# Patient Record
Sex: Female | Born: 1960 | Race: White | Hispanic: No | Smoking: Former smoker
Health system: Southern US, Community
[De-identification: ages and names within clinical notes are randomized; demographics above are authoritative.]

## PROBLEM LIST (undated history)

## (undated) ENCOUNTER — Emergency Department (HOSPITAL_COMMUNITY): Discharge status: Absent without leave | Payer: Medicaid Other

## (undated) DIAGNOSIS — Z8719 Personal history of other diseases of the digestive system: Secondary | ICD-10-CM

## (undated) DIAGNOSIS — M359 Systemic involvement of connective tissue, unspecified: Secondary | ICD-10-CM

## (undated) DIAGNOSIS — Z8673 Personal history of transient ischemic attack (TIA), and cerebral infarction without residual deficits: Secondary | ICD-10-CM

## (undated) DIAGNOSIS — Z9889 Other specified postprocedural states: Secondary | ICD-10-CM

## (undated) DIAGNOSIS — G8929 Other chronic pain: Secondary | ICD-10-CM

## (undated) DIAGNOSIS — B1920 Unspecified viral hepatitis C without hepatic coma: Secondary | ICD-10-CM

## (undated) DIAGNOSIS — F1910 Other psychoactive substance abuse, uncomplicated: Secondary | ICD-10-CM

## (undated) DIAGNOSIS — K219 Gastro-esophageal reflux disease without esophagitis: Secondary | ICD-10-CM

## (undated) DIAGNOSIS — M5431 Sciatica, right side: Secondary | ICD-10-CM

## (undated) DIAGNOSIS — N390 Urinary tract infection, site not specified: Secondary | ICD-10-CM

## (undated) DIAGNOSIS — I10 Essential (primary) hypertension: Secondary | ICD-10-CM

## (undated) DIAGNOSIS — E782 Mixed hyperlipidemia: Secondary | ICD-10-CM

## (undated) DIAGNOSIS — M1990 Unspecified osteoarthritis, unspecified site: Secondary | ICD-10-CM

## (undated) DIAGNOSIS — R7611 Nonspecific reaction to tuberculin skin test without active tuberculosis: Secondary | ICD-10-CM

## (undated) DIAGNOSIS — J45909 Unspecified asthma, uncomplicated: Secondary | ICD-10-CM

## (undated) DIAGNOSIS — G43909 Migraine, unspecified, not intractable, without status migrainosus: Secondary | ICD-10-CM

## (undated) DIAGNOSIS — F419 Anxiety disorder, unspecified: Secondary | ICD-10-CM

## (undated) DIAGNOSIS — F609 Personality disorder, unspecified: Secondary | ICD-10-CM

## (undated) HISTORY — PX: HEMORRHOID BANDING: SHX5850

## (undated) HISTORY — DX: Mixed hyperlipidemia: E78.2

## (undated) HISTORY — DX: Unspecified viral hepatitis C without hepatic coma: B19.20

## (undated) HISTORY — DX: Urinary tract infection, site not specified: N39.0

## (undated) HISTORY — DX: Other specified postprocedural states: Z98.890

## (undated) HISTORY — DX: Nonspecific reaction to tuberculin skin test without active tuberculosis: R76.11

## (undated) HISTORY — PX: CYSTOSCOPY: SUR368

## (undated) HISTORY — DX: Essential (primary) hypertension: I10

## (undated) HISTORY — DX: Anxiety disorder, unspecified: F41.9

## (undated) HISTORY — PX: OTHER SURGICAL HISTORY: SHX169

## (undated) HISTORY — DX: Other psychoactive substance abuse, uncomplicated: F19.10

## (undated) HISTORY — PX: TUBAL LIGATION: SHX77

## (undated) HISTORY — DX: Personality disorder, unspecified: F60.9

## (undated) HISTORY — PX: NOSE SURGERY: SHX723

## (undated) HISTORY — DX: Personal history of transient ischemic attack (TIA), and cerebral infarction without residual deficits: Z86.73

## (undated) HISTORY — DX: Gastro-esophageal reflux disease without esophagitis: K21.9

## (undated) HISTORY — PX: LIVER BIOPSY: SHX301

## (undated) HISTORY — DX: Sciatica, right side: M54.31

---

## 2000-07-09 ENCOUNTER — Ambulatory Visit (HOSPITAL_COMMUNITY): Admission: RE | Admit: 2000-07-09 | Discharge: 2000-07-09 | Payer: Self-pay | Admitting: Obstetrics and Gynecology

## 2000-07-09 ENCOUNTER — Encounter: Payer: Self-pay | Admitting: Obstetrics and Gynecology

## 2000-08-28 ENCOUNTER — Ambulatory Visit (HOSPITAL_COMMUNITY): Admission: RE | Admit: 2000-08-28 | Discharge: 2000-08-28 | Payer: Self-pay | Admitting: Pulmonary Disease

## 2000-09-04 ENCOUNTER — Ambulatory Visit (HOSPITAL_COMMUNITY): Admission: RE | Admit: 2000-09-04 | Discharge: 2000-09-04 | Payer: Self-pay | Admitting: Pulmonary Disease

## 2000-09-05 ENCOUNTER — Ambulatory Visit (HOSPITAL_COMMUNITY): Admission: RE | Admit: 2000-09-05 | Discharge: 2000-09-05 | Payer: Self-pay | Admitting: Pulmonary Disease

## 2001-12-08 ENCOUNTER — Encounter: Payer: Self-pay | Admitting: Internal Medicine

## 2001-12-08 ENCOUNTER — Emergency Department (HOSPITAL_COMMUNITY): Admission: EM | Admit: 2001-12-08 | Discharge: 2001-12-08 | Payer: Self-pay | Admitting: Internal Medicine

## 2002-05-10 ENCOUNTER — Emergency Department (HOSPITAL_COMMUNITY): Admission: EM | Admit: 2002-05-10 | Discharge: 2002-05-10 | Payer: Self-pay | Admitting: Internal Medicine

## 2004-04-13 ENCOUNTER — Ambulatory Visit (HOSPITAL_COMMUNITY): Admission: RE | Admit: 2004-04-13 | Discharge: 2004-04-13 | Payer: Self-pay | Admitting: Pulmonary Disease

## 2005-10-11 ENCOUNTER — Emergency Department (HOSPITAL_COMMUNITY): Admission: EM | Admit: 2005-10-11 | Discharge: 2005-10-11 | Payer: Self-pay | Admitting: Emergency Medicine

## 2007-07-29 DIAGNOSIS — Z9889 Other specified postprocedural states: Secondary | ICD-10-CM

## 2007-07-29 HISTORY — DX: Other specified postprocedural states: Z98.890

## 2007-08-06 ENCOUNTER — Ambulatory Visit: Payer: Self-pay | Admitting: Gastroenterology

## 2007-08-23 ENCOUNTER — Ambulatory Visit (HOSPITAL_COMMUNITY): Admission: RE | Admit: 2007-08-23 | Discharge: 2007-08-23 | Payer: Self-pay | Admitting: Internal Medicine

## 2007-08-23 ENCOUNTER — Ambulatory Visit: Payer: Self-pay | Admitting: Internal Medicine

## 2007-09-30 ENCOUNTER — Emergency Department (HOSPITAL_COMMUNITY): Admission: EM | Admit: 2007-09-30 | Discharge: 2007-09-30 | Payer: Self-pay | Admitting: Emergency Medicine

## 2008-06-10 ENCOUNTER — Emergency Department (HOSPITAL_COMMUNITY): Admission: EM | Admit: 2008-06-10 | Discharge: 2008-06-10 | Payer: Self-pay | Admitting: Emergency Medicine

## 2009-11-29 ENCOUNTER — Emergency Department (HOSPITAL_COMMUNITY): Admission: EM | Admit: 2009-11-29 | Discharge: 2009-11-29 | Payer: Self-pay | Admitting: Emergency Medicine

## 2010-02-27 DIAGNOSIS — B1920 Unspecified viral hepatitis C without hepatic coma: Secondary | ICD-10-CM

## 2010-02-27 HISTORY — DX: Unspecified viral hepatitis C without hepatic coma: B19.20

## 2010-05-12 LAB — DIFFERENTIAL
Basophils Relative: 0 % (ref 0–1)
Eosinophils Absolute: 0 10*3/uL (ref 0.0–0.7)
Monocytes Relative: 3 % (ref 3–12)
Neutrophils Relative %: 80 % — ABNORMAL HIGH (ref 43–77)

## 2010-05-12 LAB — URINALYSIS, ROUTINE W REFLEX MICROSCOPIC
Ketones, ur: >80 mg/dL — AB
Nitrite: NEGATIVE
pH: 6 (ref 5.0–8.0)

## 2010-05-12 LAB — POCT PREGNANCY, URINE: Preg Test, Ur: NEGATIVE

## 2010-05-12 LAB — COMPREHENSIVE METABOLIC PANEL
ALT: 19 U/L (ref 0–35)
Alkaline Phosphatase: 52 U/L (ref 39–117)
CO2: 19 mEq/L (ref 19–32)
Chloride: 99 mEq/L (ref 96–112)
GFR calc non Af Amer: 57 mL/min — ABNORMAL LOW (ref >60)
Glucose, Bld: 97 mg/dL (ref 70–99)
Potassium: 3.2 mEq/L — ABNORMAL LOW (ref 3.5–5.1)
Sodium: 138 mEq/L (ref 135–145)
Total Bilirubin: 1.1 mg/dL (ref 0.3–1.2)
Total Protein: 7.6 g/dL (ref 6.0–8.3)

## 2010-05-12 LAB — CBC
HCT: 45.8 % (ref 36.0–46.0)
Hemoglobin: 15.6 g/dL — ABNORMAL HIGH (ref 12.0–15.0)
RBC: 4.88 MIL/uL (ref 3.87–5.11)
WBC: 8.4 10*3/uL (ref 4.0–10.5)

## 2010-06-08 LAB — COMPREHENSIVE METABOLIC PANEL
BUN: 8 mg/dL (ref 6–23)
CO2: 29 mEq/L (ref 19–32)
Calcium: 9.7 mg/dL (ref 8.4–10.5)
Creatinine, Ser: 0.86 mg/dL (ref 0.4–1.2)
GFR calc non Af Amer: >60 mL/min (ref >60)
Glucose, Bld: 113 mg/dL — ABNORMAL HIGH (ref 70–99)

## 2010-06-08 LAB — CBC
Hemoglobin: 13.8 g/dL (ref 12.0–15.0)
MCHC: 34.9 g/dL (ref 30.0–36.0)
MCV: 89.2 fL (ref 78.0–100.0)
RBC: 4.43 MIL/uL (ref 3.87–5.11)

## 2010-06-08 LAB — DIFFERENTIAL
Lymphocytes Relative: 33 % (ref 12–46)
Lymphs Abs: 2.7 10*3/uL (ref 0.7–4.0)
Neutrophils Relative %: 60 % (ref 43–77)

## 2010-06-08 LAB — TSH: TSH: 3.033 u[IU]/mL (ref 0.350–4.500)

## 2010-07-12 NOTE — Op Note (Signed)
NAMEMAELIN, KURKOWSKI                ACCOUNT NO.:  [id number]   MEDICAL RECORD NO.:  [id number]          PATIENT TYPE:  AMB   LOCATION:  DAY                           FACILITY:  [facility]   PHYSICIAN:  R. Roetta Sessions, M.D. DATE OF BIRTH:  1960-06-11   DATE OF PROCEDURE:  08/23/2007  DATE OF DISCHARGE:                               OPERATIVE REPORT   INDICATIONS FOR PROCEDURE:  A 50 year old lady with several-year history  of intermittent rectal bleeding.  She is occasionally constipated.  She  has put off colonoscopy until now.  Risks, benefits, alternatives,  limitations of this procedure have been reviewed with [name].  Questions answered.  She is agreeable.  Please see the documentation in  the medical record.   PROCEDURE NOTE:  O2 saturation, blood pressure, pulse rate, and  respirations were monitored throughout the entire procedure.   CONSCIOUS SEDATION:  Versed 6 mg IV, Demerol 150 mg IV in divided doses,  Phenergan 25 mg diluted slow IV push to augment conscious sedation.   INSTRUMENT:  Pentax video chip system.   FINDINGS:  Digital rectal exam revealed no abnormalities.  Endoscopic  findings:  The prep was good.  Colon:  Colonic mucosa was surveyed from  the rectosigmoid junction through the left transverse, right colon,  appendiceal orifice, ileocecal valve, and cecum.  These structures were  well seen and photographed for the record.  Terminal ileum was intubated  to 10 cm from this level.  The scope was slowly and cautiously  withdrawn.  All previously mentioned mucosal surfaces were again seen.  The colonic mucosa appeared entirely normal as did the terminal ileal  mucosa.  The scope was pulled down to the rectum with examination of the  rectal mucosa including retroflex view of the anal verge, en face view  of the anal canal demonstrating only friable anal canal hemorrhoids. The  patient tolerated the procedure well and was reactive to endoscopy.   IMPRESSION:   Probably anal canal hemorrhoids, otherwise normal rectum,  terminal ileum.  I suspect the patient bled from hemorrhoids.   RECOMMENDATIONS:  1. Constipation, hemorrhoid literature is provided to [name].  She      should increase her fiber intake with daily      supplement.  Anusol-HC Suppositories one per rectum at bedtime x10      nights.  2. MiraLax 17 g orally at bedtime p.r.n. constipation.  3. Repeat colonoscopy for screening purposes in 10 years.      Jonathon Bellows, M.D.  Electronically Signed     RMR/MEDQ  D:  08/23/2007  T:  08/24/2007  Job:  [id number]

## 2010-07-12 NOTE — Consult Note (Signed)
NAMEJATZIRY, WECHTER                ACCOUNT NO.:  [id number]   MEDICAL RECORD NO.:  [id number]         PATIENT TYPE:  AMB   LOCATION:  DAY                           FACILITY:  [facility]   PHYSICIAN:  R. Roetta Sessions, M.D. DATE OF BIRTH:  [birth year]   DATE OF CONSULTATION:  DATE OF DISCHARGE:                                 CONSULTATION   REASON FOR CONSULTATION:  Rectal bleeding, needs colonoscopy.   PHYSICIAN REQUESTING CONSULTATION:  Edward L. Juanetta Gosling, M.D.   HISTORY OF PRESENT ILLNESS:  [name] is a 50 year old Caucasian  female, who has a several year history of intermittent rectal bleeding.  She states this has been going on for more than 2 years.  She has put  off having a colonoscopy.  Now, she is ready to have one, however.  She  states every time she has a bowel movement, she notes blood when she  wipes.  She often passes what she feels is a large amount of blood per  rectum.  It is usually mixed in the stools well.  Her stools are  anywhere from loose to formed.  At times, she does have constipation.  She states she has half diarrhea, half constipation on a regular basis.  She complains of crampy lower abdominal pain associated with bowel  movements.  The last couple of nights she has had nocturnal stools.  This was associated with vomiting at least on one occasion.  She also  has regular heartburn and takes Prilosec OTC.  She admits to a 60-pound  weight gain over the past year and a half due to poor diet.  She  denies any hematemesis.  She has never had a colonoscopy.  She states  she had colitis several years ago and was treated with medication.  She  denies ever having a colonoscopy or flex sig done.   CURRENT MEDICATIONS:  1. Xanax 1 mg t.i.d.  2. Vicodin 10 mg q.i.d.  3. Vistaril nightly.  4. Prilosec OTC p.r.n.   ALLERGIES:  PENICILLIN CAUSES RASH.   PAST MEDICAL HISTORY:  She has arthritis in her back and chronic back  pain, history of panic attacks, bipolar  disorder, anxiety, history of  colitis as above, details limited, prior tubal ligation.  She has had  surgery on her nose, left wrist, and left arm.   FAMILY HISTORY:  Mother is 73 years old, has dementia and heart  problems.  Father died due to alcoholism and also had prostate cancer.  She is not aware of any family history of colorectal cancer.   SOCIAL HISTORY:  She is separated.  She has a son, who is deceased.  She  smokes one pack of cigarettes daily.  No alcohol use since age 52, but  admits to about a 4-year history of daily alcohol use.   REVIEW OF SYSTEMS:  GI:  See HPI.  CONSTITUTIONAL:  See HPI.  CARDIOPULMONARY:  Denies chest pain or shortness of breath.  No cough or  palpitations.  GENITOURINARY:  Complains of stress urinary incontinence.  No dysuria or hematuria.   PHYSICAL EXAMINATION:  VITAL SIGNS:  Weight 172, height 5 feet 2 inches,  temp 98.4, blood pressure 128/88, and pulse 80.  GENERAL:  Pleasant, obese, Caucasian female in no acute distress.  SKIN:  Warm and dry.  No jaundice.  HEENT:  Sclerae nonicteric.  Oropharyngeal mucosa moist and pink.  No  lesions, erythema, or exudate.  No lymphadenopathy or thyromegaly.  CHEST:  Lungs are clear to auscultation.  CARDIAC:  Regular rate and rhythm.  Normal S1 and S2.  No murmurs, rubs,  or gallops.  ABDOMEN:  Positive bowel sounds.  Abdomen is soft and obese with  symmetrical mild lower abdominal tenderness to deep palpation, left  greater than right lower quadrant.  No rebound or guarding.  No  organomegaly or masses.  No abdominal bruits or hernias.  LOWER EXTREMITIES:  No edema.   IMPRESSION:  Ms. Jenita is a 50 year old lady with chronic hematochezia.  Differential diagnosis is quite broad anywhere from inflammatory bowel  disease, polyps, colorectal cancer, hemorrhoids etc. I have discussed  potential risk with regards to colonoscopy including, but not limited to  the risk of reaction to medication, bleeding,  infection, or perforation  and she is agreeable to proceed.   PLAN:  Colonoscopy with Dr. Jena Gauss in the near future.   I would like to thank Dr. Kari Baars for allowing [place] to take part in  the care of this patient.      Tana Coast, P.AJonathon Bellows, M.D.  Electronically Signed    LL/MEDQ  D:  08/06/2007  T:  08/07/2007  Job:  [id number]   cc:   Ramon Dredge L. Juanetta Gosling, M.D.  Fax: [phone]

## 2010-09-28 DIAGNOSIS — Z9889 Other specified postprocedural states: Secondary | ICD-10-CM

## 2010-09-28 HISTORY — DX: Other specified postprocedural states: Z98.890

## 2010-10-04 ENCOUNTER — Encounter: Payer: Self-pay | Admitting: Gastroenterology

## 2010-10-06 ENCOUNTER — Ambulatory Visit (INDEPENDENT_AMBULATORY_CARE_PROVIDER_SITE_OTHER): Payer: Self-pay | Admitting: Gastroenterology

## 2010-10-06 ENCOUNTER — Encounter: Payer: Self-pay | Admitting: Gastroenterology

## 2010-10-06 DIAGNOSIS — R1013 Epigastric pain: Secondary | ICD-10-CM

## 2010-10-06 DIAGNOSIS — K921 Melena: Secondary | ICD-10-CM

## 2010-10-06 DIAGNOSIS — R112 Nausea with vomiting, unspecified: Secondary | ICD-10-CM | POA: Insufficient documentation

## 2010-10-06 NOTE — Assessment & Plan Note (Signed)
See N/V 

## 2010-10-06 NOTE — Progress Notes (Signed)
agree

## 2010-10-06 NOTE — Assessment & Plan Note (Signed)
Low-volume, intermittent paper hematochezia in the setting of known anal canal hemorrhoids. No change in bowel habits, lower abdominal pain, no FH of colon ca. Last colonoscopy fairly recent in June 2009. No anemia on labs. At this time, will hold off on colonoscopy at time of EGD. Pt may likely need one in the distant future, especially if any clinical changes. She states intermittent episodes are relieved with OTC Preparation H. Due to chronic N/V, will address this first, follow-up in office, and determine timing of next colonoscopy if needed.

## 2010-10-06 NOTE — Progress Notes (Signed)
[facility] 

## 2010-10-06 NOTE — Patient Instructions (Signed)
Continue Omeprazole daily, taking 30 minutes before breakfast each morning.  Eat soft foods, drink liquids/water to stay hydrated.  If you experience any increased abdominal pain, worsened nausea or vomiting, inability to tolerate anything at all by mouth, bloody vomit, then you need to go the nearest emergency room.  We have set you up for an endoscopy with Dr. Jena Gauss. Further recommendations to follow.
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
## 2010-10-06 NOTE — Assessment & Plan Note (Signed)
49 year old Caucasian female, in no apparent distress, with reported hx of chronic N/V since April of this year. Associated with intense, intermittent epigastric pain. Worsened with eating. No hematemesis, no wt loss or lack of appetite. Interestingly, has tolerated foods such as pork chops, mashed potatoes recently. Drinking tea at today's visit. Her labs are essentially normal, without anemia or signs of dehydration. She has had improvement in reflux-type symptoms since taking Prilosec daily. Differentials include gastritis, PUD, uncontrolled GERD, less likely biliary component but unable to be entirely excluded. Doubt gastroparesis, especially in light of epigastric pain. Pt is in no distress at time of visit.   ~Proceed with upper endoscopy, possible dilation if needed, in the near future with Dr. Jena Gauss. The risks, benefits, and alternatives have been discussed in detail with patient. They have stated understanding and desire to proceed.  ~Continue prilosec daily, samples provided ~To ED if worsening of symptoms, inability to tolerate liquids. Pt thoroughly informed on signs/symptoms that would necessitate urgent intervention. ~Further recommendations to follow

## 2010-10-07 ENCOUNTER — Encounter: Payer: Self-pay | Admitting: General Practice

## 2010-10-07 ENCOUNTER — Other Ambulatory Visit: Payer: Self-pay | Admitting: General Practice

## 2010-10-07 DIAGNOSIS — R1013 Epigastric pain: Secondary | ICD-10-CM

## 2010-10-07 DIAGNOSIS — R112 Nausea with vomiting, unspecified: Secondary | ICD-10-CM

## 2010-10-14 MED ORDER — SODIUM CHLORIDE 0.45 % IV SOLN
Freq: Once | INTRAVENOUS | Status: AC
  Administered 2010-10-17: 08:00:00 via INTRAVENOUS

## 2010-10-17 ENCOUNTER — Encounter (HOSPITAL_COMMUNITY)
Admission: RE | Disposition: A | Discharge status: Home / Self Care | Payer: Self-pay | Source: Ambulatory Visit | Attending: Internal Medicine

## 2010-10-17 ENCOUNTER — Encounter (HOSPITAL_COMMUNITY): Payer: Self-pay | Admitting: *Deleted

## 2010-10-17 ENCOUNTER — Ambulatory Visit (HOSPITAL_COMMUNITY)
Admission: RE | Admit: 2010-10-17 | Discharge: 2010-10-17 | Disposition: A | Discharge status: Home / Self Care | Payer: Self-pay | Source: Ambulatory Visit | Attending: Internal Medicine | Admitting: Internal Medicine

## 2010-10-17 ENCOUNTER — Telehealth: Payer: Self-pay

## 2010-10-17 DIAGNOSIS — E7800 Pure hypercholesterolemia, unspecified: Secondary | ICD-10-CM | POA: Insufficient documentation

## 2010-10-17 DIAGNOSIS — R112 Nausea with vomiting, unspecified: Secondary | ICD-10-CM

## 2010-10-17 DIAGNOSIS — K228 Other specified diseases of esophagus: Secondary | ICD-10-CM

## 2010-10-17 DIAGNOSIS — K449 Diaphragmatic hernia without obstruction or gangrene: Secondary | ICD-10-CM | POA: Insufficient documentation

## 2010-10-17 DIAGNOSIS — R1013 Epigastric pain: Secondary | ICD-10-CM

## 2010-10-17 HISTORY — DX: Migraine, unspecified, not intractable, without status migrainosus: G43.909

## 2010-10-17 HISTORY — PX: ESOPHAGOGASTRODUODENOSCOPY: SHX5428

## 2010-10-17 HISTORY — PX: MALONEY DILATION: SHX5535

## 2010-10-17 HISTORY — PX: SAVORY DILATION: SHX5439

## 2010-10-17 SURGERY — EGD (ESOPHAGOGASTRODUODENOSCOPY)
Anesthesia: Moderate Sedation

## 2010-10-17 MED ORDER — MIDAZOLAM HCL 5 MG/5ML IJ SOLN
INTRAMUSCULAR | Status: AC
  Filled 2010-10-17: qty 5

## 2010-10-17 MED ORDER — MEPERIDINE HCL 100 MG/ML IJ SOLN
INTRAMUSCULAR | Status: DC | PRN
  Administered 2010-10-17 (×3): 50 mg via INTRAVENOUS
  Administered 2010-10-17 (×2): 25 mg via INTRAVENOUS

## 2010-10-17 MED ORDER — STERILE WATER FOR IRRIGATION IR SOLN
Status: DC | PRN
  Administered 2010-10-17: 09:00:00

## 2010-10-17 MED ORDER — MEPERIDINE HCL 100 MG/ML IJ SOLN
INTRAMUSCULAR | Status: AC
  Filled 2010-10-17: qty 1

## 2010-10-17 MED ORDER — PROMETHAZINE HCL 25 MG/ML IJ SOLN
INTRAMUSCULAR | Status: AC
  Filled 2010-10-17: qty 1

## 2010-10-17 MED ORDER — MIDAZOLAM HCL 5 MG/5ML IJ SOLN
INTRAMUSCULAR | Status: AC
  Filled 2010-10-17: qty 10

## 2010-10-17 MED ORDER — PROMETHAZINE HCL 25 MG/ML IJ SOLN
INTRAMUSCULAR | Status: DC | PRN
  Administered 2010-10-17: 12.5 mg via INTRAVENOUS

## 2010-10-17 MED ORDER — BUTAMBEN-TETRACAINE-BENZOCAINE 2-2-14 % EX AERO
INHALATION_SPRAY | CUTANEOUS | Status: DC | PRN
  Administered 2010-10-17 (×2): 1 via TOPICAL

## 2010-10-17 MED ORDER — MIDAZOLAM HCL 5 MG/5ML IJ SOLN
INTRAMUSCULAR | Status: DC | PRN
  Administered 2010-10-17: 2 mg via INTRAVENOUS
  Administered 2010-10-17: 1 mg via INTRAVENOUS
  Administered 2010-10-17: 2 mg via INTRAVENOUS
  Administered 2010-10-17: 1 mg via INTRAVENOUS
  Administered 2010-10-17: 2 mg via INTRAVENOUS

## 2010-10-17 NOTE — Telephone Encounter (Signed)
Dr.Rourk called and asked to give patient 3 weeks of Dexilant. She will stop by on her way home from the hospital and I will leave them up front.
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
## 2010-10-20 ENCOUNTER — Encounter (HOSPITAL_COMMUNITY): Payer: Self-pay | Admitting: Internal Medicine

## 2010-10-20 ENCOUNTER — Ambulatory Visit (HOSPITAL_COMMUNITY): Payer: Self-pay

## 2010-10-25 ENCOUNTER — Ambulatory Visit (HOSPITAL_COMMUNITY)
Admission: RE | Admit: 2010-10-25 | Discharge: 2010-10-25 | Disposition: A | Discharge status: Home / Self Care | Payer: Self-pay | Source: Ambulatory Visit | Attending: Internal Medicine | Admitting: Internal Medicine

## 2010-10-25 DIAGNOSIS — R109 Unspecified abdominal pain: Secondary | ICD-10-CM | POA: Insufficient documentation

## 2010-10-25 DIAGNOSIS — R1013 Epigastric pain: Secondary | ICD-10-CM

## 2010-10-27 NOTE — Progress Notes (Signed)
Quick Note:  Normal. How is pt? ______

## 2010-11-03 ENCOUNTER — Other Ambulatory Visit: Payer: Self-pay | Admitting: Internal Medicine

## 2010-11-03 ENCOUNTER — Telehealth: Payer: Self-pay

## 2010-11-03 ENCOUNTER — Encounter: Payer: Self-pay | Admitting: Internal Medicine

## 2010-11-03 NOTE — Telephone Encounter (Signed)
Pt is aware of OV for 10/11 at 10 with AS

## 2010-11-03 NOTE — Telephone Encounter (Signed)
She needs a follow up appointment with one of the extenders

## 2010-11-03 NOTE — Telephone Encounter (Signed)
Please schedule appt for pt. thanks 

## 2010-11-03 NOTE — Telephone Encounter (Signed)
FYI- When I called pt yesterday to discuss U/S results, pt informed me that [facility] and [facility] told her that she had hep c but it was not active and [facility] might want to check her liver. [facility] and [facility] Malachi Paradise[facility] She stated she checked pts chart and there is nothing current or in her history showing pt has hep c. She stated she didn't know where the pt got that info from.

## 2010-11-09 NOTE — Telephone Encounter (Signed)
Pt stated [facility] found her Hep C. Will you please ask them to send [place] any info they have. Thanks.

## 2010-11-10 NOTE — Telephone Encounter (Signed)
Records Requested.

## 2010-12-08 ENCOUNTER — Ambulatory Visit (INDEPENDENT_AMBULATORY_CARE_PROVIDER_SITE_OTHER): Payer: Self-pay | Admitting: Gastroenterology

## 2010-12-08 ENCOUNTER — Encounter: Payer: Self-pay | Admitting: Gastroenterology

## 2010-12-08 VITALS — BP 127/80 | HR 100 | Temp 98.1°F | Ht 62.0 in | Wt 163.2 lb

## 2010-12-08 DIAGNOSIS — R768 Other specified abnormal immunological findings in serum: Secondary | ICD-10-CM

## 2010-12-08 DIAGNOSIS — Z205 Contact with and (suspected) exposure to viral hepatitis: Secondary | ICD-10-CM

## 2010-12-08 DIAGNOSIS — R894 Abnormal immunological findings in specimens from other organs, systems and tissues: Secondary | ICD-10-CM

## 2010-12-08 DIAGNOSIS — Z20828 Contact with and (suspected) exposure to other viral communicable diseases: Secondary | ICD-10-CM

## 2010-12-08 DIAGNOSIS — R631 Polydipsia: Secondary | ICD-10-CM

## 2010-12-08 DIAGNOSIS — R1013 Epigastric pain: Secondary | ICD-10-CM

## 2010-12-08 MED ORDER — PROMETHAZINE HCL 25 MG PO TABS: 25.0000 mg | ORAL_TABLET | Freq: Four times a day (QID) | ORAL | Status: DC | PRN

## 2010-12-08 MED ORDER — DEXLANSOPRAZOLE 60 MG PO CPDR: 60.0000 mg | DELAYED_RELEASE_CAPSULE | Freq: Every day | ORAL | Status: DC

## 2010-12-08 NOTE — Patient Instructions (Signed)
Please have labs drawn. We will call you with results.  Continue Dexilant daily. A prescription has been provided to assist with coverage for medication.  We will see you back in 6 months or sooner if needed.
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
## 2010-12-09 LAB — HEPATIC FUNCTION PANEL
ALT: 20 U/L (ref 0–35)
Alkaline Phosphatase: 72 U/L (ref 39–117)
Indirect Bilirubin: 0.2 mg/dL (ref 0.0–0.9)
Total Protein: 7.3 g/dL (ref 6.0–8.3)

## 2010-12-12 ENCOUNTER — Encounter: Payer: Self-pay | Admitting: Gastroenterology

## 2010-12-12 DIAGNOSIS — B1920 Unspecified viral hepatitis C without hepatic coma: Secondary | ICD-10-CM | POA: Insufficient documentation

## 2010-12-12 DIAGNOSIS — R631 Polydipsia: Secondary | ICD-10-CM | POA: Insufficient documentation

## 2010-12-12 NOTE — Assessment & Plan Note (Signed)
Resolved. Doing well on Dexilant daily. Requested phenergan while working on [employer], drilling approved. Will provide short course. Dexilant samples provided as well. Return in 6 mos.

## 2010-12-12 NOTE — Progress Notes (Unsigned)
Received labs from [facility].   Hep C antibody +.  Need quantitative HCV RNA.   Depending on results, may refer to [facility] for possible treatment.

## 2010-12-12 NOTE — Assessment & Plan Note (Signed)
Received reports. Will be drawing quantitative RNA. Update HFP. Recent US on file from Aug, no abnormalities. If needed, refer to [facility].   6 mos f/u.

## 2010-12-12 NOTE — Assessment & Plan Note (Signed)
Increased thirst, hunger, worried about her blood sugars. Will draw A1c, send to [facility] results.

## 2010-12-13 ENCOUNTER — Other Ambulatory Visit: Payer: Self-pay | Admitting: Gastroenterology

## 2010-12-13 DIAGNOSIS — R768 Other specified abnormal immunological findings in serum: Secondary | ICD-10-CM

## 2010-12-13 NOTE — Progress Notes (Signed)
Pt aware, lab order mailed to pt.  

## 2010-12-13 NOTE — Progress Notes (Signed)
Quick Note:  This all looks great. No diabetes. Needs to f/u with PCP regarding her symptoms. Awaiting quantitative HCV RNA to determine plan. ______

## 2010-12-14 NOTE — Progress Notes (Signed)
Cc to PCP 

## 2010-12-15 ENCOUNTER — Other Ambulatory Visit: Payer: Self-pay | Admitting: Gastroenterology

## 2010-12-16 LAB — HEPATITIS C RNA QUANTITATIVE: HCV Quantitative: 3130000 IU/mL — ABNORMAL HIGH (ref <43)

## 2010-12-20 NOTE — Progress Notes (Signed)
Quick Note:  Please contact pt and inform we did the quantitative labs to assess for active Hep C. She needs to be referred to the Hep C clinic in [place] for treatment. ______

## 2010-12-26 ENCOUNTER — Telehealth: Payer: Self-pay | Admitting: Gastroenterology

## 2010-12-26 NOTE — Telephone Encounter (Signed)
Received confirmation fax from [facility] Clinic- they will contact pt regarding scheduling appt

## 2011-02-07 ENCOUNTER — Other Ambulatory Visit: Payer: Self-pay

## 2011-02-07 DIAGNOSIS — R894 Abnormal immunological findings in specimens from other organs, systems and tissues: Secondary | ICD-10-CM

## 2011-02-07 NOTE — Telephone Encounter (Signed)
Pt is aware of lab work that needs to be done. She is on Dexilant.

## 2011-02-07 NOTE — Telephone Encounter (Signed)
Refused Phenergan refill. Pt should already be on Dexilant. She was given short-term rx until Dexilant approved.  Also, needs AFP now. Will need AFP and [place] of abd q6 mos. Next due Feb for [place].

## 2011-02-13 ENCOUNTER — Other Ambulatory Visit: Payer: Self-pay | Admitting: Gastroenterology

## 2011-02-16 NOTE — Progress Notes (Signed)
Quick Note:  Normal. Repeat 6 mos. ______

## 2011-03-14 ENCOUNTER — Telehealth: Payer: Self-pay | Admitting: Gastroenterology

## 2011-03-14 NOTE — Telephone Encounter (Signed)
Received fax from Hep C clinic- pt is scheduled for 02/28 @ 10:45- pt is aware of appt

## 2011-03-21 ENCOUNTER — Other Ambulatory Visit: Payer: Self-pay | Admitting: Obstetrics and Gynecology

## 2011-03-21 DIAGNOSIS — N920 Excessive and frequent menstruation with regular cycle: Secondary | ICD-10-CM

## 2011-03-24 ENCOUNTER — Other Ambulatory Visit: Payer: Self-pay | Admitting: Obstetrics and Gynecology

## 2011-03-24 ENCOUNTER — Ambulatory Visit (HOSPITAL_COMMUNITY)
Admission: RE | Admit: 2011-03-24 | Discharge: 2011-03-24 | Disposition: A | Discharge status: Home / Self Care | Payer: Self-pay | Source: Ambulatory Visit | Attending: Obstetrics and Gynecology | Admitting: Obstetrics and Gynecology

## 2011-03-24 DIAGNOSIS — R9389 Abnormal findings on diagnostic imaging of other specified body structures: Secondary | ICD-10-CM | POA: Insufficient documentation

## 2011-03-24 DIAGNOSIS — N83209 Unspecified ovarian cyst, unspecified side: Secondary | ICD-10-CM | POA: Insufficient documentation

## 2011-03-24 DIAGNOSIS — N949 Unspecified condition associated with female genital organs and menstrual cycle: Secondary | ICD-10-CM | POA: Insufficient documentation

## 2011-03-24 DIAGNOSIS — N938 Other specified abnormal uterine and vaginal bleeding: Secondary | ICD-10-CM | POA: Insufficient documentation

## 2011-03-24 DIAGNOSIS — N920 Excessive and frequent menstruation with regular cycle: Secondary | ICD-10-CM

## 2011-04-27 ENCOUNTER — Ambulatory Visit (INDEPENDENT_AMBULATORY_CARE_PROVIDER_SITE_OTHER): Payer: Self-pay | Admitting: Gastroenterology

## 2011-04-27 DIAGNOSIS — B182 Chronic viral hepatitis C: Secondary | ICD-10-CM

## 2011-04-28 LAB — COMPLETE METABOLIC PANEL WITH GFR
ALT: 17 U/L (ref 0–35)
AST: 19 U/L (ref 0–37)
Albumin: 4.1 g/dL (ref 3.5–5.2)
Alkaline Phosphatase: 61 U/L (ref 39–117)
GFR, Est Non African American: 81 mL/min
Glucose, Bld: 100 mg/dL — ABNORMAL HIGH (ref 70–99)
Potassium: 4.2 mEq/L (ref 3.5–5.3)
Sodium: 136 mEq/L (ref 135–145)
Total Protein: 6.9 g/dL (ref 6.0–8.3)

## 2011-04-28 LAB — CBC WITH DIFFERENTIAL/PLATELET
Basophils Relative: 0 % (ref 0–1)
Hemoglobin: 10.2 g/dL — ABNORMAL LOW (ref 12.0–15.0)
MCHC: 30.4 g/dL (ref 30.0–36.0)
Monocytes Relative: 3 % (ref 3–12)
Neutro Abs: 6.5 10*3/uL (ref 1.7–7.7)
Neutrophils Relative %: 75 % (ref 43–77)
Platelets: 274 10*3/uL (ref 150–400)
RBC: 4.38 MIL/uL (ref 3.87–5.11)

## 2011-04-28 LAB — AFP TUMOR MARKER: AFP-Tumor Marker: 1.6 ng/mL (ref 0.0–8.0)

## 2011-04-28 LAB — HEPATITIS B CORE ANTIBODY, TOTAL: Hep B Core Total Ab: POSITIVE — AB

## 2011-04-28 LAB — IBC PANEL
%SAT: 8 % — ABNORMAL LOW (ref 20–55)
TIBC: 468 ug/dL (ref 250–470)
UIBC: 432 ug/dL — ABNORMAL HIGH (ref 125–400)

## 2011-04-28 LAB — HEPATITIS B SURFACE ANTIBODY,QUALITATIVE: Hep B S Ab: POSITIVE — AB

## 2011-04-28 LAB — ANA: Anti Nuclear Antibody(ANA): NEGATIVE

## 2011-05-03 LAB — HEPATITIS C GENOTYPE

## 2011-05-04 ENCOUNTER — Other Ambulatory Visit: Payer: Self-pay | Admitting: Gastroenterology

## 2011-05-04 DIAGNOSIS — B182 Chronic viral hepatitis C: Secondary | ICD-10-CM
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
## 2011-05-05 ENCOUNTER — Encounter (HOSPITAL_COMMUNITY): Payer: Self-pay | Admitting: Pharmacy Technician

## 2011-05-10 ENCOUNTER — Ambulatory Visit (INDEPENDENT_AMBULATORY_CARE_PROVIDER_SITE_OTHER): Payer: Self-pay | Admitting: Urgent Care

## 2011-05-10 ENCOUNTER — Telehealth: Payer: Self-pay | Admitting: Gastroenterology

## 2011-05-10 ENCOUNTER — Encounter: Payer: Self-pay | Admitting: Urgent Care

## 2011-05-10 VITALS — BP 129/80 | HR 108 | Temp 97.2°F | Wt 168.4 lb

## 2011-05-10 DIAGNOSIS — K921 Melena: Secondary | ICD-10-CM

## 2011-05-10 DIAGNOSIS — D509 Iron deficiency anemia, unspecified: Secondary | ICD-10-CM

## 2011-05-10 DIAGNOSIS — N912 Amenorrhea, unspecified: Secondary | ICD-10-CM

## 2011-05-10 DIAGNOSIS — R894 Abnormal immunological findings in specimens from other organs, systems and tissues: Secondary | ICD-10-CM

## 2011-05-10 DIAGNOSIS — R9389 Abnormal findings on diagnostic imaging of other specified body structures: Secondary | ICD-10-CM

## 2011-05-10 DIAGNOSIS — R768 Other specified abnormal immunological findings in serum: Secondary | ICD-10-CM

## 2011-05-10 NOTE — Telephone Encounter (Signed)
Referral faxed to [facility]

## 2011-05-10 NOTE — Assessment & Plan Note (Signed)
Given the iron deficiency anemia she is going to need another colonoscopy to rule out colorectal neoplasia.  I have discussed risks & benefits which include, but are not limited to, bleeding, infection, perforation & drug reaction.  The patient agrees with this plan & written consent will be obtained.

## 2011-05-10 NOTE — Progress Notes (Signed)
Faxed to PCP

## 2011-05-10 NOTE — Assessment & Plan Note (Signed)
liver biopsy 3/22 pending

## 2011-05-10 NOTE — Assessment & Plan Note (Signed)
She is planning colonoscopy and possibly EGD if no source of iron deficiency anemia found on colonoscopy.I have discussed risks & benefits which include, but are not limited to, bleeding, infection, perforation & drug reaction.  The patient agrees with this plan & written consent will be obtained.    She also needs follow up with Dr. Emelda Fear regarding her endometrial thickening and possible polyp.  Malignancy needs to be ruled out here as well.

## 2011-05-10 NOTE — Patient Instructions (Signed)
You need colonoscopy and possibly an EGD with Dr. Jena Gauss in the near future Keep follow up w/ Dr Jacqualine Mau for liver biopsy Please see Dr Emelda Fear to follow up with your ultrasound findings
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
## 2011-05-16 ENCOUNTER — Other Ambulatory Visit: Payer: Self-pay | Admitting: Radiology

## 2011-05-19 ENCOUNTER — Encounter (HOSPITAL_COMMUNITY): Payer: Self-pay

## 2011-05-19 ENCOUNTER — Ambulatory Visit (HOSPITAL_COMMUNITY)
Admission: RE | Admit: 2011-05-19 | Discharge: 2011-05-19 | Disposition: A | Discharge status: Home / Self Care | Payer: Self-pay | Source: Ambulatory Visit | Attending: Gastroenterology | Admitting: Gastroenterology

## 2011-05-19 DIAGNOSIS — F411 Generalized anxiety disorder: Secondary | ICD-10-CM | POA: Insufficient documentation

## 2011-05-19 DIAGNOSIS — K219 Gastro-esophageal reflux disease without esophagitis: Secondary | ICD-10-CM | POA: Insufficient documentation

## 2011-05-19 DIAGNOSIS — B182 Chronic viral hepatitis C: Secondary | ICD-10-CM

## 2011-05-19 DIAGNOSIS — B1920 Unspecified viral hepatitis C without hepatic coma: Secondary | ICD-10-CM | POA: Insufficient documentation

## 2011-05-19 DIAGNOSIS — F17200 Nicotine dependence, unspecified, uncomplicated: Secondary | ICD-10-CM | POA: Insufficient documentation

## 2011-05-19 LAB — PROTIME-INR
INR: 0.94 (ref 0.00–1.49)
Prothrombin Time: 12.8 seconds (ref 11.6–15.2)

## 2011-05-19 LAB — CBC
Hemoglobin: 10.3 g/dL — ABNORMAL LOW (ref 12.0–15.0)
MCH: 23.6 pg — ABNORMAL LOW (ref 26.0–34.0)
MCHC: 31.6 g/dL (ref 30.0–36.0)
Platelets: 183 10*3/uL (ref 150–400)
RBC: 4.37 MIL/uL (ref 3.87–5.11)

## 2011-05-19 LAB — APTT: aPTT: 29 seconds (ref 24–37)

## 2011-05-19 MED ORDER — HYDROCODONE-ACETAMINOPHEN 5-325 MG PO TABS
1.0000 | ORAL_TABLET | ORAL | Status: DC | PRN
  Administered 2011-05-19: 1 via ORAL
  Filled 2011-05-19: qty 1

## 2011-05-19 MED ORDER — FENTANYL CITRATE 0.05 MG/ML IJ SOLN
INTRAMUSCULAR | Status: AC
  Filled 2011-05-19: qty 4

## 2011-05-19 MED ORDER — MIDAZOLAM HCL 5 MG/5ML IJ SOLN
INTRAMUSCULAR | Status: AC | PRN
  Administered 2011-05-19 (×2): 2 mg via INTRAVENOUS

## 2011-05-19 MED ORDER — ALPRAZOLAM 1 MG PO TABS
1.0000 mg | ORAL_TABLET | Freq: Once | ORAL | Status: DC
  Filled 2011-05-19: qty 1

## 2011-05-19 MED ORDER — FENTANYL CITRATE 0.05 MG/ML IJ SOLN
INTRAMUSCULAR | Status: AC | PRN
  Administered 2011-05-19 (×3): 50 ug via INTRAVENOUS

## 2011-05-19 MED ORDER — ALPRAZOLAM 0.25 MG PO TABS
ORAL_TABLET | ORAL | Status: AC
  Administered 2011-05-19: 1 mg
  Filled 2011-05-19: qty 4

## 2011-05-19 MED ORDER — MIDAZOLAM HCL 2 MG/2ML IJ SOLN
INTRAMUSCULAR | Status: AC
  Filled 2011-05-19: qty 4

## 2011-05-19 MED ORDER — SODIUM CHLORIDE 0.9 % IV SOLN
Freq: Once | INTRAVENOUS | Status: AC
  Administered 2011-05-19: 09:00:00 via INTRAVENOUS

## 2011-05-19 NOTE — ED Notes (Signed)
O2 2L [place] 

## 2011-05-19 NOTE — Procedures (Signed)
Technically successful US guided biopsy of right lobe of liver.  No immediate complications.   

## 2011-05-19 NOTE — ED Notes (Signed)
Pt to nurses station awaiting bed placement on short stay

## 2011-05-19 NOTE — Discharge Instructions (Signed)
Liver Biopsy Care After These instructions give you information on caring for yourself after your procedure. Your doctor may also give you more specific instructions. Call your doctor if you have any problems or questions after your procedure. HOME CARE  Watch for bleeding at your biopsy site.   No heavy lifting, pushing, or pulling for 48 hours (2 days).   No exercise, jogging, or sex for 48 hours (2 days).   Do not drive or use heavy machinery for 24 hours (1 day).   Go back to your usual diet and medicines as told by your doctor.   Do not take the bandage off until the next morning.   Only take medicine as told by your doctor.   Do not shower or bathe until the next day.  GET HELP RIGHT AWAY IF:  You have shortness of breath or trouble breathing.   You have pain or cramping in your belly (abdomen).   You feel sick to your stomach (nauseous) or throw up (vomit).   Bleeding does not stop from the place where the needle was put in. Press on the place that is bleeding until you are checked in the Emergency Room.   Yellowish white fluid (pus) is coming from the place where the needle was put in.   You have any unusual pain that will not stop.   You have puffiness (swelling) or redness at the place where the needle was put in, or if the place is very sore or hot when you touch it.   You have a fever of more than 102 F (38.9 C) for 2 or more days.   You have black, smelly poops (bowel movements).  If you go to the Emergency Room, tell the nurse that you had a liver biopsy. Take this paper with you and show it to the nurse. Keep your follow-up appointment. MAKE SURE YOU:  Understand these instructions.   Will watch your condition.   Will get help right away if you are not doing well or get worse.  Document Released: 11/23/2007 Document Revised: 02/02/2011 Document Reviewed: 11/23/2007 [employer] Patient Information 2012 [employer]. 
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
## 2011-05-22 ENCOUNTER — Telehealth (HOSPITAL_COMMUNITY): Payer: Self-pay

## 2011-05-23 MED ORDER — SODIUM CHLORIDE 0.45 % IV SOLN
Freq: Once | INTRAVENOUS | Status: AC
  Administered 2011-05-24: 09:00:00 via INTRAVENOUS

## 2011-05-24 ENCOUNTER — Encounter (HOSPITAL_COMMUNITY): Payer: Self-pay | Admitting: *Deleted

## 2011-05-24 ENCOUNTER — Encounter (HOSPITAL_COMMUNITY)
Admission: RE | Disposition: A | Discharge status: Home / Self Care | Payer: Self-pay | Source: Ambulatory Visit | Attending: Internal Medicine

## 2011-05-24 ENCOUNTER — Ambulatory Visit (HOSPITAL_COMMUNITY)
Admission: RE | Admit: 2011-05-24 | Discharge: 2011-05-24 | Disposition: A | Discharge status: Home / Self Care | Payer: Self-pay | Source: Ambulatory Visit | Attending: Internal Medicine | Admitting: Internal Medicine

## 2011-05-24 DIAGNOSIS — D509 Iron deficiency anemia, unspecified: Secondary | ICD-10-CM

## 2011-05-24 DIAGNOSIS — K921 Melena: Secondary | ICD-10-CM

## 2011-05-24 DIAGNOSIS — R9389 Abnormal findings on diagnostic imaging of other specified body structures: Secondary | ICD-10-CM

## 2011-05-24 DIAGNOSIS — D126 Benign neoplasm of colon, unspecified: Secondary | ICD-10-CM

## 2011-05-24 DIAGNOSIS — E7800 Pure hypercholesterolemia, unspecified: Secondary | ICD-10-CM | POA: Insufficient documentation

## 2011-05-24 DIAGNOSIS — K644 Residual hemorrhoidal skin tags: Secondary | ICD-10-CM | POA: Insufficient documentation

## 2011-05-24 DIAGNOSIS — K449 Diaphragmatic hernia without obstruction or gangrene: Secondary | ICD-10-CM | POA: Insufficient documentation

## 2011-05-24 DIAGNOSIS — K5730 Diverticulosis of large intestine without perforation or abscess without bleeding: Secondary | ICD-10-CM

## 2011-05-24 DIAGNOSIS — R768 Other specified abnormal immunological findings in serum: Secondary | ICD-10-CM

## 2011-05-24 HISTORY — PX: COLONOSCOPY: SHX5424

## 2011-05-24 HISTORY — PX: ESOPHAGOGASTRODUODENOSCOPY: SHX5428

## 2011-05-24 SURGERY — COLONOSCOPY
Anesthesia: Moderate Sedation

## 2011-05-24 MED ORDER — PROMETHAZINE HCL 25 MG/ML IJ SOLN
INTRAMUSCULAR | Status: AC
  Filled 2011-05-24: qty 1

## 2011-05-24 MED ORDER — MEPERIDINE HCL 100 MG/ML IJ SOLN
INTRAMUSCULAR | Status: DC | PRN
  Administered 2011-05-24 (×3): 25 mg via INTRAVENOUS
  Administered 2011-05-24 (×2): 50 mg via INTRAVENOUS

## 2011-05-24 MED ORDER — BUTAMBEN-TETRACAINE-BENZOCAINE 2-2-14 % EX AERO
INHALATION_SPRAY | CUTANEOUS | Status: DC | PRN
  Administered 2011-05-24: 2 via TOPICAL

## 2011-05-24 MED ORDER — MIDAZOLAM HCL 5 MG/5ML IJ SOLN
INTRAMUSCULAR | Status: AC
  Filled 2011-05-24: qty 10

## 2011-05-24 MED ORDER — STERILE WATER FOR IRRIGATION IR SOLN
Status: DC | PRN
  Administered 2011-05-24: 10:00:00

## 2011-05-24 MED ORDER — MEPERIDINE HCL 100 MG/ML IJ SOLN
INTRAMUSCULAR | Status: AC
  Filled 2011-05-24: qty 2

## 2011-05-24 MED ORDER — PROMETHAZINE HCL 25 MG/ML IJ SOLN
25.0000 mg | Freq: Once | INTRAMUSCULAR | Status: AC
  Administered 2011-05-24: 25 mg via INTRAVENOUS

## 2011-05-24 MED ORDER — MIDAZOLAM HCL 5 MG/5ML IJ SOLN
INTRAMUSCULAR | Status: DC | PRN
  Administered 2011-05-24: 1 mg via INTRAVENOUS
  Administered 2011-05-24 (×3): 2 mg via INTRAVENOUS
  Administered 2011-05-24 (×3): 1 mg via INTRAVENOUS

## 2011-05-24 MED ORDER — PROMETHAZINE HCL 25 MG/ML IJ SOLN: 12.5000 mg | Freq: Once | INTRAMUSCULAR | Status: DC

## 2011-05-24 MED ORDER — SODIUM CHLORIDE 0.9 % IJ SOLN
INTRAMUSCULAR | Status: AC
  Filled 2011-05-24: qty 10
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
## 2011-05-24 NOTE — Op Note (Signed)
[facility] [address] [place], [place]  [address]  COLONOSCOPY PROCEDURE REPORT  PATIENT:  [name], [name]  MR#:  [id number] BIRTHDATE:  03/29/1960, 50 yrs. old  GENDER:  female ENDOSCOPIST:  R. Roetta Sessions, MD FACP [facility] REF. BY:  Kari Baars, M.D. PROCEDURE DATE:  05/24/2011 PROCEDURE:  Ileocolonoscopy with biopsy  INDICATIONS:  Hematochezia; iron deficiency anemia  INFORMED CONSENT:  The risks, benefits, alternatives and imponderables including but not limited to bleeding, perforation as well as the possibility of a missed lesion have been reviewed. The potential for biopsy, lesion removal, etc. have also been discussed.  Questions have been answered.  All parties agreeable. Please see the history and physical in the medical record for more information.  MEDICATIONS:  Demerol 150 mg IV and Versed 8 mg IV in divided doses. Cetacaine spray. Phenergan 25 mg IV to augment conscious sedation  DESCRIPTION OF PROCEDURE:  After a digital rectal exam was performed, the EC-[id number] ([id number]) colonoscope was advanced from the anus through the rectum and colon to the area of the cecum, ileocecal valve and appendiceal orifice.  The cecum was deeply intubated.  These structures were well-seen and photographed for the record.  From the level of the cecum and ileocecal valve, the scope was slowly and cautiously withdrawn.  The mucosal surfaces were carefully surveyed utilizing scope tip deflection to facilitate fold flattening as needed.  The scope was pulled down into the rectum where a thorough examination including retroflexion was performed. <<PROCEDUREIMAGES>>  FINDINGS: Adequate preparation. Anal canal hemorrhoids and anal papilla;  otherwise, normal rectum. Left-sided diverticulosis. Single diminutive         polyp in the mid descending colon; remainder of the colonic mucosa and distal 10 cm of terminal ileal mucosa appeared normal.  THERAPEUTIC / DIAGNOSTIC  MANEUVERS PERFORMED:  The descending colon polyps cold biopsied/removed  COMPLICATIONS:  None  CECAL WITHDRAWAL TIME:  14 minutes  IMPRESSION:  Anal papilla and hemorrhoids. Colonic diverticulosis. Single diminutive descending colon polyp-removed as described above.                  Remainder colon and terminal ileum appeared normal.  RECOMMENDATIONS:    See EGD report  ______________________________ R. Roetta Sessions, MD Caleen Essex  CC:  Kari Baars, M.D.  n. eSIGNED:   R. Roetta Sessions at 05/24/2011 10:53 AM  [name], [phone]

## 2011-05-24 NOTE — Discharge Instructions (Signed)
Esophagogastroduodenoscopy This is an endoscopic procedure (a procedure that uses a device like a flexible telescope) that allows your caregiver to view the upper stomach and small bowel. This test allows your caregiver to look at the esophagus. The esophagus carries food from your mouth to your stomach. They can also look at your duodenum. This is the first part of the small intestine that attaches to the stomach. This test is used to detect problems in the bowel such as ulcers and inflammation. PREPARATION FOR TEST Nothing to eat after midnight the day before the test. NORMAL FINDINGS Normal esophagus, stomach, and duodenum. Ranges for normal findings may vary among different laboratories and hospitals. You should always check with your doctor after having lab work or other tests done to discuss the meaning of your test results and whether your values are considered within normal limits. MEANING OF TEST  Your caregiver will go over the test results with you and discuss the importance and meaning of your results, as well as treatment options and the need for additional tests if necessary. OBTAINING THE TEST RESULTS It is your responsibility to obtain your test results. Ask the lab or department performing the test when and how you will get your results. Document Released: 06/16/2004 Document Revised: 02/02/2011 Document Reviewed: 01/24/2008 [facility] Patient Information 2012 [place], [place]. Colonoscopy A colonoscopy is an exam to evaluate your entire colon. In this exam, your colon is cleansed. A long fiberoptic tube is inserted through your rectum and into your colon. The fiberoptic scope (endoscope) is a long bundle of enclosed and very flexible fibers. These fibers transmit light to the area examined and send images from that area to your caregiver. Discomfort is usually minimal. You may be given a drug to help you sleep (sedative) during or prior to the procedure. This exam helps to detect lumps  (tumors), polyps, inflammation, and areas of bleeding. Your caregiver may also take a small piece of tissue (biopsy) that will be examined under a microscope. LET YOUR CAREGIVER KNOW ABOUT:   Allergies to food or medicine.   Medicines taken, including vitamins, herbs, eyedrops, over-the-counter medicines, and creams.   Use of steroids (by mouth or creams).   Previous problems with anesthetics or numbing medicines.   History of bleeding problems or blood clots.   Previous surgery.   Other health problems, including diabetes and kidney problems.   Possibility of pregnancy, if this applies.  BEFORE THE PROCEDURE   A clear liquid diet may be required for 2 days before the exam.   Ask your caregiver about changing or stopping your regular medications.   Liquid injections (enemas) or laxatives may be required.   A large amount of electrolyte solution may be given to you to drink over a short period of time. This solution is used to clean out your colon.   You should be present 60 minutes prior to your procedure or as directed by your caregiver.  AFTER THE PROCEDURE   If you received a sedative or pain relieving medication, you will need to arrange for someone to drive you home.   Occasionally, there is a little blood passed with the first bowel movement. Do not be concerned.  FINDING OUT THE RESULTS OF YOUR TEST Not all test results are available during your visit. If your test results are not back during the visit, make an appointment with your caregiver to find out the results. Do not assume everything is normal if you have not heard from your caregiver  or the medical facility. It is important for you to follow up on all of your test results. HOME CARE INSTRUCTIONS   It is not unusual to pass moderate amounts of gas and experience mild abdominal cramping following the procedure. This is due to air being used to inflate your colon during the exam. Walking or a warm pack on your belly  (abdomen) may help.   You may resume all normal meals and activities after sedatives and medicines have worn off.   Only take over-the-counter or prescription medicines for pain, discomfort, or fever as directed by your caregiver. Do not use aspirin or blood thinners if a biopsy was taken. Consult your caregiver for medicine usage if biopsies were taken.  SEEK IMMEDIATE MEDICAL CARE IF:   You have a fever.   You pass large blood clots or fill a toilet with blood following the procedure. This may also occur 10 to 14 days following the procedure. This is more likely if a biopsy was taken.   You develop abdominal pain that keeps getting worse and cannot be relieved with medicine.  Document Released: 02/11/2000 Document Revised: 02/02/2011 Document Reviewed: 09/26/2007 [facility] Patient Information 2012 [place], [place].  EGD Discharge instructions Please read the instructions outlined below and refer to this sheet in the next few weeks. These discharge instructions provide you with general information on caring for yourself after you leave the hospital. Your doctor may also give you specific instructions. While your treatment has been planned according to the most current medical practices available, unavoidable complications occasionally occur. If you have any problems or questions after discharge, please call your doctor. ACTIVITY  You may resume your regular activity but move at a slower pace for the next 24 hours.   Take frequent rest periods for the next 24 hours.   Walking will help expel (get rid of) the air and reduce the bloated feeling in your abdomen.   No driving for 24 hours (because of the anesthesia (medicine) used during the test).   You may shower.   Do not sign any important legal documents or operate any machinery for 24 hours (because of the anesthesia used during the test).  NUTRITION  Drink plenty of fluids.   You may resume your normal diet.   Begin with a light  meal and progress to your normal diet.   Avoid alcoholic beverages for 24 hours or as instructed by your caregiver.  MEDICATIONS  You may resume your normal medications unless your caregiver tells you otherwise.  WHAT YOU CAN EXPECT TODAY  You may experience abdominal discomfort such as a feeling of fullness or "gas" pains.  FOLLOW-UP  Your doctor will discuss the results of your test with you.  SEEK IMMEDIATE MEDICAL ATTENTION IF ANY OF THE FOLLOWING OCCUR:  Excessive nausea (feeling sick to your stomach) and/or vomiting.   Severe abdominal pain and distention (swelling).   Trouble swallowing.   Temperature over 101 F (37.8 C).   Rectal bleeding or vomiting of blood.   Colonoscopy Discharge Instructions  Read the instructions outlined below and refer to this sheet in the next few weeks. These discharge instructions provide you with general information on caring for yourself after you leave the hospital. Your doctor may also give you specific instructions. While your treatment has been planned according to the most current medical practices available, unavoidable complications occasionally occur. If you have any problems or questions after discharge, call Dr. Jena Gauss at [phone]. ACTIVITY  You may resume your  regular activity, but move at a slower pace for the next 24 hours.   Take frequent rest periods for the next 24 hours.   Walking will help get rid of the air and reduce the bloated feeling in your belly (abdomen).   No driving for 24 hours (because of the medicine (anesthesia) used during the test).    Do not sign any important legal documents or operate any machinery for 24 hours (because of the anesthesia used during the test).  NUTRITION  Drink plenty of fluids.   You may resume your normal diet as instructed by your doctor.   Begin with a light meal and progress to your normal diet. Heavy or fried foods are harder to digest and may make you feel sick to your  stomach (nauseated).   Avoid alcoholic beverages for 24 hours or as instructed.  MEDICATIONS  You may resume your normal medications unless your doctor tells you otherwise.  WHAT YOU CAN EXPECT TODAY  Some feelings of bloating in the abdomen.   Passage of more gas than usual.   Spotting of blood in your stool or on the toilet paper.  IF YOU HAD POLYPS REMOVED DURING THE COLONOSCOPY:  No aspirin products for 7 days or as instructed.   No alcohol for 7 days or as instructed.   Eat a soft diet for the next 24 hours.  FINDING OUT THE RESULTS OF YOUR TEST Not all test results are available during your visit. If your test results are not back during the visit, make an appointment with your caregiver to find out the results. Do not assume everything is normal if you have not heard from your caregiver or the medical facility. It is important for you to follow up on all of your test results.  SEEK IMMEDIATE MEDICAL ATTENTION IF:  You have more than a spotting of blood in your stool.   Your belly is swollen (abdominal distention).   You are nauseated or vomiting.   You have a temperature over 101.   You have abdominal pain or discomfort that is severe or gets worse throughout the day.     Diverticulosis and polyp and hemorrhoid information provided.  Daily fiber supplement.  Anusol suppositories as directed   Further recommendations to follow pending review of pathology report.

## 2011-05-24 NOTE — Op Note (Signed)
[name], [name]                ACCOUNT NO.:  [id number]  MEDICAL RECORD NO.:  [id number]  LOCATION:  [facility]                          FACILITY:  [facility]  PHYSICIAN:  R. Roetta Sessions, MD FACP FACGDATE OF BIRTH:  Nov 12, 1960  DATE OF PROCEDURE:  05/24/2011 DATE OF DISCHARGE:                              OPERATIVE REPORT   SURGEON:  R. Roetta Sessions, MD FACP [facility]  INDICATIONS FOR PROCEDURE:  A 51 year old lady with profound iron deficiency anemia and hematochezia.  Colonoscopy was just performed without significant findings (see report dictated separately).  EGD is now being done.  Risks, benefits, limitations, alternatives, and imponderables have been discussed, and questions answered.  Please see the documentation medical record.  PROCEDURE NOTE:  O2 saturation, blood pressure, pulse, respirations were monitored throughout the entire procedure.  CONSCIOUS SEDATION:  Versed 10 mg IV and Demerol 175 mg IV in divided doses, Phenergan 25 mg IV to augment conscious sedation.  Cetacaine spray topical pharyngeal anesthesia.  INSTRUMENT:  Instrument Pentax Video Chip System.  FINDINGS:  Examination of tubular esophagus revealed no mucosal abnormalities.  There were no esophageal varices.  EG junction easily traversed.  Stomach:  Empty, insufflated well with air.  Thorough examination of the gastric mucosa including retroflexed proximal stomach, esophagogastric junction demonstrated only a small hiatal hernia.  There was no portal gastropathy or other abnormality.  Pylorus was patent, easily traversed.  Examination of the bulb and second portion as well as the third portion revealed no abnormalities. Therapeutic/diagnostic maneuvers performed:  Biopsies of the second and third portion of the duodenum were taken for histologic study.  The patient tolerated the procedure well.  IMPRESSION:  Small hiatal hernia, otherwise negative exam, status post biopsy of the  duodenum.  RECOMMENDATIONS:  Hemorrhoid literature provided to [name].  Follow up on polyps and small-bowel biopsy, daily fiber supplement, 1 week course of Anusol suppositories.  Further recommendations to follow.     Jonathon Bellows, MD FACP [facility]     RMR/MEDQ  D:  05/24/2011  T:  05/24/2011  Job:  [id number]  cc:   Ramon Dredge L. Juanetta Gosling, M.D. Fax: [phone]  Brooke Dare, MD Fax: [phone]

## 2011-05-24 NOTE — Interval H&P Note (Signed)
History and Physical Interval Note:  05/24/2011 10:12 AM  [name]  has presented today for surgery, with the diagnosis of IDA, hematochezia  The various methods of treatment have been discussed with the patient and family. After consideration of risks, benefits and other options for treatment, the patient has consented to  Procedure(s) (LRB): COLONOSCOPY (N/A) ESOPHAGOGASTRODUODENOSCOPY (EGD) (N/A) as a surgical intervention .  The patients' history has been reviewed, patient examined, no change in status, stable for surgery.  I have reviewed the patients' chart and labs.  Questions were answered to the patient's satisfaction.     [name]

## 2011-05-27 ENCOUNTER — Encounter: Payer: Self-pay | Admitting: Internal Medicine

## 2011-05-29 ENCOUNTER — Encounter (HOSPITAL_COMMUNITY): Payer: Self-pay | Admitting: Internal Medicine

## 2011-06-02 ENCOUNTER — Other Ambulatory Visit: Payer: Self-pay | Admitting: Internal Medicine

## 2011-06-02 ENCOUNTER — Encounter: Payer: Self-pay | Admitting: Gastroenterology

## 2011-06-02 ENCOUNTER — Encounter: Payer: Self-pay | Admitting: Internal Medicine

## 2011-06-02 DIAGNOSIS — D509 Iron deficiency anemia, unspecified: Secondary | ICD-10-CM

## 2011-06-02 NOTE — Progress Notes (Unsigned)
Lab order on file and ifobt reminder.  Darl Pikes, please schedule appt in 6 weeks.   Per RMR:  Letter from: Corbin Ade Reason for Letter: Results Review Send letter to patient.  Send copy of letter with path to referring provider and PCP.   NEEDS ANOTHER IFOBT AND OV W CBC IN ABOUT 6 WEEKS

## 2011-06-05 ENCOUNTER — Telehealth: Payer: Self-pay | Admitting: Urgent Care

## 2011-06-05 NOTE — Telephone Encounter (Signed)
In the ER over the weekend did not receive any nausea medication and shes requesting phenergan sent to [facility]

## 2011-06-05 NOTE — Telephone Encounter (Signed)
Please patient I do not see ER note in Epic Where did she go? Please triaged to see why she needs Phenergan. If this is a new problem she is going to need an office visit first. Thanks

## 2011-06-05 NOTE — Telephone Encounter (Signed)
Tried to call pt- NA 

## 2011-06-06 NOTE — Telephone Encounter (Signed)
Tried to call pt- NA 

## 2011-06-07 NOTE — Telephone Encounter (Signed)
Tried to call pt- NA Have tried to call pt several times and am not getting an answer.

## 2011-06-08 NOTE — Telephone Encounter (Signed)
Let's send a letter to try and schedule an appointment. Thank

## 2011-06-13 ENCOUNTER — Encounter: Payer: Self-pay | Admitting: Internal Medicine

## 2011-06-13 NOTE — Progress Notes (Signed)
Unable to reach patient by phone, mailed letter for patient to call office to set up OV

## 2011-06-13 NOTE — Telephone Encounter (Signed)
Mailed letter for patient to call office and set up OV.

## 2011-06-15 ENCOUNTER — Telehealth: Payer: Self-pay | Admitting: Internal Medicine

## 2011-06-15 NOTE — Telephone Encounter (Signed)
Pt was returning your call after receiving letter in the mail. I set up OV for her to come in on 5/3 at 1030 with KJ and she is to call back later this afternoon for RMR's nurse.

## 2011-06-26 ENCOUNTER — Other Ambulatory Visit: Payer: Self-pay | Admitting: Obstetrics & Gynecology

## 2011-06-26 ENCOUNTER — Other Ambulatory Visit: Payer: Self-pay

## 2011-06-26 DIAGNOSIS — D509 Iron deficiency anemia, unspecified: Secondary | ICD-10-CM

## 2011-06-27 ENCOUNTER — Telehealth: Payer: Self-pay | Admitting: Internal Medicine

## 2011-06-27 NOTE — Telephone Encounter (Signed)
Patient is asking for something for nausea and she has threw up a couple of times called to [facility] in [place] please advise

## 2011-06-28 HISTORY — PX: ENDOMETRIAL ABLATION: SHX621

## 2011-06-30 ENCOUNTER — Encounter (HOSPITAL_COMMUNITY): Admission: RE | Admit: 2011-06-30 | Payer: Self-pay | Source: Ambulatory Visit

## 2011-06-30 ENCOUNTER — Ambulatory Visit: Payer: Self-pay | Admitting: Urgent Care

## 2011-07-06 ENCOUNTER — Encounter (HOSPITAL_COMMUNITY): Payer: Self-pay | Admitting: Pharmacy Technician

## 2011-07-07 ENCOUNTER — Other Ambulatory Visit: Payer: Self-pay

## 2011-07-07 ENCOUNTER — Encounter (HOSPITAL_COMMUNITY)
Admission: RE | Admit: 2011-07-07 | Discharge: 2011-07-07 | Disposition: A | Discharge status: Home / Self Care | Payer: Self-pay | Source: Ambulatory Visit | Attending: Obstetrics & Gynecology | Admitting: Obstetrics & Gynecology

## 2011-07-07 ENCOUNTER — Encounter (HOSPITAL_COMMUNITY): Payer: Self-pay

## 2011-07-07 ENCOUNTER — Ambulatory Visit (INDEPENDENT_AMBULATORY_CARE_PROVIDER_SITE_OTHER): Payer: Self-pay | Admitting: Urgent Care

## 2011-07-07 DIAGNOSIS — D509 Iron deficiency anemia, unspecified: Secondary | ICD-10-CM

## 2011-07-07 LAB — CBC
HCT: 37.3 % (ref 36.0–46.0)
MCH: 23.8 pg — ABNORMAL LOW (ref 26.0–34.0)
MCV: 77.1 fL — ABNORMAL LOW (ref 78.0–100.0)
RDW: 17.6 % — ABNORMAL HIGH (ref 11.5–15.5)
WBC: 5.2 10*3/uL (ref 4.0–10.5)

## 2011-07-07 LAB — COMPREHENSIVE METABOLIC PANEL
Albumin: 4 g/dL (ref 3.5–5.2)
BUN: 9 mg/dL (ref 6–23)
CO2: 25 mEq/L (ref 19–32)
Calcium: 10.3 mg/dL (ref 8.4–10.5)
Chloride: 99 mEq/L (ref 96–112)
Creatinine, Ser: 0.79 mg/dL (ref 0.50–1.10)
GFR calc non Af Amer: >90 mL/min (ref >90)
Total Bilirubin: 0.3 mg/dL (ref 0.3–1.2)

## 2011-07-07 LAB — HCG, QUANTITATIVE, PREGNANCY: hCG, Beta Chain, Quant, S: 5 m[IU]/mL — ABNORMAL HIGH (ref <5)

## 2011-07-07 LAB — IFOBT (OCCULT BLOOD): IFOBT: NEGATIVE

## 2011-07-07 LAB — TYPE AND SCREEN
ABO/RH(D): A POS
Antibody Screen: NEGATIVE

## 2011-07-07 NOTE — Progress Notes (Signed)
Quick Note:  Ordered by RMR. ______ 

## 2011-07-07 NOTE — Patient Instructions (Addendum)
Endometrial Ablation Endometrial ablation removes the lining of the uterus (endometrium). It is usually a same day, outpatient treatment. Ablation helps avoid major surgery (such as a hysterectomy). A hysterectomy is removal of the cervix and uterus. Endometrial ablation has less risk and complications, has a shorter recovery period and is less expensive. After endometrial ablation, most women will have little or no menstrual bleeding. You may not keep your fertility. Pregnancy is no longer likely after this procedure but if you are pre-menopausal, you still need to use a reliable method of birth control following the procedure because pregnancy can occur. REASONS TO HAVE THE PROCEDURE MAY INCLUDE:  Heavy periods.   Bleeding that is causing anemia.   Anovulatory bleeding, very irregular, bleeding.   Bleeding submucous fibroids (on the lining inside the uterus) if they are smaller than 3 centimeters.  REASONS NOT TO HAVE THE PROCEDURE MAY INCLUDE:  You wish to have more children.   You have a pre-cancerous or cancerous problem. The cause of any abnormal bleeding must be diagnosed before having the procedure.   You have pain coming from the uterus.   You have a submucus fibroid larger than 3 centimeters.   You recently had a baby.   You recently had an infection in the uterus.   You have a severe retro-flexed, tipped uterus and cannot insert the instrument to do the ablation.   You had a Cesarean section or deep major surgery on the uterus.   The inner cavity of the uterus is too large for the endometrial ablation instrument.  RISKS AND COMPLICATIONS   Perforation of the uterus.   Bleeding.   Infection of the uterus, bladder or vagina.   Injury to surrounding organs.   Cutting the cervix.   An air bubble to the lung (air embolus).   Pregnancy following the procedure.   Failure of the procedure to help the problem requiring hysterectomy.   Decreased ability to diagnose  cancer in the lining of the uterus.  BEFORE THE PROCEDURE  The lining of the uterus must be tested to make sure there is no pre-cancerous or cancer cells present.   Medications may be given to make the lining of the uterus thinner.   Ultrasound may be used to evaluate the size and look for abnormalities of the uterus.   Future pregnancy is not desired.  PROCEDURE  There are different ways to destroy the lining of the uterus.   Resectoscope - radio frequency-alternating electric current is the most common one used.   Cryotherapy - freezing the lining of the uterus.   Heated Free Liquid - heated salt (saline) solution inserted into the uterus.   Microwave - uses high energy microwaves in the uterus.   Thermal Balloon - a catheter with a balloon tip is inserted into the uterus and filled with heated fluid.  Your caregiver will talk with you about the method used in this clinic. They will also instruct you on the pros and cons of the procedure. Endometrial ablation is performed along with a procedure called operative hysteroscopy. A narrow viewing tube is inserted through the birth canal (vagina) and through the cervix into the uterus. A tiny camera attached to the viewing tube (hysteroscope) allows the uterine cavity to be shown on a TV monitor during surgery. Your uterus is filled with a harmless liquid to make the procedure easier. The lining of the uterus is then removed. The lining can also be removed with a resectoscope which allows your surgeon   to cut away the lining of the uterus under direct vision. Usually, you will be able to go home within an hour after the procedure. HOME CARE INSTRUCTIONS  Do not drive for 24 hours. [name]  07/07/2011   Your procedure is scheduled on:   07/12/2011  Report to [facility] at  930  AM.  Call this number if you have problems the morning of surgery: [phone]   Remember:   Do not eat food:After Midnight.  May have clear liquids:until  Midnight .   Take these medicines the morning of surgery with A SIP OF WATER:  Xanax,adderall,flexaril,dexilant,prilosec   Do not wear jewelry, make-up or nail polish.  Do not wear lotions, powders, or perfumes. You may wear deodorant.  Do not shave 48 hours prior to surgery.  Do not bring valuables to the hospital.  Contacts, dentures or bridgework may not be worn into surgery.  Leave suitcase in the car. After surgery it may be brought to your room.  For patients admitted to the hospital, checkout time is 11:00 AM the day of discharge.   Patients discharged the day of surgery will not be allowed to drive home.  Name and phone number of your driver: family  Special Instructions: CHG Shower Use Special Wash: 1/2 bottle night before surgery and 1/2 bottle morning of surgery.    Please read over the following fact sheets that you were given: Pain Booklet, MRSA Information, Surgical Site Infection Prevention, Anesthesia Post-op Instructions and Care and Recovery After Surgery   No tampons, douching or intercourse for 2 weeks or until your caregiver approves.   Rest at home for 24 to 48 hours. You may then resume normal activities unless told differently by your caregiver.   Take your temperature two times a day for 4 days, and record it.   Take any medications your caregiver has ordered, as directed.   Use some form of contraception if you are pre-menopausal and do not want to get pregnant.  Bleeding after the procedure is normal. It varies from light spotting and mildly watery to bloody discharge for 4 to 6 weeks. You may also have mild cramping. Only take over-the-counter or prescription medicines for pain, discomfort, or fever as directed by your caregiver. Do not use aspirin, as this may aggravate bleeding. Frequent urination during the first 24 hours is normal. You will not know how effective your surgery is until at least 3 months after the surgery. SEEK IMMEDIATE MEDICAL CARE IF:    Bleeding is heavier than a normal menstrual cycle.   An oral temperature above 102 F (38.9 C) develops.   You have increasing cramps or pains not relieved with medication or develop belly (abdominal) pain which does not seem to be related to the same area of earlier cramping and pain.   You are light headed, weak or have fainting episodes.   You develop pain in the shoulder strap areas.   You have chest or leg pain.   You have abnormal vaginal discharge.   You have painful urination.  Document Released: 12/24/2003 Document Revised: 02/02/2011 Document Reviewed: 03/23/2007 [facility] Patient Information 2012 [place], [place].Dilation and Curettage or Vacuum Curettage Dilation and curettage (D&C) and vacuum curettage are minor procedures. A D&C involves stretching (dilation) the cervix and scraping (curettage) the inside lining of the womb (uterus). During a D&C, tissue is gently scraped from the inside lining of the uterus. During a vacuum curettage, the lining and tissue in the uterus are  removed with the use of gentle suction. Curettage may be performed for diagnostic or therapeutic purposes. As a diagnostic procedure, curettage is performed for the purpose of examining tissues from the uterus. Tissue examination may help determine causes or treatment options for symptoms. A diagnostic curettage may be performed for the following symptoms:  Irregular bleeding in the uterus.   Bleeding with the development of clots.   Spotting between menstrual periods.   Prolonged menstrual periods.   Bleeding after menopause.   No menstrual period (amenorrhea).   A change in size and shape of the uterus.  A therapeutic curettage is performed to remove tissue, blood, or a contraceptive device. Therapeutic curettage may be performed for the following conditions:   Removal of an IUD (intrauterine device).   Removal of retained placenta after giving birth. Retained placenta can cause bleeding  severe enough to require transfusions or an infection.   Abortion.   Miscarriage.   Removal of polyps inside the uterus.   Removal of uncommon types of fibroids (noncancerous lumps).  LET YOUR CAREGIVER KNOW ABOUT:   Allergies to food or medicine.   Medicines taken, including vitamins, herbs, eyedrops, over-the-counter medicines, and creams.   Use of steroids (by mouth or creams).   Previous problems with anesthetics or numbing medicines.   History of bleeding problems or blood clots.   Previous surgery.   Other health problems, including diabetes and kidney problems.   Possibility of pregnancy, if this applies.  RISKS AND COMPLICATIONS   Excessive bleeding.   Infection of the uterus.   Damage to the cervix.   Development of scar tissue (adhesions) inside the uterus, later causing abnormal amounts of menstrual bleeding.   Complications from the general anesthetic, if a general anesthetic is used.   Putting a hole (perforation) in the uterus. This is rare.  BEFORE THE PROCEDURE   Eat and drink before the procedure only as directed by your caregiver.   Arrange for someone to take you home.  PROCEDURE   This procedure may be done in a hospital, outpatient clinic, or caregiver's office.   You may be given a general anesthetic or a local anesthetic in and around the cervix.   You will lie on your back with your legs in stirrups.   There are two ways in which your cervix can be softened and dilated. These include:   Taking a medicine.   Having thin rods (laminaria) inserted into your cervix.   A curved tool (curette) will scrape cells from the inside lining of the uterus and will then be removed.  This procedure usually takes about 15 to 30 minutes. AFTER THE PROCEDURE   You will rest in the recovery area until you are stable and are ready to go home.   You will need to have someone take you home.   You may feel sick to your stomach (nauseous) or throw up  (vomit) if you had general anesthesia.   You may have a sore throat if a tube was placed in your throat during general anesthesia.   You may have light cramping and bleeding for 2 days to 2 weeks after the procedure.   Your uterus needs to make a new lining after the procedure. This may make your next period late.  Document Released: 02/13/2005 Document Revised: 02/02/2011 Document Reviewed: 09/11/2008 [facility] Patient Information 2012 [place], [place].PATIENT INSTRUCTIONS POST-ANESTHESIA  IMMEDIATELY FOLLOWING SURGERY:  Do not drive or operate machinery for the first twenty four hours after surgery.  Do not make any important decisions for twenty four hours after surgery or while taking narcotic pain medications or sedatives.  If you develop intractable nausea and vomiting or a severe headache please notify your doctor immediately.  FOLLOW-UP:  Please make an appointment with your surgeon as instructed. You do not need to follow up with anesthesia unless specifically instructed to do so.  WOUND CARE INSTRUCTIONS (if applicable):  Keep a dry clean dressing on the anesthesia/puncture wound site if there is drainage.  Once the wound has quit draining you may leave it open to air.  Generally you should leave the bandage intact for twenty four hours unless there is drainage.  If the epidural site drains for more than 36-48 hours please call the anesthesia department.  QUESTIONS?:  Please feel free to call your physician or the hospital operator if you have any questions, and they will be happy to assist you.     [facility] Anesthesia Department [address] [place] [phone]

## 2011-07-10 ENCOUNTER — Other Ambulatory Visit: Payer: Self-pay

## 2011-07-10 DIAGNOSIS — D649 Anemia, unspecified: Secondary | ICD-10-CM

## 2011-07-10 NOTE — Pre-Procedure Instructions (Signed)
Dr. Despina Hidden aware of elevated HCG.  MD states lab is ok & is ok to proceed with procedure.  No orders given.

## 2011-07-12 ENCOUNTER — Encounter (HOSPITAL_COMMUNITY): Payer: Self-pay | Admitting: Anesthesiology

## 2011-07-12 ENCOUNTER — Ambulatory Visit (HOSPITAL_COMMUNITY)
Admission: RE | Admit: 2011-07-12 | Discharge: 2011-07-12 | Disposition: A | Discharge status: Home / Self Care | Payer: Self-pay | Source: Ambulatory Visit | Attending: Obstetrics & Gynecology | Admitting: Obstetrics & Gynecology

## 2011-07-12 ENCOUNTER — Encounter (HOSPITAL_COMMUNITY)
Admission: RE | Disposition: A | Discharge status: Home / Self Care | Payer: Self-pay | Source: Ambulatory Visit | Attending: Obstetrics & Gynecology

## 2011-07-12 ENCOUNTER — Ambulatory Visit (HOSPITAL_COMMUNITY): Payer: Self-pay | Admitting: Anesthesiology

## 2011-07-12 ENCOUNTER — Encounter (HOSPITAL_COMMUNITY): Payer: Self-pay | Admitting: *Deleted

## 2011-07-12 DIAGNOSIS — Z9889 Other specified postprocedural states: Secondary | ICD-10-CM

## 2011-07-12 DIAGNOSIS — Z01812 Encounter for preprocedural laboratory examination: Secondary | ICD-10-CM | POA: Insufficient documentation

## 2011-07-12 DIAGNOSIS — R9389 Abnormal findings on diagnostic imaging of other specified body structures: Secondary | ICD-10-CM | POA: Insufficient documentation

## 2011-07-12 DIAGNOSIS — N924 Excessive bleeding in the premenopausal period: Secondary | ICD-10-CM | POA: Insufficient documentation

## 2011-07-12 DIAGNOSIS — Z01810 Encounter for preprocedural cardiovascular examination: Secondary | ICD-10-CM | POA: Insufficient documentation

## 2011-07-12 DIAGNOSIS — E7800 Pure hypercholesterolemia, unspecified: Secondary | ICD-10-CM | POA: Insufficient documentation

## 2011-07-12 SURGERY — DILATATION & CURETTAGE/HYSTEROSCOPY WITH THERMACHOICE ABLATION
Anesthesia: General | Wound class: Clean Contaminated

## 2011-07-12 MED ORDER — PROPOFOL 10 MG/ML IV BOLUS
INTRAVENOUS | Status: DC | PRN
  Administered 2011-07-12: 150 mg via INTRAVENOUS

## 2011-07-12 MED ORDER — ONDANSETRON HCL 4 MG/2ML IJ SOLN: 4.0000 mg | Freq: Once | INTRAMUSCULAR | Status: DC | PRN

## 2011-07-12 MED ORDER — CLINDAMYCIN PHOSPHATE 900 MG/50ML IV SOLN: 900.0000 mg | INTRAVENOUS | Status: DC

## 2011-07-12 MED ORDER — 0.9 % SODIUM CHLORIDE (POUR BTL) OPTIME
TOPICAL | Status: DC | PRN
  Administered 2011-07-12: 200 mL

## 2011-07-12 MED ORDER — CLINDAMYCIN PHOSPHATE 900 MG/50ML IV SOLN
INTRAVENOUS | Status: AC
  Filled 2011-07-12: qty 50

## 2011-07-12 MED ORDER — ROCURONIUM BROMIDE 100 MG/10ML IV SOLN
INTRAVENOUS | Status: DC | PRN
  Administered 2011-07-12: 30 mg via INTRAVENOUS

## 2011-07-12 MED ORDER — GLYCOPYRROLATE 0.2 MG/ML IJ SOLN
INTRAMUSCULAR | Status: AC
  Filled 2011-07-12: qty 3

## 2011-07-12 MED ORDER — CLINDAMYCIN PHOSPHATE 600 MG/50ML IV SOLN
INTRAVENOUS | Status: DC | PRN
  Administered 2011-07-12: 900 mg via INTRAVENOUS

## 2011-07-12 MED ORDER — MIDAZOLAM HCL 2 MG/2ML IJ SOLN
1.0000 mg | INTRAMUSCULAR | Status: DC | PRN
  Administered 2011-07-12: 2 mg via INTRAVENOUS

## 2011-07-12 MED ORDER — GLYCOPYRROLATE 0.2 MG/ML IJ SOLN
INTRAMUSCULAR | Status: DC | PRN
  Administered 2011-07-12: 0.6 mg via INTRAVENOUS

## 2011-07-12 MED ORDER — DEXTROSE 5 % IV SOLN
INTRAVENOUS | Status: DC | PRN
  Administered 2011-07-12: 17 mL via INTRAVENOUS

## 2011-07-12 MED ORDER — LACTATED RINGERS IV SOLN
INTRAVENOUS | Status: DC
  Administered 2011-07-12: 1000 mL via INTRAVENOUS

## 2011-07-12 MED ORDER — FENTANYL CITRATE 0.05 MG/ML IJ SOLN
INTRAMUSCULAR | Status: AC
  Administered 2011-07-12: 50 ug via INTRAVENOUS
  Filled 2011-07-12: qty 2

## 2011-07-12 MED ORDER — CIPROFLOXACIN IN D5W 400 MG/200ML IV SOLN: 400.0000 mg | INTRAVENOUS | Status: DC

## 2011-07-12 MED ORDER — KETOROLAC TROMETHAMINE 30 MG/ML IJ SOLN
INTRAMUSCULAR | Status: AC
  Administered 2011-07-12: 30 mg via INTRAVENOUS
  Filled 2011-07-12: qty 1

## 2011-07-12 MED ORDER — NEOSTIGMINE METHYLSULFATE 1 MG/ML IJ SOLN
INTRAMUSCULAR | Status: DC | PRN
  Administered 2011-07-12: 4 mg via INTRAVENOUS

## 2011-07-12 MED ORDER — CIPROFLOXACIN IN D5W 400 MG/200ML IV SOLN
INTRAVENOUS | Status: AC
  Filled 2011-07-12: qty 200

## 2011-07-12 MED ORDER — HYDROCODONE-ACETAMINOPHEN 5-500 MG PO TABS: 1.0000 | ORAL_TABLET | Freq: Four times a day (QID) | ORAL | Status: AC | PRN

## 2011-07-12 MED ORDER — SODIUM CHLORIDE 0.9 % IR SOLN
Status: DC | PRN
  Administered 2011-07-12: 3000 mL

## 2011-07-12 MED ORDER — MIDAZOLAM HCL 2 MG/2ML IJ SOLN
INTRAMUSCULAR | Status: AC
  Filled 2011-07-12: qty 2

## 2011-07-12 MED ORDER — FENTANYL CITRATE 0.05 MG/ML IJ SOLN
INTRAMUSCULAR | Status: DC | PRN
  Administered 2011-07-12 (×2): 50 ug via INTRAVENOUS

## 2011-07-12 MED ORDER — NEOSTIGMINE METHYLSULFATE 1 MG/ML IJ SOLN
INTRAMUSCULAR | Status: AC
  Filled 2011-07-12: qty 10

## 2011-07-12 MED ORDER — FENTANYL CITRATE 0.05 MG/ML IJ SOLN
25.0000 ug | INTRAMUSCULAR | Status: DC | PRN
  Administered 2011-07-12 (×3): 50 ug via INTRAVENOUS

## 2011-07-12 MED ORDER — KETOROLAC TROMETHAMINE 30 MG/ML IJ SOLN
30.0000 mg | Freq: Once | INTRAMUSCULAR | Status: AC
  Administered 2011-07-12: 30 mg via INTRAVENOUS

## 2011-07-12 MED ORDER — LIDOCAINE HCL 1 % IJ SOLN
INTRAMUSCULAR | Status: DC | PRN
  Administered 2011-07-12: 40 mg via INTRADERMAL

## 2011-07-12 MED ORDER — KETOROLAC TROMETHAMINE 10 MG PO TABS: 10.0000 mg | ORAL_TABLET | Freq: Three times a day (TID) | ORAL | Status: AC | PRN

## 2011-07-12 MED ORDER — CIPROFLOXACIN IN D5W 400 MG/200ML IV SOLN
INTRAVENOUS | Status: DC | PRN
  Administered 2011-07-12: 400 mg via INTRAVENOUS

## 2011-07-12 MED ORDER — FENTANYL CITRATE 0.05 MG/ML IJ SOLN
INTRAMUSCULAR | Status: AC
  Administered 2011-07-12: 50 ug via INTRAVENOUS
  Filled 2011-07-12: qty 5

## 2011-07-12 SURGICAL SUPPLY — 30 items
BAG DECANTER FOR FLEXI CONT (MISCELLANEOUS) ×2 IMPLANT
BAG HAMPER (MISCELLANEOUS) ×2 IMPLANT
CATH THERMACHOICE III (CATHETERS) ×2 IMPLANT
CLOTH BEACON ORANGE TIMEOUT ST (SAFETY) ×2 IMPLANT
COVER LIGHT HANDLE STERIS (MISCELLANEOUS) ×4 IMPLANT
FORMALIN 10 PREFIL 120ML (MISCELLANEOUS) ×2 IMPLANT
GAUZE SPONGE 4X4 16PLY XRAY LF (GAUZE/BANDAGES/DRESSINGS) ×2 IMPLANT
GLOVE BIOGEL PI IND STRL 7.5 (GLOVE) ×1 IMPLANT
GLOVE BIOGEL PI IND STRL 8 (GLOVE) ×1 IMPLANT
GLOVE BIOGEL PI INDICATOR 7.5 (GLOVE) ×1
GLOVE BIOGEL PI INDICATOR 8 (GLOVE) ×1
GLOVE ECLIPSE 7.0 STRL STRAW (GLOVE) ×2 IMPLANT
GLOVE ECLIPSE 8.0 STRL XLNG CF (GLOVE) ×2 IMPLANT
GLOVE EXAM NITRILE MD LF STRL (GLOVE) ×2 IMPLANT
GOWN STRL REIN XL XLG (GOWN DISPOSABLE) ×4 IMPLANT
INST SET HYSTEROSCOPY (KITS) ×2 IMPLANT
IV D5W 500ML (IV SOLUTION) ×2 IMPLANT
IV NS IRRIG 3000ML ARTHROMATIC (IV SOLUTION) ×2 IMPLANT
KIT ROOM TURNOVER APOR (KITS) ×2 IMPLANT
MANIFOLD NEPTUNE II (INSTRUMENTS) ×2 IMPLANT
MARKER SKIN DUAL TIP RULER LAB (MISCELLANEOUS) ×2 IMPLANT
NS IRRIG 1000ML POUR BTL (IV SOLUTION) ×2 IMPLANT
PACK BASIC III (CUSTOM PROCEDURE TRAY) ×2
PACK SRG BSC III STRL LF ECLPS (CUSTOM PROCEDURE TRAY) ×1 IMPLANT
PAD ARMBOARD 7.5X6 YLW CONV (MISCELLANEOUS) ×2 IMPLANT
PAD TELFA 3X4 1S STER (GAUZE/BANDAGES/DRESSINGS) ×2 IMPLANT
SET BASIN LINEN APH (SET/KITS/TRAYS/PACK) ×2 IMPLANT
SET IRRIG Y TYPE TUR BLADDER L (SET/KITS/TRAYS/PACK) ×2 IMPLANT
SHEET LAVH (DRAPES) ×2 IMPLANT
YANKAUER SUCT BULB TIP 10FT TU (MISCELLANEOUS) ×2 IMPLANT

## 2011-07-12 NOTE — Anesthesia Preprocedure Evaluation (Signed)
Anesthesia Evaluation  Patient identified by MRN, date of birth, ID band Patient awake    Reviewed: Allergy & Precautions, H&P , NPO status , Patient's Chart, lab work & pertinent test results  Airway Mallampati: II      Dental  (+) Teeth Intact   Pulmonary shortness of breath and with exertion, Current Smoker,  breath sounds clear to auscultation        Cardiovascular - anginaRhythm:Regular     Neuro/Psych  Headaches, PSYCHIATRIC DISORDERS Anxiety    GI/Hepatic GERD-  Medicated,(+) Hepatitis -, C  Endo/Other    Renal/GU      Musculoskeletal   Abdominal   Peds  Hematology   Anesthesia Other Findings   Reproductive/Obstetrics                           Anesthesia Physical Anesthesia Plan  ASA: II  Anesthesia Plan: General   Post-op Pain Management:    Induction: Intravenous, Rapid sequence and Cricoid pressure planned  Airway Management Planned: Oral ETT  Additional Equipment:   Intra-op Plan:   Post-operative Plan: Extubation in OR  Informed Consent: I have reviewed the patients History and Physical, chart, labs and discussed the procedure including the risks, benefits and alternatives for the proposed anesthesia with the patient or authorized representative who has indicated his/her understanding and acceptance.     Plan Discussed with:   Anesthesia Plan Comments:         Anesthesia Quick Evaluation

## 2011-07-12 NOTE — Transfer of Care (Signed)
Immediate Anesthesia Transfer of Care Note  Patient: [name]  Procedure(s) Performed: Procedure(s) (LRB): DILATATION & CURETTAGE/HYSTEROSCOPY WITH THERMACHOICE ABLATION (N/A)  Patient Location: PACU  Anesthesia Type: General  Level of Consciousness: awake, alert , oriented and patient cooperative  Airway & Oxygen Therapy: Patient Spontanous Breathing and Patient connected to face mask oxygen  Post-op Assessment: Report given to PACU RN and Post -op Vital signs reviewed and stable  Post vital signs: Reviewed and stable  Complications: No apparent anesthesia complications

## 2011-07-12 NOTE — Anesthesia Postprocedure Evaluation (Signed)
  Anesthesia Post-op Note  Patient: [name]  Procedure(s) Performed: Procedure(s) (LRB): DILATATION & CURETTAGE/HYSTEROSCOPY WITH THERMACHOICE ABLATION (N/A)  Patient Location: PACU  Anesthesia Type: General  Level of Consciousness: awake, alert , oriented and patient cooperative  Airway and Oxygen Therapy: Patient Spontanous Breathing and Patient connected to face mask oxygen  Post-op Pain: none  Post-op Assessment: Post-op Vital signs reviewed, Patient's Cardiovascular Status Stable, Respiratory Function Stable and Patent Airway  Post-op Vital Signs: Reviewed and stable  Complications: No apparent anesthesia complications
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
## 2011-07-12 NOTE — Op Note (Signed)
Preoperative diagnosis: Perimenopausal bleeding with thickened endometrium  Postoperative diagnoses: Same as above   Procedure: Hysteroscopy, uterine curettage, endometrial ablation  Surgeon: Despina Hidden MD  Anesthesia: Laryngeal mask airway  Findings: The endometrium was normal. There were no fibroid or other abnormalities.  There was a small polyp.  Description of operation: The patient was taken to the operating room and placed in the supine position. She underwent general anesthesia using the laryngeal mask airway. She was placed in the dorsal lithotomy position and prepped and draped in the usual sterile fashion. A Graves speculum was placed and the anterior cervical lip was grasped with a single-tooth tenaculum. The cervix was dilated serially to allow passage of the hysteroscope. Diagnostic hysteroscopy was performed and was found to be normal. A vigorous uterine curettage was then performed and all tissue sent to pathology for evaluation. The ThermaChoice 3 endometrial ablation balloon was then used were 17 cc of D5W was required to maintain a pressure of 190-200 mm of mercury throughout the procedure. Toatl therapy time was 9:32.  All of the equipment worked well throughout the procedure. All of the fluid was returned at the end of the procedure. The patient was awakened from anesthesia and taken to the recovery room in good stable condition all counts were correct. She received 400 of cipro and 600 of cleocinf and 30 mg of Toradol preoperatively. She will be discharged from the recovery room and followed up in the office in 2 weeks.                                           Lazaro Arms 07/12/2011 12:18 PM

## 2011-07-12 NOTE — Discharge Instructions (Signed)
Endometrial Ablation Endometrial ablation removes the lining of the uterus (endometrium). It is usually a same day, outpatient treatment. Ablation helps avoid major surgery (such as a hysterectomy). A hysterectomy is removal of the cervix and uterus. Endometrial ablation has less risk and complications, has a shorter recovery period and is less expensive. After endometrial ablation, most women will have little or no menstrual bleeding. You may not keep your fertility. Pregnancy is no longer likely after this procedure but if you are pre-menopausal, you still need to use a reliable method of birth control following the procedure because pregnancy can occur. REASONS TO HAVE THE PROCEDURE MAY INCLUDE:  Heavy periods.   Bleeding that is causing anemia.   Anovulatory bleeding, very irregular, bleeding.   Bleeding submucous fibroids (on the lining inside the uterus) if they are smaller than 3 centimeters.  REASONS NOT TO HAVE THE PROCEDURE MAY INCLUDE:  You wish to have more children.   You have a pre-cancerous or cancerous problem. The cause of any abnormal bleeding must be diagnosed before having the procedure.   You have pain coming from the uterus.   You have a submucus fibroid larger than 3 centimeters.   You recently had a baby.   You recently had an infection in the uterus.   You have a severe retro-flexed, tipped uterus and cannot insert the instrument to do the ablation.   You had a Cesarean section or deep major surgery on the uterus.   The inner cavity of the uterus is too large for the endometrial ablation instrument.  RISKS AND COMPLICATIONS   Perforation of the uterus.   Bleeding.   Infection of the uterus, bladder or vagina.   Injury to surrounding organs.   Cutting the cervix.   An air bubble to the lung (air embolus).   Pregnancy following the procedure.   Failure of the procedure to help the problem requiring hysterectomy.   Decreased ability to diagnose  cancer in the lining of the uterus.  BEFORE THE PROCEDURE  The lining of the uterus must be tested to make sure there is no pre-cancerous or cancer cells present.   Medications may be given to make the lining of the uterus thinner.   Ultrasound may be used to evaluate the size and look for abnormalities of the uterus.   Future pregnancy is not desired.  PROCEDURE  There are different ways to destroy the lining of the uterus.   Resectoscope - radio frequency-alternating electric current is the most common one used.   Cryotherapy - freezing the lining of the uterus.   Heated Free Liquid - heated salt (saline) solution inserted into the uterus.   Microwave - uses high energy microwaves in the uterus.   Thermal Balloon - a catheter with a balloon tip is inserted into the uterus and filled with heated fluid.  Your caregiver will talk with you about the method used in this clinic. They will also instruct you on the pros and cons of the procedure. Endometrial ablation is performed along with a procedure called operative hysteroscopy. A narrow viewing tube is inserted through the birth canal (vagina) and through the cervix into the uterus. A tiny camera attached to the viewing tube (hysteroscope) allows the uterine cavity to be shown on a TV monitor during surgery. Your uterus is filled with a harmless liquid to make the procedure easier. The lining of the uterus is then removed. The lining can also be removed with a resectoscope which allows your surgeon   to cut away the lining of the uterus under direct vision. Usually, you will be able to go home within an hour after the procedure. HOME CARE INSTRUCTIONS   Do not drive for 24 hours.   No tampons, douching or intercourse for 2 weeks or until your caregiver approves.   Rest at home for 24 to 48 hours. You may then resume normal activities unless told differently by your caregiver.   Take your temperature two times a day for 4 days, and record  it.   Take any medications your caregiver has ordered, as directed.   Use some form of contraception if you are pre-menopausal and do not want to get pregnant.  Bleeding after the procedure is normal. It varies from light spotting and mildly watery to bloody discharge for 4 to 6 weeks. You may also have mild cramping. Only take over-the-counter or prescription medicines for pain, discomfort, or fever as directed by your caregiver. Do not use aspirin, as this may aggravate bleeding. Frequent urination during the first 24 hours is normal. You will not know how effective your surgery is until at least 3 months after the surgery. SEEK IMMEDIATE MEDICAL CARE IF:   Bleeding is heavier than a normal menstrual cycle.   An oral temperature above 102 F (38.9 C) develops.   You have increasing cramps or pains not relieved with medication or develop belly (abdominal) pain which does not seem to be related to the same area of earlier cramping and pain.   You are light headed, weak or have fainting episodes.   You develop pain in the shoulder strap areas.   You have chest or leg pain.   You have abnormal vaginal discharge.   You have painful urination.  Document Released: 12/24/2003 Document Revised: 02/02/2011 Document Reviewed: 03/23/2007 [employer] Patient Information 2012 [employer]. 

## 2011-07-12 NOTE — Anesthesia Procedure Notes (Signed)
Procedure Name: Intubation Date/Time: 07/12/2011 11:58 AM Performed by: Glynn Octave E Pre-anesthesia Checklist: Patient identified, Patient being monitored, Timeout performed, Emergency Drugs available and Suction available Patient Re-evaluated:Patient Re-evaluated prior to inductionOxygen Delivery Method: Circle System Utilized Preoxygenation: Pre-oxygenation with 100% oxygen Intubation Type: IV induction, Rapid sequence and Cricoid Pressure applied Ventilation: Mask ventilation without difficulty Laryngoscope Size: Mac and 3 Grade View: Grade I Tube type: Oral Tube size: 7.0 mm Number of attempts: 1 Airway Equipment and Method: stylet Placement Confirmation: ETT inserted through vocal cords under direct vision,  positive ETCO2 and breath sounds checked- equal and bilateral Secured at: 21 cm Tube secured with: Tape Dental Injury: Teeth and Oropharynx as per pre-operative assessment

## 2011-07-20 ENCOUNTER — Telehealth: Payer: Self-pay | Admitting: Gastroenterology

## 2011-07-20 ENCOUNTER — Telehealth: Payer: Self-pay | Admitting: Internal Medicine

## 2011-07-20 NOTE — Telephone Encounter (Signed)
Pt called to ask if we received any blood work and if she needed a follow up OV. She hung up while I had her on hold looking. Please advise

## 2011-07-20 NOTE — Telephone Encounter (Signed)
Yes, needs FU OV.  I don't see what happened to 5/3 appt?  Maybe cancelled?

## 2011-07-20 NOTE — Telephone Encounter (Signed)
Pt also left voicemail- she is asking about her preop labs. We have preop labs from March, 2013 prior to TCS. Pt had procedure done by Dr. Despina Hidden May 2013 and they also did preop labs. Tried to call pt back, NA.   Does pt need follow up visit?

## 2011-07-20 NOTE — Telephone Encounter (Signed)
On 07/13/11, Ms [name] said she would have Dr Rosalia Hammers at [facility] [phone]) fax office notes.  As of signing I have not seen any notes.

## 2011-07-21 ENCOUNTER — Encounter: Payer: Self-pay | Admitting: Internal Medicine

## 2011-07-21 NOTE — Telephone Encounter (Signed)
Pt is aware of OV on 6/14 at 1030 with KJ and appt card was mailed

## 2011-07-25 ENCOUNTER — Other Ambulatory Visit (HOSPITAL_COMMUNITY): Payer: Self-pay

## 2011-08-03 ENCOUNTER — Encounter: Payer: Self-pay | Admitting: Internal Medicine

## 2011-08-03 ENCOUNTER — Encounter (HOSPITAL_COMMUNITY): Payer: Self-pay

## 2011-08-10 ENCOUNTER — Ambulatory Visit (HOSPITAL_COMMUNITY)
Admission: RE | Admit: 2011-08-10 | Discharge: 2011-08-10 | Disposition: A | Discharge status: Home / Self Care | Payer: No Typology Code available for payment source | Source: Ambulatory Visit | Attending: Pulmonary Disease | Admitting: Pulmonary Disease

## 2011-08-10 NOTE — Progress Notes (Signed)
POWER TO THE COMPUTER WAS LOST. [name] ONLY COMPLETED THE PRE FVC, AND DLCO PORTION OF TEST.

## 2011-08-11 ENCOUNTER — Encounter: Payer: Self-pay | Admitting: Urgent Care

## 2011-08-11 ENCOUNTER — Ambulatory Visit (INDEPENDENT_AMBULATORY_CARE_PROVIDER_SITE_OTHER): Payer: Self-pay | Admitting: Urgent Care

## 2011-08-11 VITALS — BP 122/80 | HR 100 | Temp 98.2°F | Ht 62.0 in | Wt 170.2 lb

## 2011-08-11 DIAGNOSIS — K219 Gastro-esophageal reflux disease without esophagitis: Secondary | ICD-10-CM | POA: Insufficient documentation

## 2011-08-11 DIAGNOSIS — B1920 Unspecified viral hepatitis C without hepatic coma: Secondary | ICD-10-CM

## 2011-08-11 DIAGNOSIS — D509 Iron deficiency anemia, unspecified: Secondary | ICD-10-CM
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
## 2011-08-11 NOTE — Assessment & Plan Note (Signed)
Well controlled.  Continue Dexilant 60mg  daily

## 2011-08-11 NOTE — Assessment & Plan Note (Signed)
Hgb improving s/p endometrial ablation.  No GI bleeding noted.  Recent colonoscopy/EGD benign.  Recheck labs in 6 weeks Follow up with Dr Despina Hidden regarding your vaginal spotting

## 2011-08-11 NOTE — Assessment & Plan Note (Signed)
FU Dr Jacqualine Mau

## 2011-08-11 NOTE — Patient Instructions (Addendum)
Please follow up w/ Dr Jacqualine Mau for Hepatitis C Continue Dexilant 60mg  daily Recheck labs in 6 weeks Follow up with Dr Despina Hidden regarding your vaginal spotting

## 2011-08-14 ENCOUNTER — Other Ambulatory Visit: Payer: Self-pay

## 2011-08-14 DIAGNOSIS — D509 Iron deficiency anemia, unspecified: Secondary | ICD-10-CM

## 2011-08-15 NOTE — Progress Notes (Signed)
Per Lubertha Basque, pt has 100% [facility] Assistance til 04/26/2012.

## 2011-08-15 NOTE — Progress Notes (Unsigned)
Mailed orders for pt to do on 09/22/2011. Also, mailed the Indigent form.

## 2011-08-17 ENCOUNTER — Ambulatory Visit (HOSPITAL_COMMUNITY)
Admission: RE | Admit: 2011-08-17 | Discharge: 2011-08-17 | Disposition: A | Discharge status: Home / Self Care | Payer: Medicaid Other | Source: Ambulatory Visit | Attending: Pulmonary Disease | Admitting: Pulmonary Disease

## 2011-08-17 DIAGNOSIS — R0602 Shortness of breath: Secondary | ICD-10-CM | POA: Insufficient documentation

## 2011-08-17 NOTE — Progress Notes (Signed)
Faxed to PCP

## 2011-08-22 NOTE — Procedures (Signed)
NAMEDAZANI, NORBY                ACCOUNT NO.:  [id number]  MEDICAL RECORD NO.:  [id number]  LOCATION:  RESP                          FACILITY:  [facility]  PHYSICIAN:  Matej Sappenfield L. Juanetta Gosling, M.D.DATE OF BIRTH:  September 07, 1960  DATE OF PROCEDURE: DATE OF DISCHARGE:  08/17/2011                           PULMONARY FUNCTION TEST   Reason for pulmonary function testing is shortness of breath. 1. Spirometry shows no ventilatory defect and no definite airflow     obstruction. 2. Lung volumes are normal. 3. DLCO is minimally reduced, but does correct when volume is taken     into account. 4. Airway resistance is normal.     Yaakov Saindon L. Juanetta Gosling, M.D.     EL[facility]/MEDQ  D:  08/22/2011  T:  08/22/2011  Job:  [id number]

## 2011-08-24 LAB — PULMONARY FUNCTION TEST

## 2011-08-25 ENCOUNTER — Encounter: Payer: Self-pay | Admitting: Cardiology

## 2011-08-28 ENCOUNTER — Ambulatory Visit (INDEPENDENT_AMBULATORY_CARE_PROVIDER_SITE_OTHER): Payer: Self-pay | Admitting: Cardiology

## 2011-08-28 ENCOUNTER — Encounter: Payer: Self-pay | Admitting: Cardiology

## 2011-08-28 VITALS — BP 158/78 | HR 77 | Ht 62.0 in | Wt 170.0 lb

## 2011-08-28 DIAGNOSIS — B1920 Unspecified viral hepatitis C without hepatic coma: Secondary | ICD-10-CM

## 2011-08-28 DIAGNOSIS — R0602 Shortness of breath: Secondary | ICD-10-CM | POA: Insufficient documentation

## 2011-08-28 DIAGNOSIS — I10 Essential (primary) hypertension: Secondary | ICD-10-CM | POA: Insufficient documentation

## 2011-08-28 NOTE — Assessment & Plan Note (Signed)
Keep followup with gastroenterology.

## 2011-08-28 NOTE — Assessment & Plan Note (Signed)
Outlined above, noted over the last several months. Chronic illnesses were reviewed. Patient also reports significant weight gain since last year. Her ECG is normal today. Recent PFTs were essentially normal. Plan is to proceed with an echocardiogram to assess cardiac structure and function, also exclude any potential for pulmonary hypertension. An exercise Myoview will also be obtained to assess for ischemia. We will inform her of the results.

## 2011-08-28 NOTE — Patient Instructions (Addendum)
**Note De-Identified  Obfuscation** Your physician has requested that you have an echocardiogram. Echocardiography is a painless test that uses sound waves to create images of your heart. It provides your doctor with information about the size and shape of your heart and how well your heart's chambers and valves are working. This procedure takes approximately one hour. There are no restrictions for this procedure.  Your physician has requested that you have en exercise stress myoview. For further information please visit [email or web address] Please follow instruction sheet, as given.  Your physician recommends that you schedule a follow-up appointment in: We will contact you with test results

## 2011-08-28 NOTE — Assessment & Plan Note (Signed)
Recently started on Norvasc.
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
## 2011-08-29 ENCOUNTER — Other Ambulatory Visit: Payer: Self-pay

## 2011-08-29 DIAGNOSIS — D509 Iron deficiency anemia, unspecified: Secondary | ICD-10-CM

## 2011-08-29 DIAGNOSIS — R0602 Shortness of breath: Secondary | ICD-10-CM

## 2011-08-29 LAB — CBC WITH DIFFERENTIAL/PLATELET
Hemoglobin: 11.5 g/dL — ABNORMAL LOW (ref 12.0–15.0)
Lymphocytes Relative: 43 % (ref 12–46)
Lymphs Abs: 2.2 10*3/uL (ref 0.7–4.0)
Neutro Abs: 2.4 10*3/uL (ref 1.7–7.7)
Neutrophils Relative %: 47 % (ref 43–77)
Platelets: 285 10*3/uL (ref 150–400)
RBC: 4.84 MIL/uL (ref 3.87–5.11)
WBC: 5.1 10*3/uL (ref 4.0–10.5)

## 2011-08-29 LAB — FERRITIN: Ferritin: 10 ng/mL (ref 10–291)

## 2011-08-29 NOTE — Progress Notes (Signed)
Called and informed pt. She said she would not be back over this way until 09/06/2011, but she will try to get someone to pick them up.

## 2011-08-29 NOTE — Progress Notes (Signed)
Quick Note:  Pt was informed. She will try to send someone by to pick up the samples. #24 given ( all we had ). CBC order on file for 11/28/2011. ______

## 2011-08-29 NOTE — Progress Notes (Signed)
Doris, should say "Integra Plus daily for 4 weeks"

## 2011-08-29 NOTE — Progress Notes (Signed)
Quick Note:  Please let pt know ferritin (iron) has improved, Hgb just below normal. Trial Integra plus daily for 4 (iron + folic acid) Please give samples if we have. Recheck CBC in 3 months. Thanks ZO:[email or web address] L, MD  ______

## 2011-09-06 ENCOUNTER — Encounter (HOSPITAL_COMMUNITY)
Admission: RE | Admit: 2011-09-06 | Discharge: 2011-09-06 | Disposition: A | Discharge status: Home / Self Care | Payer: Medicaid Other | Source: Ambulatory Visit | Attending: Cardiology | Admitting: Cardiology

## 2011-09-06 ENCOUNTER — Ambulatory Visit (HOSPITAL_COMMUNITY)
Admission: RE | Admit: 2011-09-06 | Discharge: 2011-09-06 | Disposition: A | Discharge status: Home / Self Care | Payer: Medicaid Other | Source: Ambulatory Visit | Attending: Cardiology | Admitting: Cardiology

## 2011-09-06 ENCOUNTER — Encounter (HOSPITAL_COMMUNITY): Payer: Self-pay | Admitting: Cardiology

## 2011-09-06 ENCOUNTER — Encounter (HOSPITAL_COMMUNITY): Payer: Self-pay

## 2011-09-06 ENCOUNTER — Ambulatory Visit (INDEPENDENT_AMBULATORY_CARE_PROVIDER_SITE_OTHER): Payer: No Typology Code available for payment source | Admitting: *Deleted

## 2011-09-06 DIAGNOSIS — I10 Essential (primary) hypertension: Secondary | ICD-10-CM | POA: Insufficient documentation

## 2011-09-06 DIAGNOSIS — R0609 Other forms of dyspnea: Secondary | ICD-10-CM | POA: Insufficient documentation

## 2011-09-06 DIAGNOSIS — I517 Cardiomegaly: Secondary | ICD-10-CM

## 2011-09-06 DIAGNOSIS — R0989 Other specified symptoms and signs involving the circulatory and respiratory systems: Secondary | ICD-10-CM | POA: Insufficient documentation

## 2011-09-06 DIAGNOSIS — R0602 Shortness of breath: Secondary | ICD-10-CM

## 2011-09-06 MED ORDER — TECHNETIUM TC 99M TETROFOSMIN IV KIT
30.0000 | PACK | Freq: Once | INTRAVENOUS | Status: AC | PRN
  Administered 2011-09-06: 31 via INTRAVENOUS

## 2011-09-06 MED ORDER — TECHNETIUM TC 99M TETROFOSMIN IV KIT
10.0000 | PACK | Freq: Once | INTRAVENOUS | Status: AC | PRN
  Administered 2011-09-06: 10.5 via INTRAVENOUS

## 2011-09-06 NOTE — Progress Notes (Signed)
*  PRELIMINARY RESULTS* Echocardiogram 2D Echocardiogram has been performed.  [name] 09/06/2011, 9:53 AM

## 2011-09-06 NOTE — Progress Notes (Signed)
Stress Lab Nurses Notes - [name]  [name] 09/06/2011  Reason for doing test: Chest Pain  Type of test: Stress Myoview and Test Changed Laexscan  Nurse performing test: Marlena Clipper, RN  Nuclear Medicine Tech: Lou Cal  Echo Tech: Not Applicable  MD performing test: R. Rothbart  Family MD: Kari Baars  Test explained and consent signed: yes  IV started: 24g jelco, Saline lock flushed and Saline lock started in radiology  Symptoms: Sob and legs hurting could not walk Treatment/Intervention: None  Reason test stopped: fatigue, protocol completed and SOB  After recovery IV was: Discontinued via X-ray tech, No redness or edema and Saline Lock flushed  Patient to return to Nuc. Med at :12:05  Patient discharged: Home  Patient's Condition upon discharge was: stable  Comments: Patient could not walk changed to lexiscan.Peak BP was 132/82 and peak pulse was 120.  [name]

## 2011-09-07 ENCOUNTER — Telehealth: Payer: Self-pay | Admitting: Internal Medicine

## 2011-09-07 NOTE — Telephone Encounter (Signed)
Spoke with [name]- she has been feeling this way for 4 days. She is very weak, clammy and sweaty all the time. [name] had stress test done yesterday for Dr. Juanetta Gosling for SOB. [name] stated she wasn't in pain, no vomiting, no nausea, she was eating ok and taking her iron, her hgb on 08/28/11 was 11.5. Advised [name] that these symptoms may not be GI related and she should go to ED and be evaluated. [name] agreed and stated she would go to ED.

## 2011-09-07 NOTE — Telephone Encounter (Signed)
Patient is so weak she cant get out of the bed, she is clamy and sweaty and she is eating and drinking but has no energy please advise?

## 2011-09-26 ENCOUNTER — Telehealth: Payer: Self-pay | Admitting: *Deleted

## 2011-09-26 MED ORDER — DEXLANSOPRAZOLE 60 MG PO CPDR: 60.0000 mg | DELAYED_RELEASE_CAPSULE | Freq: Every day | ORAL | Status: DC

## 2011-09-26 NOTE — Telephone Encounter (Signed)
Per note 08/28/11 (attached to lab), she was given Integra plus. KJ wanted her on four weeks. May give more samples if available. Let me know if none available.  Dexilant sent to pharmacy.

## 2011-09-26 NOTE — Telephone Encounter (Signed)
Ms [name] called today for some refills and free samples on some medication.  She received free samples effusion plus and the dexilant she needs a refill on.  Please follow up. Thanks.

## 2011-09-27 NOTE — Telephone Encounter (Signed)
Samples at front desk. LMOM for pt to pick up.

## 2011-10-04 ENCOUNTER — Telehealth: Payer: Self-pay | Admitting: *Deleted

## 2011-10-04 NOTE — Telephone Encounter (Signed)
Ms [name] called today. She would like someone to discuss her liver biopsy she had a few months ago. Please call her back. Thanks.

## 2011-10-05 NOTE — Telephone Encounter (Signed)
Tried to call pt- number was busy  

## 2011-10-09 ENCOUNTER — Telehealth: Payer: Self-pay

## 2011-10-09 MED ORDER — PANTOPRAZOLE SODIUM 40 MG PO TBEC: 40.0000 mg | DELAYED_RELEASE_TABLET | Freq: Every day | ORAL | Status: DC

## 2011-10-09 MED ORDER — ONDANSETRON 4 MG PO TBDP: 4.0000 mg | ORAL_TABLET | Freq: Three times a day (TID) | ORAL | Status: AC | PRN

## 2011-10-09 MED ORDER — FUSION PLUS PO CAPS: 1.0000 | ORAL_CAPSULE | ORAL | Status: DC

## 2011-10-09 NOTE — Telephone Encounter (Signed)
Pt called back again, she is requesting something called in for nausea and vomiting. She said she has had nausea and vomiting x 2 days.

## 2011-10-09 NOTE — Telephone Encounter (Signed)
Protonix prescription sent. I sent in Zofran # 20. What is going on with N/V? Needs office visit if continues.

## 2011-10-09 NOTE — Telephone Encounter (Signed)
Pt came by office.

## 2011-10-09 NOTE — Telephone Encounter (Signed)
Received PA request for pts dexilant. Pt has [place] medicaid and has tried omeprazole only. Pt needs to try pantoprazole prior to getting dexilant. She said she will be using [facility] now and will need rx sent in today because her insurance runs out today.    She is also requesting rx for effusion plus sent to [facility].

## 2011-10-09 NOTE — Telephone Encounter (Signed)
Tried to call pt- LMOM 

## 2011-10-29 DIAGNOSIS — R079 Chest pain, unspecified: Secondary | ICD-10-CM

## 2011-11-21 ENCOUNTER — Other Ambulatory Visit: Payer: Self-pay | Admitting: Urgent Care

## 2011-11-21 LAB — CBC WITH DIFFERENTIAL/PLATELET
Basophils Relative: 0 % (ref 0–1)
Hemoglobin: 13.6 g/dL (ref 12.0–15.0)
Lymphocytes Relative: 35 % (ref 12–46)
Lymphs Abs: 2.2 10*3/uL (ref 0.7–4.0)
MCHC: 33.8 g/dL (ref 30.0–36.0)
Monocytes Relative: 4 % (ref 3–12)
Neutro Abs: 3.5 10*3/uL (ref 1.7–7.7)
Neutrophils Relative %: 57 % (ref 43–77)
RBC: 4.69 MIL/uL (ref 3.87–5.11)
WBC: 6.1 10*3/uL (ref 4.0–10.5)

## 2011-11-27 NOTE — Progress Notes (Signed)
Quick Note:  Please let pt know Hgb normal now Keep FU in December as planned WJ:[id number] L, MD  ______

## 2011-12-05 ENCOUNTER — Other Ambulatory Visit (HOSPITAL_COMMUNITY): Payer: Self-pay | Admitting: Pulmonary Disease

## 2011-12-05 DIAGNOSIS — Z139 Encounter for screening, unspecified: Secondary | ICD-10-CM

## 2011-12-07 LAB — COMPREHENSIVE METABOLIC PANEL
AST: 36 U/L
BUN: 11 mg/dL (ref 4–21)
Glucose: 70
Potassium: 4.1 mmol/L

## 2011-12-07 LAB — CBC
Hemoglobin: 12.9 g/dL (ref 12.0–16.0)
WBC: 5.4

## 2011-12-11 ENCOUNTER — Ambulatory Visit (HOSPITAL_COMMUNITY)
Admission: RE | Admit: 2011-12-11 | Discharge: 2011-12-11 | Disposition: A | Discharge status: Home / Self Care | Payer: Medicaid Other | Source: Ambulatory Visit | Attending: Pulmonary Disease | Admitting: Pulmonary Disease

## 2011-12-11 ENCOUNTER — Other Ambulatory Visit: Payer: Self-pay | Admitting: Gastroenterology

## 2011-12-11 DIAGNOSIS — Z1231 Encounter for screening mammogram for malignant neoplasm of breast: Secondary | ICD-10-CM | POA: Insufficient documentation

## 2011-12-11 DIAGNOSIS — Z139 Encounter for screening, unspecified: Secondary | ICD-10-CM

## 2011-12-12 ENCOUNTER — Encounter: Payer: Self-pay | Admitting: Internal Medicine

## 2011-12-13 ENCOUNTER — Other Ambulatory Visit: Payer: Self-pay

## 2011-12-13 ENCOUNTER — Encounter: Payer: Self-pay | Admitting: Gastroenterology

## 2011-12-13 ENCOUNTER — Ambulatory Visit (INDEPENDENT_AMBULATORY_CARE_PROVIDER_SITE_OTHER): Payer: Medicaid Other | Admitting: Gastroenterology

## 2011-12-13 VITALS — BP 126/86 | HR 98 | Temp 98.1°F | Ht 62.0 in | Wt 174.8 lb

## 2011-12-13 DIAGNOSIS — B1920 Unspecified viral hepatitis C without hepatic coma: Secondary | ICD-10-CM

## 2011-12-13 DIAGNOSIS — K219 Gastro-esophageal reflux disease without esophagitis: Secondary | ICD-10-CM

## 2011-12-13 DIAGNOSIS — K5900 Constipation, unspecified: Secondary | ICD-10-CM

## 2011-12-13 MED ORDER — LUBIPROSTONE 24 MCG PO CAPS: 24.0000 ug | ORAL_CAPSULE | Freq: Two times a day (BID) | ORAL | Status: DC

## 2011-12-13 MED ORDER — PANTOPRAZOLE SODIUM 40 MG PO TBEC: 40.0000 mg | DELAYED_RELEASE_TABLET | Freq: Every day | ORAL | Status: DC

## 2011-12-13 MED ORDER — OMEPRAZOLE 20 MG PO CPDR: 20.0000 mg | DELAYED_RELEASE_CAPSULE | Freq: Every day | ORAL | Status: DC

## 2011-12-13 NOTE — Assessment & Plan Note (Signed)
Chronic constipation. Fleet enema today. Start Amitiza 24 mcg bid with food.

## 2011-12-13 NOTE — Assessment & Plan Note (Signed)
Patient prefers Prilosec. States it worked better. Stop protonix.

## 2011-12-13 NOTE — Patient Instructions (Addendum)
Please stop pantoprazole. Start Prilosec generic one pill daily before breakfast. RX sent to [facility]. Start Amitiza twice a day with food for your constipation. RX sent to [facility]. Use another Fleet enema today. I will look into getting you back in to a Hepatitis Clinic for treatment.   Please call Dr. Adah Perl today or tomorrow morning about your upper respiratory infection!!!

## 2011-12-13 NOTE — Progress Notes (Signed)
Primary Care Physician: Fredirick Maudlin, MD  Primary Gastroenterologist:  Roetta Sessions, MD   Chief Complaint  Patient presents with  . Constipation    HPI: [name] is a 51 y.o. female here for f/u of hepatitis at request of Dr. Juanetta Gosling. Patient was last seen in office in 07/2011 for f/u IDA. She has been followed by Dr. Jacqualine Mau for 1a chronic HCV. Last OV with Dr. Jacqualine Mau [phone]) was in 03/2011. Since then, she did have liver biopsy which showed active hepatitis c/w HCV and grade 2 fibrosis. Patient states she never received call to come back and initiate treatment.    She states she has noticed heartburn is not controlled on Pantoprazole. She wants to go back on Prilosec instead. Continues to have problems with constipation. May have BM 1-2 times per week, but no BM in two weeks. Used fleet enema and passed one small "ball". Occasional brbpr with straining. C/O weight gain, weight up 6 pounds since 04/2011. Got married in 05/2011. Started with URI yesterday, low-grade fever and cough. No SOB. Taking OTC cough med.   Current Outpatient Prescriptions  Medication Sig Dispense Refill  . ALPRAZolam (XANAX) 1 MG tablet Take 1 mg by mouth 4 (four) times daily as needed. For anxiety       . amLODipine (NORVASC) 5 MG tablet Take 5 mg by mouth daily.      [name] amphetamine-dextroamphetamine (ADDERALL, 30MG ,) 30 MG tablet Take 30 mg by mouth every morning.       . cyclobenzaprine (FLEXERIL) 10 MG tablet Take 10 mg by mouth 2 (two) times daily as needed.       [name] HYDROcodone-acetaminophen (NORCO) 10-325 MG per tablet Take 1 tablet by mouth every 6 (six) hours as needed.      . Iron-FA-B Cmp-C-Biot-Probiotic (FUSION PLUS) CAPS Take 1 capsule by mouth 1 day or 1 dose.  30 capsule  3  . pantoprazole (PROTONIX) 40 MG tablet TAKE 1 TABLET BY MOUTH DAILY.  30 tablet  5  . ziprasidone (GEODON) 20 MG capsule Take 80 mg by mouth at bedtime.       [name] DISCONTD: promethazine (PHENERGAN) 25 MG tablet Take 1  tablet (25 mg total) by mouth every 6 (six) hours as needed.  30 tablet  0    Allergies as of 12/13/2011 - Review Complete 12/13/2011  Allergen Reaction Noted  . Penicillins Anaphylaxis 10/06/2010    ROS:  General: Negative for anorexia, weight loss,  chills, fatigue, weakness. See hpi. ENT: Negative for hoarseness, difficulty swallowing , nasal congestion. CV: Negative for chest pain, angina, palpitations, dyspnea on exertion, peripheral edema.  Respiratory: Negative for dyspnea at rest, dyspnea on exertion. C/O cough, wheezing.  GI: See history of present illness. GU:  Negative for dysuria, hematuria, urinary incontinence, urinary frequency, nocturnal urination.  Endo: Negative for unusual weight change.    Physical Examination:   BP 126/86  Pulse 98  Temp 98.1 F (36.7 C) (Temporal)  Ht 5\' 2"  (1.575 m)  Wt 174 lb 12.8 oz (79.289 kg)  BMI 31.97 kg/m2  General: Well-nourished, well-developed in no acute distress.  Eyes: No icterus. Mouth: Oropharyngeal mucosa moist and pink , no lesions erythema or exudate. Lungs: Wheezing noted right lung.   Heart: Regular rate and rhythm, no murmurs rubs or gallops.  Abdomen: Bowel sounds are normal, nontender, nondistended, no hepatosplenomegaly or masses, no abdominal bruits or hernia , no rebound or guarding.   Extremities: No lower extremity edema. No clubbing or deformities. Neuro:  Alert and oriented x 4   Skin: Warm and dry, no jaundice.   Psych: Alert and cooperative, normal mood and affect.  Labs:  Labs from December 06 2011. Glucose 70, BUN 11, creatinine 0.82, total bilirubin 0.3, alkaline phosphatase 82, AST 36, ALT 31, albumin 4.3, TSH 2.57, white blood cell count 5400, hemoglobin 12.9, platelets 195,000.

## 2011-12-13 NOTE — Assessment & Plan Note (Signed)
Last encounter with Dr. Jacqualine Mau was in 03/2011. We will make arrangements for f/u but apparently the Hepatitis clinic is in transition with new management at this time. Discussed with patient.

## 2011-12-14 NOTE — Progress Notes (Signed)
Faxed to PCP

## 2012-01-04 ENCOUNTER — Encounter (HOSPITAL_COMMUNITY): Payer: Self-pay

## 2012-01-11 ENCOUNTER — Encounter (HOSPITAL_COMMUNITY)
Admission: RE | Admit: 2012-01-11 | Discharge: 2012-01-11 | Disposition: A | Discharge status: Home / Self Care | Payer: Medicaid Other | Source: Ambulatory Visit | Attending: Urology | Admitting: Urology

## 2012-01-11 ENCOUNTER — Encounter (HOSPITAL_COMMUNITY): Payer: Self-pay

## 2012-01-11 HISTORY — DX: Personal history of other diseases of the digestive system: Z87.19

## 2012-01-11 HISTORY — DX: Unspecified osteoarthritis, unspecified site: M19.90

## 2012-01-11 LAB — HEMOGLOBIN AND HEMATOCRIT, BLOOD: HCT: 42.8 % (ref 36.0–46.0)

## 2012-01-11 LAB — BASIC METABOLIC PANEL
BUN: 17 mg/dL (ref 6–23)
Calcium: 9.8 mg/dL (ref 8.4–10.5)
GFR calc non Af Amer: 69 mL/min — ABNORMAL LOW (ref >90)
Glucose, Bld: 126 mg/dL — ABNORMAL HIGH (ref 70–99)
Sodium: 139 mEq/L (ref 135–145)

## 2012-01-11 NOTE — Patient Instructions (Addendum)
Your procedure is scheduled on:  01/16/12  Report to [facility] at     6:15  AM.  Call this number if you have problems the morning of surgery: [phone]   Remember:   Do not drink or eat food:After Midnight.    Take these medicines the morning of surgery with A SIP OF WATER: xanax, norvasc, flexeril, norco, adderall,  Zofran, protonix, prilosec, ziprasidone   Do not wear jewelry, make-up or nail polish.  Do not wear lotions, powders, or perfumes.   Do not shave 48 hours prior to surgery. Men may shave face and neck.  Do not bring valuables to the hospital.  Contacts, dentures or bridgework may not be worn into surgery.  Leave suitcase in the car. After surgery it may be brought to your room.  For patients admitted to the hospital, checkout time is 11:00 AM the day of discharge.   Patients discharged the day of surgery will not be allowed to drive home.   Please read over the following fact sheets that you were given: Pain Booklet, MRSA Information, Surgical Site Infection Prevention, Anesthesia Post-op Instructions and Care and Recovery After Surgery    Tension-Free Vaginal Tape System Tension-free vaginal tape (TVT or TFT) procedure is a surgery procedure to correct involuntary leaking from the bladder (urinary stress incontinence) in women. A mesh tape is used as a sling underneath the area where the bladder and urethra connect. This restores the muscle and connective tissue (fascia) that have been stretched and damaged from having babies or from chronic stress and straining. The procedure takes less than 1 hour. You will go home the same day as the procedure (outpatient).  LET YOUR CAREGIVER KNOW ABOUT:   Any allergies, especially to medicines.  All the medicines you are taking, including prescription drugs, over-the-counter drugs, herbal medicines, eyedrops, and creams.  Previous problems with anesthesia or numbing medicine.  History of blood clots or other bleeding  problems.  Previous surgery, especially bladder or vaginal surgery.  Other health problems or medical diseases.  Possibility of pregnancy, if this applies. RISKS AND COMPLICATIONS   Bleeding.  Infection.  Injury to surrounding organs and blood vessels.  Problems with urination after the surgery.  Failure of the procedure to work as expected.  Allergy to the mesh tape.  Problems or allergies to the anesthetic. BEFORE THE PROCEDURE   Do not eat or drink anything for 12 hours before the surgery.  Do not take aspirin or blood thinners (anticoagulants) for 1 week before the surgery.  Let your caregiver know if you develop a cold or an infection before the surgery. PROCEDURE  An intravenous line (IV) will be placed in your arm. You will be given medicine to help you relax (sedative). You will be given medicine to numb the area above the pubic bone and along the top of the vagina (local anesthetic). Two very small cuts (incisions) will be made just above the pubic bone. The lining (mucosa) at the top of the vagina will be opened and separated on each side of the urethra and bladder. A thin, flexible tube (catheter) is then placed in the bladder for filling and drainage during the procedure. Then the mesh tape is placed under the urethra and the ends are passed up behind the pubic bone, just under the skin. After the mesh tape is properly placed, the catheter is removed. The patient is asked to cough so that adjustments can be made to the tape to make sure  there is no leaking of urine before stitching (suturing) the mesh tape in place. The ends of the mesh tape are brought up, just under the skin, and sutured in place. The skin incisions are sutured closed. The vaginal lining is also sutured closed, and you are taken to the recovery room. This procedure should not be done if the woman is pregnant or plans to get pregnant, is on anticoagulants, or has a urinary tract infection. AFTER THE  PROCEDURE  You will go to a recovery area until your vital signs (blood pressure, pulse, breathing, and temperature) are stable. You may need to stay in the hospital for 1 or 2 days, but most patients are sent home the day of the surgery. You may be sent home with pain pills or an antibiotic if necessary. HOME CARE INSTRUCTIONS   Follow your caregiver's advice about diet, rest, driving, exercise, medicines, and follow-up appointments.  You may take over-the-counter pain medicine with your caregiver's recommendation.  Do not take aspirin. It can cause bleeding.  Do not lift more than 5 pounds (2.3 kg) until your caregiver says it is okay.  Do not have sexual intercourse until your caregiver says it is okay.  Do not use tampons.  You may take a laxative, if needed, with your caregiver's recommendation.  You may take sitz baths 2 to 3 times a day with your caregiver's advice. SEEK MEDICAL CARE IF:   You have increasing pain in the wound area.  You have increased swelling and redness in the wound area.  You develop a rash.  You develop nausea, diarrhea, constipation, or vomiting.  You think the sutures in the wound are breaking up.  You lose urine when you cough or sneeze. SEEK IMMEDIATE MEDICAL CARE IF:   You have a fever.  You begin to bleed from the wound area, above the pubic bone or vagina.  You see pus coming from the incisions.  You develop an abnormal vaginal discharge.  You cannot urinate.  You develop bloody or painful urination.  You pass out.  You develop chest or leg pain.  You develop abdominal pain. Document Released: 03/07/2009 Document Revised: 05/08/2011 Document Reviewed: 03/07/2009 [facility] Patient Information 2013 [place], [place]. PATIENT INSTRUCTIONS POST-ANESTHESIA  IMMEDIATELY FOLLOWING SURGERY:  Do not drive or operate machinery for the first twenty four hours after surgery.  Do not make any important decisions for twenty four hours after  surgery or while taking narcotic pain medications or sedatives.  If you develop intractable nausea and vomiting or a severe headache please notify your doctor immediately.  FOLLOW-UP:  Please make an appointment with your surgeon as instructed. You do not need to follow up with anesthesia unless specifically instructed to do so.  WOUND CARE INSTRUCTIONS (if applicable):  Keep a dry clean dressing on the anesthesia/puncture wound site if there is drainage.  Once the wound has quit draining you may leave it open to air.  Generally you should leave the bandage intact for twenty four hours unless there is drainage.  If the epidural site drains for more than 36-48 hours please call the anesthesia department.  QUESTIONS?:  Please feel free to call your physician or the hospital operator if you have any questions, and they will be happy to assist you.
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
## 2012-01-16 ENCOUNTER — Ambulatory Visit (HOSPITAL_COMMUNITY): Payer: Medicaid Other | Admitting: Anesthesiology

## 2012-01-16 ENCOUNTER — Encounter (HOSPITAL_COMMUNITY)
Admission: RE | Disposition: A | Discharge status: Home / Self Care | Payer: Self-pay | Source: Ambulatory Visit | Attending: Urology

## 2012-01-16 ENCOUNTER — Encounter (HOSPITAL_COMMUNITY): Payer: Self-pay | Admitting: Anesthesiology

## 2012-01-16 ENCOUNTER — Ambulatory Visit (HOSPITAL_COMMUNITY)
Admission: RE | Admit: 2012-01-16 | Discharge: 2012-01-16 | Disposition: A | Discharge status: Home / Self Care | Payer: Medicaid Other | Source: Ambulatory Visit | Attending: Urology | Admitting: Urology

## 2012-01-16 ENCOUNTER — Encounter (HOSPITAL_COMMUNITY): Payer: Self-pay | Admitting: *Deleted

## 2012-01-16 DIAGNOSIS — N393 Stress incontinence (female) (male): Secondary | ICD-10-CM | POA: Insufficient documentation

## 2012-01-16 DIAGNOSIS — I10 Essential (primary) hypertension: Secondary | ICD-10-CM | POA: Insufficient documentation

## 2012-01-16 DIAGNOSIS — F17200 Nicotine dependence, unspecified, uncomplicated: Secondary | ICD-10-CM | POA: Insufficient documentation

## 2012-01-16 DIAGNOSIS — Z01812 Encounter for preprocedural laboratory examination: Secondary | ICD-10-CM | POA: Insufficient documentation

## 2012-01-16 DIAGNOSIS — K219 Gastro-esophageal reflux disease without esophagitis: Secondary | ICD-10-CM | POA: Insufficient documentation

## 2012-01-16 HISTORY — PX: BLADDER SUSPENSION: SHX72

## 2012-01-16 SURGERY — TRANSVAGINAL TAPE (TVT) PROCEDURE
Anesthesia: General | Site: Perineum | Wound class: Clean Contaminated

## 2012-01-16 MED ORDER — STERILE WATER FOR IRRIGATION IR SOLN
Status: DC | PRN
  Administered 2012-01-16: 1000 mL

## 2012-01-16 MED ORDER — LACTATED RINGERS IV SOLN
INTRAVENOUS | Status: DC
  Administered 2012-01-16: 08:00:00 via INTRAVENOUS

## 2012-01-16 MED ORDER — FENTANYL CITRATE 0.05 MG/ML IJ SOLN
INTRAMUSCULAR | Status: AC
  Filled 2012-01-16: qty 2

## 2012-01-16 MED ORDER — ONDANSETRON HCL 4 MG/2ML IJ SOLN: 4.0000 mg | Freq: Once | INTRAMUSCULAR | Status: DC | PRN

## 2012-01-16 MED ORDER — PROPOFOL 10 MG/ML IV EMUL
INTRAVENOUS | Status: DC | PRN
  Administered 2012-01-16: 150 mg via INTRAVENOUS

## 2012-01-16 MED ORDER — MIDAZOLAM HCL 2 MG/2ML IJ SOLN
INTRAMUSCULAR | Status: AC
  Filled 2012-01-16: qty 2

## 2012-01-16 MED ORDER — PROPOFOL 10 MG/ML IV EMUL
INTRAVENOUS | Status: AC
  Filled 2012-01-16: qty 20

## 2012-01-16 MED ORDER — CIPROFLOXACIN IN D5W 200 MG/100ML IV SOLN: 200.0000 mg | Freq: Two times a day (BID) | INTRAVENOUS | Status: DC

## 2012-01-16 MED ORDER — LIDOCAINE HCL (PF) 1 % IJ SOLN
INTRAMUSCULAR | Status: AC
  Filled 2012-01-16: qty 5

## 2012-01-16 MED ORDER — ARTIFICIAL TEARS OP OINT
TOPICAL_OINTMENT | OPHTHALMIC | Status: AC
  Filled 2012-01-16: qty 3.5

## 2012-01-16 MED ORDER — GLYCOPYRROLATE 0.2 MG/ML IJ SOLN
INTRAMUSCULAR | Status: DC | PRN
  Administered 2012-01-16: 0.6 mg via INTRAVENOUS

## 2012-01-16 MED ORDER — LIDOCAINE HCL (CARDIAC) 10 MG/ML IV SOLN
INTRAVENOUS | Status: DC | PRN
  Administered 2012-01-16: 50 mg via INTRAVENOUS

## 2012-01-16 MED ORDER — CIPROFLOXACIN IN D5W 400 MG/200ML IV SOLN
INTRAVENOUS | Status: DC | PRN
  Administered 2012-01-16: 200 mg via INTRAVENOUS

## 2012-01-16 MED ORDER — LACTATED RINGERS IV SOLN
INTRAVENOUS | Status: DC | PRN
  Administered 2012-01-16 (×2): via INTRAVENOUS

## 2012-01-16 MED ORDER — SODIUM CHLORIDE 0.9 % IJ SOLN
INTRAMUSCULAR | Status: DC | PRN
  Administered 2012-01-16: 10 mL via INTRAVENOUS

## 2012-01-16 MED ORDER — NEOSTIGMINE METHYLSULFATE 1 MG/ML IJ SOLN
INTRAMUSCULAR | Status: DC | PRN
  Administered 2012-01-16: 4 mg via INTRAVENOUS

## 2012-01-16 MED ORDER — MUPIROCIN 2 % EX OINT
TOPICAL_OINTMENT | CUTANEOUS | Status: AC
  Filled 2012-01-16: qty 22

## 2012-01-16 MED ORDER — ROCURONIUM BROMIDE 100 MG/10ML IV SOLN
INTRAVENOUS | Status: DC | PRN
  Administered 2012-01-16: 30 mg via INTRAVENOUS

## 2012-01-16 MED ORDER — MIDAZOLAM HCL 2 MG/2ML IJ SOLN
1.0000 mg | INTRAMUSCULAR | Status: AC | PRN
  Administered 2012-01-16 (×3): 2 mg via INTRAVENOUS

## 2012-01-16 MED ORDER — FENTANYL CITRATE 0.05 MG/ML IJ SOLN
INTRAMUSCULAR | Status: DC | PRN
  Administered 2012-01-16: 50 ug via INTRAVENOUS
  Administered 2012-01-16: 100 ug via INTRAVENOUS
  Administered 2012-01-16: 50 ug via INTRAVENOUS

## 2012-01-16 MED ORDER — CIPROFLOXACIN IN D5W 200 MG/100ML IV SOLN
INTRAVENOUS | Status: AC
  Filled 2012-01-16: qty 100

## 2012-01-16 MED ORDER — 0.9 % SODIUM CHLORIDE (POUR BTL) OPTIME
TOPICAL | Status: DC | PRN
  Administered 2012-01-16: 1000 mL

## 2012-01-16 MED ORDER — SUCCINYLCHOLINE CHLORIDE 20 MG/ML IJ SOLN
INTRAMUSCULAR | Status: AC
  Filled 2012-01-16: qty 1

## 2012-01-16 MED ORDER — FENTANYL CITRATE 0.05 MG/ML IJ SOLN
25.0000 ug | INTRAMUSCULAR | Status: DC | PRN
  Administered 2012-01-16 (×3): 50 ug via INTRAVENOUS

## 2012-01-16 MED ORDER — SODIUM CHLORIDE 0.9 % IR SOLN
Status: DC | PRN
  Administered 2012-01-16: 3000 mL

## 2012-01-16 MED ORDER — CIPROFLOXACIN HCL 250 MG PO TABS: 250.0000 mg | ORAL_TABLET | Freq: Two times a day (BID) | ORAL | Status: DC

## 2012-01-16 MED ORDER — ROCURONIUM BROMIDE 50 MG/5ML IV SOLN
INTRAVENOUS | Status: AC
  Filled 2012-01-16: qty 1

## 2012-01-16 SURGICAL SUPPLY — 43 items
ADH SKN CLS APL DERMABOND .7 (GAUZE/BANDAGES/DRESSINGS) ×1
BAG DRAIN URO TABLE W/ADPT NS (DRAPE) ×2 IMPLANT
BAG DRN 8 ADPR NS SKTRN CSTL (DRAPE) ×1
BAG URINE DRAINAGE (UROLOGICAL SUPPLIES) ×2 IMPLANT
CATH FOLEY 2WAY SLVR  5CC 18FR (CATHETERS) ×1
CATH FOLEY 2WAY SLVR  5CC 20FR (CATHETERS) ×1
CATH FOLEY 2WAY SLVR 5CC 18FR (CATHETERS) ×1 IMPLANT
CATH FOLEY 2WAY SLVR 5CC 20FR (CATHETERS) ×1 IMPLANT
CLOTH BEACON ORANGE TIMEOUT ST (SAFETY) ×2 IMPLANT
COVER LIGHT HANDLE STERIS (MISCELLANEOUS) ×4 IMPLANT
DERMABOND ADVANCED (GAUZE/BANDAGES/DRESSINGS) ×1
DERMABOND ADVANCED .7 DNX12 (GAUZE/BANDAGES/DRESSINGS) ×1 IMPLANT
ELECT REM PT RETURN 9FT ADLT (ELECTROSURGICAL) ×2
ELECTRODE REM PT RTRN 9FT ADLT (ELECTROSURGICAL) ×1 IMPLANT
GLOVE BIO SURGEON STRL SZ7 (GLOVE) ×2 IMPLANT
GLOVE BIOGEL PI IND STRL 7.0 (GLOVE) ×2 IMPLANT
GLOVE BIOGEL PI INDICATOR 7.0 (GLOVE) ×2
GLOVE ECLIPSE 6.5 STRL STRAW (GLOVE) ×4 IMPLANT
GLOVE EXAM NITRILE MD LF STRL (GLOVE) ×2 IMPLANT
GOWN STRL REIN XL XLG (GOWN DISPOSABLE) ×6 IMPLANT
INST SET MINOR GENERAL (KITS) ×2 IMPLANT
IV NS IRRIG 3000ML ARTHROMATIC (IV SOLUTION) ×2 IMPLANT
KIT ROOM TURNOVER AP CYSTO (KITS) ×2 IMPLANT
LUBRICANT JELLY 4.5OZ STERILE (MISCELLANEOUS) ×2 IMPLANT
MANIFOLD NEPTUNE II (INSTRUMENTS) ×2 IMPLANT
NEEDLE HYPO 18GX1.5 BLUNT FILL (NEEDLE) ×2 IMPLANT
NEEDLE HYPO 22GX1.5 SAFETY (NEEDLE) ×2 IMPLANT
NS IRRIG 1000ML POUR BTL (IV SOLUTION) ×2 IMPLANT
PACK PERI GYN (CUSTOM PROCEDURE TRAY) ×2 IMPLANT
PAD ARMBOARD 7.5X6 YLW CONV (MISCELLANEOUS) ×2 IMPLANT
SET BASIN LINEN APH (SET/KITS/TRAYS/PACK) ×2 IMPLANT
SET IRRIG Y TYPE TUR BLADDER L (SET/KITS/TRAYS/PACK) ×2 IMPLANT
SLING RETROPUBLIC (Sling) ×2 IMPLANT
SPONGE INTESTINAL PEANUT (DISPOSABLE) ×2 IMPLANT
SUT PROLENE 2 0 SH 30 (SUTURE) ×2 IMPLANT
SUT SILK 0 FSL (SUTURE) IMPLANT
SUT VIC AB 3-0 SH 27 (SUTURE) ×2
SUT VIC AB 3-0 SH 27X BRD (SUTURE) ×1 IMPLANT
SYR BULB IRRIGATION 50ML (SYRINGE) ×2 IMPLANT
SYR CONTROL 10ML LL (SYRINGE) ×2 IMPLANT
SYRINGE 10CC LL (SYRINGE) ×2 IMPLANT
WATER STERILE IRR 1000ML POUR (IV SOLUTION) ×2 IMPLANT
YANKAUER SUCT 12FT TUBE ARGYLE (SUCTIONS) ×2 IMPLANT

## 2012-01-16 NOTE — Brief Op Note (Signed)
01/16/2012  10:47 AM  PATIENT:  [name]  51 y.o. female  PRE-OPERATIVE DIAGNOSIS:  stress urinary incontinence  POST-OPERATIVE DIAGNOSIS:  stress urinary incontinence  PROCEDURE:  Procedure(s) (LRB) with comments: TRANSVAGINAL TAPE (TVT) PROCEDURE (N/A)  SURGEON:  Surgeon(s) and Role:    * Ky Barban, MD - Primary  PHYSICIAN ASSISTANT:   ASSISTANTS: none   ANESTHESIA:   general  EBL:  Total I/O In: 1000 [I.V.:1000] Out: 100 [Blood:100]  BLOOD ADMINISTERED:none  DRAINS: none   LOCAL MEDICATIONS USED:  NONE  SPECIMEN:  No Specimen  DISPOSITION OF SPECIMEN:  N/A  COUNTS:  YES  TOURNIQUET:  * No tourniquets in log *  DICTATION: .Other Dictation: Dictation Number [id number]  PLAN OF CARE: Discharge to home after PACU  PATIENT DISPOSITION:  PACU - hemodynamically stable.   Delay start of Pharmacological VTE agent (>24hrs) due to surgical blood loss or risk of bleeding:

## 2012-01-16 NOTE — Progress Notes (Signed)
I went in to check on pt and pt said that Dr. Jerre Simon told her that it was "normal for her after surgery to not pee for 3-4 days". I went to find Dr. Jerre Simon and he was already prepping for surgery. I talked with charge nurse to get clarification on this because normally pts have to urinate atleast once every 8 hours. I talked with pt about the fact that normally she would have to urinate before leaving after her procedure (TVT). I told pt that if she is sent home after surgery as planned, that she should be able to urinate every 8 hours, and if not, she should call Dr. Jerre Simon or come to the ER.  I also talked with pt about discussing this with Dr. Jerre Simon post op.

## 2012-01-16 NOTE — Progress Notes (Signed)
No change in H&P on reexamination. 

## 2012-01-16 NOTE — Anesthesia Procedure Notes (Signed)
Procedure Name: Intubation Date/Time: 01/16/2012 9:24 AM Performed by: Carolyne Littles, Latiya Navia L Pre-anesthesia Checklist: Patient identified, Patient being monitored, Timeout performed, Emergency Drugs available and Suction available Patient Re-evaluated:Patient Re-evaluated prior to inductionOxygen Delivery Method: Circle System Utilized Preoxygenation: Pre-oxygenation with 100% oxygen Intubation Type: IV induction, Rapid sequence and Cricoid Pressure applied Laryngoscope Size: 3 and Miller Grade View: Grade I Tube type: Oral Tube size: 7.0 mm Number of attempts: 1 Airway Equipment and Method: stylet Placement Confirmation: ETT inserted through vocal cords under direct vision,  positive ETCO2 and breath sounds checked- equal and bilateral Secured at: 21 cm Tube secured with: Tape Dental Injury: Teeth and Oropharynx as per pre-operative assessment

## 2012-01-16 NOTE — Transfer of Care (Signed)
  Anesthesia Post-op Note  Patient: [name]  Procedure(s) Performed: Procedure(s) (LRB) with comments: TRANSVAGINAL TAPE (TVT) PROCEDURE (N/A)  Patient Location: PACU  Anesthesia Type: General  Level of Consciousness: awake, alert , oriented and patient cooperative  Airway and Oxygen Therapy: Patient Spontanous Breathing and Patient connected to face mask oxygen  Post-op Pain: mild  Post-op Assessment: Post-op Vital signs reviewed, Patient's Cardiovascular Status Stable, Respiratory Function Stable, Patent Airway and No signs of Nausea or vomiting  Post-op Vital Signs: Reviewed and stable  Complications: No apparent anesthesia complications

## 2012-01-16 NOTE — Anesthesia Preprocedure Evaluation (Signed)
Anesthesia Evaluation  Patient identified by MRN, date of birth, ID band Patient awake    Reviewed: Allergy & Precautions, H&P , NPO status , Patient's Chart, lab work & pertinent test results  Airway Mallampati: II      Dental  (+) Teeth Intact   Pulmonary shortness of breath and with exertion, Current Smoker,  breath sounds clear to auscultation        Cardiovascular hypertension, - anginaRhythm:Regular     Neuro/Psych  Headaches, PSYCHIATRIC DISORDERS Anxiety    GI/Hepatic hiatal hernia, GERD-  Medicated,(+) Hepatitis -, C  Endo/Other    Renal/GU      Musculoskeletal   Abdominal   Peds  Hematology   Anesthesia Other Findings   Reproductive/Obstetrics                           Anesthesia Physical Anesthesia Plan  ASA: II  Anesthesia Plan: General   Post-op Pain Management:    Induction: Intravenous, Rapid sequence and Cricoid pressure planned  Airway Management Planned: Oral ETT  Additional Equipment:   Intra-op Plan:   Post-operative Plan: Extubation in OR  Informed Consent: I have reviewed the patients History and Physical, chart, labs and discussed the procedure including the risks, benefits and alternatives for the proposed anesthesia with the patient or authorized representative who has indicated his/her understanding and acceptance.     Plan Discussed with:   Anesthesia Plan Comments:         Anesthesia Quick Evaluation

## 2012-01-16 NOTE — Anesthesia Postprocedure Evaluation (Signed)
  Anesthesia Post-op Note  Patient: [name]  Procedure(s) Performed: Procedure(s) (LRB) with comments: TRANSVAGINAL TAPE (TVT) PROCEDURE (N/A)  Patient Location: PACU  Anesthesia Type: General  Level of Consciousness: awake, alert , oriented and patient cooperative  Airway and Oxygen Therapy: Patient Spontanous Breathing and Patient connected to face mask oxygen  Post-op Pain: mild  Post-op Assessment: Post-op Vital signs reviewed, Patient's Cardiovascular Status Stable, Respiratory Function Stable, Patent Airway and No signs of Nausea or vomiting  Post-op Vital Signs: Reviewed and stable  Complications: No apparent anesthesia complications  

## 2012-01-16 NOTE — Preoperative (Signed)
Beta Blockers   Reason not to administer Beta Blockers:Not Applicable 

## 2012-01-16 NOTE — H&P (Signed)
NAMEMARIJANE, [name]                 ACCOUNT NO.:  [id number]  MEDICAL RECORD NO.:  [id number]  LOCATION:                                 FACILITY:  PHYSICIAN:  Ky Barban, M.D.DATE OF BIRTH:  1960-05-20  DATE OF ADMISSION:  01/16/2012 DATE OF DISCHARGE:  LH                             HISTORY & PHYSICAL   CHIEF COMPLAINT:  Stress urinary incontinence.  A 51 year old female referred to me from [facility].  She has marked stress incontinence, uses 2 to 3 pads every day, leaks on coughing, lifting, or just moving around in the chair.  She has urgency, urge incontinence also.  No fever, chills, or gross hematuria.  She underwent complete workup in the office.  Cystoscopy systematic was done.  Residual urine is about 25 mL. Cystometrics is essentially normal and cystoscopy showed marked stress incontinence which goes away with the elevation of the bladder neck.  PAST MEDICAL HISTORY:  She had tubal ligation done 1985, recently had a uterine ablation.  No history of diabetes or hypertension.  REVIEW OF SYSTEMS:  Unremarkable.  PERSONAL HISTORY:  She smokes 1 pack twice a week.  Does not drink.  ALLERGIES:  She is allergic to PENICILLIN and have acid reflux.  PHYSICAL EXAMINATION:  GENERAL:  Moderately built female not in acute distress, fully conscious, alert, and oriented. VITAL SIGNS:  Blood pressure 147/85, temperature 98.6. CENTRAL NERVOUS SYSTEM:  No gross neurological deficit. HEAD, NECK, EYES, ENT:  Negative. CHEST:  Symmetrical. NECK:  Supple.  No thyromegaly.  No lymphadenopathy in the neck. CHEST:  Symmetrical.  No breath sounds. HEART:  Regular sinus rhythm.  No murmur. ABDOMEN:  Soft and flat.  Liver, spleen, and kidneys are not palpable. BACK:  No CVA tenderness. PELVIC EXAM:  No adnexal mass or tenderness.  No cystocele or rectocele.  IMPRESSION:  Stress urinary incontinence.  PLAN:  DVT under anesthesia as outpatient.  I have discussed in detail and  procedure and its complications especially.  I told her about urinary retention.  If it goes into retention and after several weeks trial, if she continues to be under tension, then I will have to go back and remove the tape, then she will have the problem back again.  She understands and want me to go ahead and try.  She says that she has tried [employer] bariatric with some benefit to urge incontinence, but she could not take it because it raises her blood pressure.  So, she will come in the morning as outpatient and will get preoperative antibiotics and then DVT under anesthesia as outpatient.     Ky Barban, M.D.     MIJ/MEDQ  D:  01/15/2012  T:  01/16/2012  Job:  [id number]

## 2012-01-17 NOTE — Op Note (Signed)
[name], [name]                 ACCOUNT NO.:  [id number]  MEDICAL RECORD NO.:  [id number]  LOCATION:  [facility]                          FACILITY:  [facility]  PHYSICIAN:  Ky Barban, M.D.DATE OF BIRTH:  06-24-1960  DATE OF PROCEDURE:  01/16/2012 DATE OF DISCHARGE:  01/16/2012                              OPERATIVE REPORT   PREOPERATIVE DIAGNOSIS:  Stress urinary incontinence.  POSTOPERATIVE DIAGNOSIS:  Stress urinary incontinence.  PROCEDURE:  Tension-free vaginal tape.  ANESTHESIA:  General.  DESCRIPTION OF PROCEDURE:  The patient under general anesthesia, in lithotomy position, after usual prep and drape, #20 Foley catheter was inserted into the bladder.  The suprapubic site at the pubic tubercle on both sides of the midline was marked and the periurethral tissue was infiltrated with 10 mL of normal saline at the level of the mid urethra. Now, an incision about 1 cm long, about 1.5 cm proximal to the urethral meatus was made right over the urethra and the vaginal wall was dissected off the periurethral tissue, little bit on both sides of the midline.  Now, Foley catheter was taken out.  A catheter with the trocar was introduced to deflect the bladder neck to the right side and special TVT needle was introduced on the left side through the vaginal incision, and hugging the backside of the pubic symphysis.  The needle tip came out at the level of previously marked site of pubic tubercle site.  The needle was held with the help of hemostat and it was removed from the handle and then the green tube was attached to the needle and pulled in the suprapubic side, leaving the green tube in place.  The catheter was removed and the right-angle cystoscope was introduced into the bladder. The bladder was filled up with 300 mL of saline and it is inspected. The green tube was outside the bladder.  Now, the vaginal tape was pulled by pulling the green tube in the suprapubic site and I tried  to keep the vaginal tape flat.  It was pulled to the level of the previously marked site.  Similarly, the left limb of the tape was introduced and inspected the bladder also on that side with the right angle lens.  The tape and the green tube was outside the bladder and now with the Mayo scissor in between the urethra and the tape, both limbs of the tape was pulled in the suprapubic site to remove the redundancy from the tape and the tape was lying very loosely on top of the Mayo scissor and it is flat.  There was little bleeding coming out from the right side, so I put a pressure with the help of a __________ and the bleeding subsided.  The wound was irrigated with saline and the Mayo scissor was removed.  Now, the tape was gently lying against the urethra.  The vaginal incision was closed with couple of stitches of 3-0 Vicryl.  The vagina __________ packing was removed at the end of the case.  There was no bleeding from the vaginal incision.  In the suprapubic area, the tape was cut, flushed with the skin, and the skin incision was simply  closed with Dermabond.  The patient was stable.  The Foley catheter was also removed.  The patient left the operating room in satisfactory condition.     Ky Barban, M.D.     MIJ/MEDQ  D:  01/16/2012  T:  01/17/2012  Job:  [id number]

## 2012-01-18 ENCOUNTER — Encounter (HOSPITAL_COMMUNITY): Payer: Self-pay | Admitting: Urology

## 2012-01-21 ENCOUNTER — Emergency Department (HOSPITAL_COMMUNITY)
Admission: EM | Admit: 2012-01-21 | Discharge: 2012-01-21 | Disposition: A | Discharge status: Home / Self Care | Payer: Medicaid Other | Attending: Emergency Medicine | Admitting: Emergency Medicine

## 2012-01-21 ENCOUNTER — Emergency Department (HOSPITAL_COMMUNITY): Payer: Medicaid Other

## 2012-01-21 ENCOUNTER — Encounter (HOSPITAL_COMMUNITY): Payer: Self-pay | Admitting: Emergency Medicine

## 2012-01-21 DIAGNOSIS — R109 Unspecified abdominal pain: Secondary | ICD-10-CM | POA: Insufficient documentation

## 2012-01-21 DIAGNOSIS — G43909 Migraine, unspecified, not intractable, without status migrainosus: Secondary | ICD-10-CM | POA: Insufficient documentation

## 2012-01-21 DIAGNOSIS — M129 Arthropathy, unspecified: Secondary | ICD-10-CM | POA: Insufficient documentation

## 2012-01-21 DIAGNOSIS — F319 Bipolar disorder, unspecified: Secondary | ICD-10-CM | POA: Insufficient documentation

## 2012-01-21 DIAGNOSIS — K649 Unspecified hemorrhoids: Secondary | ICD-10-CM | POA: Insufficient documentation

## 2012-01-21 DIAGNOSIS — R509 Fever, unspecified: Secondary | ICD-10-CM | POA: Insufficient documentation

## 2012-01-21 DIAGNOSIS — Z79899 Other long term (current) drug therapy: Secondary | ICD-10-CM | POA: Insufficient documentation

## 2012-01-21 DIAGNOSIS — R209 Unspecified disturbances of skin sensation: Secondary | ICD-10-CM | POA: Insufficient documentation

## 2012-01-21 DIAGNOSIS — R112 Nausea with vomiting, unspecified: Secondary | ICD-10-CM | POA: Insufficient documentation

## 2012-01-21 DIAGNOSIS — B182 Chronic viral hepatitis C: Secondary | ICD-10-CM | POA: Insufficient documentation

## 2012-01-21 DIAGNOSIS — K5900 Constipation, unspecified: Secondary | ICD-10-CM | POA: Insufficient documentation

## 2012-01-21 DIAGNOSIS — E7800 Pure hypercholesterolemia, unspecified: Secondary | ICD-10-CM | POA: Insufficient documentation

## 2012-01-21 DIAGNOSIS — F411 Generalized anxiety disorder: Secondary | ICD-10-CM | POA: Insufficient documentation

## 2012-01-21 DIAGNOSIS — F17200 Nicotine dependence, unspecified, uncomplicated: Secondary | ICD-10-CM | POA: Insufficient documentation

## 2012-01-21 DIAGNOSIS — K219 Gastro-esophageal reflux disease without esophagitis: Secondary | ICD-10-CM | POA: Insufficient documentation

## 2012-01-21 LAB — URINALYSIS, ROUTINE W REFLEX MICROSCOPIC
Bilirubin Urine: NEGATIVE
Leukocytes, UA: NEGATIVE
Nitrite: NEGATIVE
Specific Gravity, Urine: 1.025 (ref 1.005–1.030)
Urobilinogen, UA: 0.2 mg/dL (ref 0.0–1.0)
pH: 7 (ref 5.0–8.0)

## 2012-01-21 LAB — CBC WITH DIFFERENTIAL/PLATELET
Basophils Absolute: 0 K/uL (ref 0.0–0.1)
Basophils Relative: 0 % (ref 0–1)
Eosinophils Absolute: 0 K/uL (ref 0.0–0.7)
Eosinophils Relative: 0 % (ref 0–5)
HCT: 32.8 % — ABNORMAL LOW (ref 36.0–46.0)
Hemoglobin: 11.2 g/dL — ABNORMAL LOW (ref 12.0–15.0)
Lymphocytes Relative: 11 % — ABNORMAL LOW (ref 12–46)
Lymphs Abs: 1 K/uL (ref 0.7–4.0)
MCH: 29.6 pg (ref 26.0–34.0)
MCHC: 34.1 g/dL (ref 30.0–36.0)
MCV: 86.8 fL (ref 78.0–100.0)
Monocytes Absolute: 0.5 K/uL (ref 0.1–1.0)
Monocytes Relative: 5 % (ref 3–12)
Neutro Abs: 7.6 K/uL (ref 1.7–7.7)
Neutrophils Relative %: 83 % — ABNORMAL HIGH (ref 43–77)
Platelets: 191 K/uL (ref 150–400)
RBC: 3.78 MIL/uL — ABNORMAL LOW (ref 3.87–5.11)
RDW: 14.1 % (ref 11.5–15.5)
WBC: 9.1 K/uL (ref 4.0–10.5)

## 2012-01-21 LAB — COMPREHENSIVE METABOLIC PANEL WITH GFR
ALT: 20 U/L (ref 0–35)
AST: 18 U/L (ref 0–37)
Albumin: 2.7 g/dL — ABNORMAL LOW (ref 3.5–5.2)
Alkaline Phosphatase: 77 U/L (ref 39–117)
BUN: 11 mg/dL (ref 6–23)
CO2: 23 meq/L (ref 19–32)
Calcium: 8.4 mg/dL (ref 8.4–10.5)
Chloride: 100 meq/L (ref 96–112)
Creatinine, Ser: 0.66 mg/dL (ref 0.50–1.10)
GFR calc Af Amer: >90 mL/min
GFR calc non Af Amer: >90 mL/min
Glucose, Bld: 125 mg/dL — ABNORMAL HIGH (ref 70–99)
Potassium: 3.4 meq/L — ABNORMAL LOW (ref 3.5–5.1)
Sodium: 132 meq/L — ABNORMAL LOW (ref 135–145)
Total Bilirubin: 0.3 mg/dL (ref 0.3–1.2)
Total Protein: 6.6 g/dL (ref 6.0–8.3)

## 2012-01-21 MED ORDER — ONDANSETRON HCL 4 MG/2ML IJ SOLN
4.0000 mg | Freq: Once | INTRAMUSCULAR | Status: AC
  Administered 2012-01-21: 4 mg via INTRAVENOUS
  Filled 2012-01-21: qty 2

## 2012-01-21 MED ORDER — TECHNETIUM TC 99M DIETHYLENETRIAME-PENTAACETIC ACID
40.0000 | Freq: Once | INTRAVENOUS | Status: AC | PRN
  Administered 2012-01-21: 36 via RESPIRATORY_TRACT

## 2012-01-21 MED ORDER — TECHNETIUM TO 99M ALBUMIN AGGREGATED
6.0000 | Freq: Once | INTRAVENOUS | Status: AC | PRN
  Administered 2012-01-21: 6 via INTRAVENOUS

## 2012-01-21 MED ORDER — TECHNETIUM TC 99M DIETHYLENETRIAME-PENTAACETIC ACID: 40.0000 | Freq: Once | INTRAVENOUS | Status: DC | PRN

## 2012-01-21 MED ORDER — HYDROMORPHONE HCL PF 1 MG/ML IJ SOLN
1.0000 mg | Freq: Once | INTRAMUSCULAR | Status: AC
  Administered 2012-01-21: 1 mg via INTRAVENOUS
  Filled 2012-01-21: qty 1

## 2012-01-21 MED ORDER — SODIUM CHLORIDE 0.9 % IV BOLUS (SEPSIS)
1000.0000 mL | Freq: Once | INTRAVENOUS | Status: AC
  Administered 2012-01-21: 1000 mL via INTRAVENOUS

## 2012-01-21 NOTE — ED Notes (Signed)
Pt c/o abdominal pain and nausea x 2 days. Pt stated she had bladder sx 01/16/12 and the dr told her to seek medical care if she started having pain or temp. Pt stated her temp at home was 108.0 oral. Pt also c/o numbess from elbow to hands builat and from knees to toes bilat.
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
## 2012-01-21 NOTE — ED Notes (Signed)
Patient with no complaints at this time. Respirations even and unlabored. Skin warm/dry. Discharge instructions reviewed with patient at this time. Patient given opportunity to voice concerns/ask questions. I Patient discharged at this time and left Emergency Department with steady gait.   

## 2012-01-29 ENCOUNTER — Observation Stay (HOSPITAL_COMMUNITY)
Admission: AD | Admit: 2012-01-29 | Discharge: 2012-01-31 | Disposition: A | Discharge status: Home / Self Care | Payer: Medicaid Other | Source: Ambulatory Visit | Attending: Urology | Admitting: Urology

## 2012-01-29 ENCOUNTER — Encounter (HOSPITAL_COMMUNITY): Payer: Self-pay

## 2012-01-29 ENCOUNTER — Encounter: Payer: Self-pay | Admitting: *Deleted

## 2012-01-29 DIAGNOSIS — IMO0002 Reserved for concepts with insufficient information to code with codable children: Principal | ICD-10-CM | POA: Insufficient documentation

## 2012-01-29 DIAGNOSIS — Y838 Other surgical procedures as the cause of abnormal reaction of the patient, or of later complication, without mention of misadventure at the time of the procedure: Secondary | ICD-10-CM | POA: Insufficient documentation

## 2012-01-29 LAB — BASIC METABOLIC PANEL
CO2: 29 mEq/L (ref 19–32)
Calcium: 9.2 mg/dL (ref 8.4–10.5)
GFR calc non Af Amer: >90 mL/min (ref >90)
Potassium: 3.8 mEq/L (ref 3.5–5.1)
Sodium: 138 mEq/L (ref 135–145)

## 2012-01-29 LAB — CBC WITH DIFFERENTIAL/PLATELET
Basophils Absolute: 0 10*3/uL (ref 0.0–0.1)
Eosinophils Absolute: 0.1 10*3/uL (ref 0.0–0.7)
Eosinophils Relative: 2 % (ref 0–5)
Lymphocytes Relative: 24 % (ref 12–46)
MCV: 86.9 fL (ref 78.0–100.0)
Neutrophils Relative %: 69 % (ref 43–77)
Platelets: 348 10*3/uL (ref 150–400)
RBC: 3.74 MIL/uL — ABNORMAL LOW (ref 3.87–5.11)
RDW: 14.1 % (ref 11.5–15.5)
WBC: 6.6 10*3/uL (ref 4.0–10.5)

## 2012-01-29 LAB — MRSA PCR SCREENING: MRSA by PCR: NEGATIVE

## 2012-01-29 MED ORDER — AMPHETAMINE-DEXTROAMPHET ER 30 MG PO CP24
30.0000 mg | ORAL_CAPSULE | Freq: Every day | ORAL | Status: DC
  Administered 2012-01-30 – 2012-01-31 (×2): 30 mg via ORAL
  Filled 2012-01-29 (×2): qty 1

## 2012-01-29 MED ORDER — PANTOPRAZOLE SODIUM 40 MG PO TBEC
40.0000 mg | DELAYED_RELEASE_TABLET | Freq: Every day | ORAL | Status: DC
  Administered 2012-01-30 – 2012-01-31 (×2): 40 mg via ORAL
  Filled 2012-01-29 (×2): qty 1

## 2012-01-29 MED ORDER — OXYCODONE-ACETAMINOPHEN 5-325 MG PO TABS
1.0000 | ORAL_TABLET | Freq: Four times a day (QID) | ORAL | Status: DC | PRN
  Administered 2012-01-29 – 2012-01-30 (×5): 1 via ORAL
  Filled 2012-01-29 (×5): qty 1

## 2012-01-29 MED ORDER — DEXTROSE-NACL 5-0.45 % IV SOLN
INTRAVENOUS | Status: DC
  Administered 2012-01-29 – 2012-01-30 (×3): via INTRAVENOUS

## 2012-01-29 MED ORDER — AMLODIPINE BESYLATE 5 MG PO TABS
5.0000 mg | ORAL_TABLET | Freq: Every day | ORAL | Status: DC
  Administered 2012-01-30 – 2012-01-31 (×2): 5 mg via ORAL
  Filled 2012-01-29 (×2): qty 1

## 2012-01-29 MED ORDER — ALPRAZOLAM 1 MG PO TABS
1.0000 mg | ORAL_TABLET | Freq: Four times a day (QID) | ORAL | Status: DC
  Administered 2012-01-29 – 2012-01-31 (×9): 1 mg via ORAL
  Filled 2012-01-29 (×10): qty 1

## 2012-01-29 MED ORDER — VANCOMYCIN HCL IN DEXTROSE 1-5 GM/200ML-% IV SOLN
1000.0000 mg | Freq: Two times a day (BID) | INTRAVENOUS | Status: DC
  Administered 2012-01-29 – 2012-01-31 (×5): 1000 mg via INTRAVENOUS
  Filled 2012-01-29 (×6): qty 200

## 2012-01-29 MED ORDER — LUBIPROSTONE 24 MCG PO CAPS
24.0000 ug | ORAL_CAPSULE | Freq: Two times a day (BID) | ORAL | Status: DC
  Administered 2012-01-30 – 2012-01-31 (×4): 24 ug via ORAL
  Filled 2012-01-29 (×8): qty 1

## 2012-01-29 MED ORDER — CYCLOBENZAPRINE HCL 10 MG PO TABS
10.0000 mg | ORAL_TABLET | Freq: Every day | ORAL | Status: DC
  Administered 2012-01-29: 10 mg via ORAL
  Filled 2012-01-29: qty 1

## 2012-01-29 MED ORDER — ZIPRASIDONE HCL 80 MG PO CAPS
80.0000 mg | ORAL_CAPSULE | Freq: Every day | ORAL | Status: DC
  Administered 2012-01-29 – 2012-01-30 (×2): 80 mg via ORAL
  Filled 2012-01-29 (×4): qty 1

## 2012-01-29 NOTE — H&P (Signed)
[name], SALVAGE                 ACCOUNT NO.:  [id number]  MEDICAL RECORD NO.:  [id number]  LOCATION:  A202                          FACILITY:  [facility]  PHYSICIAN:  Ky Barban, M.D.DATE OF BIRTH:  1960-10-03  DATE OF ADMISSION:  01/29/2012 DATE OF DISCHARGE:  LH                             HISTORY & PHYSICAL   CHIEF COMPLAINT:  Hematoma, suprapubic area.  HISTORY:  This patient, who is 51 year old, underwent an tension-free vaginal tape on January 16, 2012.  She did fine during surgery, but postoperatively when she came to see me 1 week later, she says she is voiding fine, but she has developed painful swelling, and on examination, I find out on last Monday that she has a large hematoma at the site of the right suprapubic wound, but there was no drainage.  The area appeared to be red and hot and tender, so I put her on antibiotic Levaquin with heating pad.  Then, she called me Friday that she is draining blood from that site.  No vaginal drainage.  She is not having any stress incontinence.  She is voiding fine, so I offered her to come to the emergency room, or she can clean that area and put a dressing on top of that, she elected to do that, continue taking antibiotic and if it became worse let me know.  So, she comes in today.  She is still having painful swelling on that side.  On examination, hematoma is still there.  It is not draining.  The opening is very small.  I took a culture from there.  I want to put her on IV antibiotic, so I decided to admit her in the hospital, so I can given IV vancomycin.  She has no urinary complaints.  No fever or chills.  PAST MEDICAL HISTORY:  She had tubal ligation done in 1985.  She recently underwent uterine ablation.  No history of diabetes or hypertension.  REVIEW OF SYSTEMS:  Unremarkable on examination.  PERSONAL HISTORY:  She smokes 1 pack twice a week.  Does not drink any alcohol or take any illicit drugs.  ALLERGIES:   To penicillin.  PHYSICAL EXAMINATION:  GENERAL:  Moderately built female, not in acute distress. VITAL SIGNS:  Blood pressure 150/80, temperature is normal. CENTRAL NERVOUS SYSTEM:  No gross neurological deficit. HEAD, NECK, ENT:  Negative. CHEST:  Symmetrical. HEART:  Regular sinus rhythm. ABDOMEN:  Soft, flat.  Liver, spleen, kidneys are not palpable.  There is a hard indurated area at the site of the right suprapubic incision through which the tape comes out, but the tape has been cut flush, it is under the skin.  The area is about 1 x 1 inch and it is hard and indurated.  I do not feel any fluctuation. PELVIC:  Deferred.  She is not having any vaginal discharge or bleeding.  IMPRESSION:  Hematoma, right side in the suprapubic area.  PLAN:  Culture from the hematoma site and admitted for observation. Start her on IV vancomycin.     Ky Barban, M.D.     MIJ/MEDQ  D:  01/29/2012  T:  01/29/2012  Job:  [id number]

## 2012-01-29 NOTE — Progress Notes (Signed)
ANTIBIOTIC CONSULT NOTE - INITIAL  Pharmacy Consult for Vancomycin Indication: infection RLQ, urological  Allergies  Allergen Reactions  . Penicillins Anaphylaxis    Patient Measurements: Height: 5\' 5"  (165.1 cm) Weight: 165 lb 5.5 oz (75 kg) IBW/kg (Calculated) : 57   Vital Signs: Temp: 98.2 F (36.8 C) (12/02 1357) Temp src: Oral (12/02 1357) BP: 102/68 mmHg (12/02 1357) Pulse Rate: 85  (12/02 1357) Intake/Output from previous day:   Intake/Output from this shift:    Labs: No results found for this basename: WBC:3,HGB:3,PLT:3,LABCREA:3,CREATININE:3 in the last 72 hours Estimated Creatinine Clearance: 85.3 ml/min (by C-G formula based on Cr of 0.66). No results found for this basename: VANCOTROUGH:2,VANCOPEAK:2,VANCORANDOM:2,GENTTROUGH:2,GENTPEAK:2,GENTRANDOM:2,TOBRATROUGH:2,TOBRAPEAK:2,TOBRARND:2,AMIKACINPEAK:2,AMIKACINTROU:2,AMIKACIN:2, in the last 72 hours   Microbiology: Recent Results (from the past 720 hour(s))  SURGICAL PCR SCREEN     Status: Abnormal   Collection Time   01/11/12 12:44 PM      Component Value Range Status Comment   MRSA, PCR POSITIVE (*) NEGATIVE Final    Staphylococcus aureus POSITIVE (*) NEGATIVE Final     Medical History: Past Medical History  Diagnosis Date  . Hypercholesterolemia   . GERD (gastroesophageal reflux disease)   . Positive PPD     Treated with INH - did not tolerate  . S/P colonoscopy June 2009    Dr. Jena Gauss: anal canal hemorrhoids  . Migraines   . Constipation   . Hemorrhoids   . S/P endoscopy Aug 2012    Few tiny distal esophageal erosions, small hiatal hernia,  . Essential hypertension, benign   . Shortness of breath 08/28/2011  . Anxiety   . Bipolar disorder personality disorder  . Hepatitis C 2012    Followed by Dr. Jacqualine Mau, immune to Hepatitis A and B  . Hepatitis C, chronic     stage 2 fibrosis on liver biopsy 04/2011.  . Arthritis   . H/O hiatal hernia     Medications:  Scheduled:    . vancomycin  1,000  mg Intravenous Q12H   Assessment: 51yo female with good renal fxn.  Estimated Creatinine Clearance: 85.3 ml/min (by C-G formula based on Cr of 0.66).   Goal of Therapy:  Vancomycin trough level 10-15 mcg/ml  Plan: Vancomycin 1gm iv q12hrs Check trough level at steady state Monitor labs, renal fxn, and cultures per protocol Duration of therapy per MD  Valrie Hart A 01/29/2012,3:45 PM

## 2012-01-30 LAB — CBC WITH DIFFERENTIAL/PLATELET
Basophils Absolute: 0 10*3/uL (ref 0.0–0.1)
Basophils Relative: 1 % (ref 0–1)
Eosinophils Absolute: 0.3 10*3/uL (ref 0.0–0.7)
HCT: 34 % — ABNORMAL LOW (ref 36.0–46.0)
MCH: 28.7 pg (ref 26.0–34.0)
MCHC: 32.9 g/dL (ref 30.0–36.0)
Monocytes Absolute: 0.3 10*3/uL (ref 0.1–1.0)
Monocytes Relative: 4 % (ref 3–12)
Neutro Abs: 5.7 10*3/uL (ref 1.7–7.7)
Neutrophils Relative %: 71 % (ref 43–77)
RDW: 13.9 % (ref 11.5–15.5)

## 2012-01-30 LAB — URINE CULTURE: Colony Count: NO GROWTH

## 2012-01-30 MED ORDER — ONDANSETRON HCL 4 MG PO TABS
4.0000 mg | ORAL_TABLET | Freq: Four times a day (QID) | ORAL | Status: DC | PRN
  Administered 2012-01-30: 4 mg via ORAL
  Filled 2012-01-30: qty 1

## 2012-01-30 MED ORDER — MAGNESIUM HYDROXIDE 400 MG/5ML PO SUSP
60.0000 mL | Freq: Once | ORAL | Status: AC
  Administered 2012-01-30: 60 mL via ORAL
  Filled 2012-01-30: qty 60

## 2012-01-30 MED ORDER — OXYCODONE-ACETAMINOPHEN 5-325 MG PO TABS
1.0000 | ORAL_TABLET | ORAL | Status: DC | PRN
  Administered 2012-01-30 – 2012-01-31 (×6): 1 via ORAL
  Filled 2012-01-30 (×6): qty 1

## 2012-01-30 MED ORDER — FLEET ENEMA 7-19 GM/118ML RE ENEM
1.0000 | ENEMA | Freq: Once | RECTAL | Status: AC
  Administered 2012-01-30: 1 via RECTAL

## 2012-01-30 MED ORDER — CYCLOBENZAPRINE HCL 10 MG PO TABS
20.0000 mg | ORAL_TABLET | Freq: Every day | ORAL | Status: DC
  Administered 2012-01-30: 20 mg via ORAL
  Filled 2012-01-30: qty 2

## 2012-01-30 MED ORDER — VANCOMYCIN HCL IN DEXTROSE 1-5 GM/200ML-% IV SOLN: 1000.0000 mg | INTRAVENOUS | Status: DC

## 2012-01-30 NOTE — Progress Notes (Signed)
Note [phone]

## 2012-01-30 NOTE — Progress Notes (Signed)
UR chart review completed.  

## 2012-01-30 NOTE — Discharge Summary (Signed)
  dictation#[id number]

## 2012-01-30 NOTE — Progress Notes (Signed)
01/30/12 1845 Patient addressed concerns regarding frequency of percocet and her flexeril dose with Dr Jerre Simon on rounds this evening, orders placed as given. Discussed need for VTE prophylaxis, order received for SCDs. Dressing to suprapubic area changed this evening per Dr Jerre Simon request, site with minimal, oozing, yellow drainage. Dressing dry and intact at this time. Discussed need for admission/observation status order, no orders addressed per Dr Jerre Simon. States would like patient to discharge home and return for 7 days of vancomycin IV as outpatient. Patient concerned regarding transport, states will need [facility] transportation set up. Notified patient that Child psychotherapist and case management will return tomorrow to help set up discharge needs. Dr Jerre Simon would like patient to have PICC line placed in the morning. Earnstine Regal, RN

## 2012-01-30 NOTE — Progress Notes (Signed)
YNWG#[id number]

## 2012-01-30 NOTE — Care Management Note (Signed)
    Page 1 of 2   01/31/2012     11:55:38 AM   CARE MANAGEMENT NOTE 01/31/2012  Patient:  [name], [name]   Account Number:  [id number]  Date Initiated:  01/30/2012  Documentation initiated by:  Sharrie Rothman  Subjective/Objective Assessment:   Pt admitted from home with hematoma to suprapubic area. Pt had just had surgery about 3 weeks ago and then developed bleeding and a hematoma. Pt lives with her husband and will return home at discharge. Pt is independent with ADL's.     Action/Plan:   Will continue to follow for [facility] needs.   Anticipated DC Date:  01/30/2012   Anticipated DC Plan:  HOME/SELF CARE      DC Planning Services  CM consult      [facility] Choice  HOME HEALTH   Choice offered to / List presented to:  C-1 Patient        HH arranged  HH-1 RN  IV Antibiotics      HH agency  [facility]   Status of service:  Completed, signed off Medicare Important Message given?   (If response is "NO", the following Medicare IM given date fields will be blank) Date Medicare IM given:   Date Additional Medicare IM given:    Discharge Disposition:  HOME W HOME HEALTH SERVICES  Per UR Regulation:    If discussed at Long Length of Stay Meetings, dates discussed:    Comments:  01/31/12 1153 Arlyss Queen, RN BSN CM Pt discharged home today with IVAB and [facility]. Pts husband, [name], has agreed to learn how to administer IV AB. Alroy Bailiff of [facility] is aware and will collect the pts information from the chart. No DME needs noted. Pt and pts nurse aware of discharge arrangements. HH will begin on 02/01/12.  01/30/12 1326 Arlyss Queen, RN BSN CM

## 2012-01-30 NOTE — Discharge Summary (Signed)
Dictation#[id number]

## 2012-01-30 NOTE — Progress Notes (Signed)
01/30/12 1400 Patient c/o feeling nauseated and constipated. Notified Dr Jerre Simon, orders received for milk of magnesia for constipation and zofran for nausea, see orders. Also notified him of patient not having VTE prophylaxis or admission order (as requested per Case Management).  No orders received, stated would see patient today to assess needs and discharge plan. Earnstine Regal, RN

## 2012-01-31 ENCOUNTER — Observation Stay (HOSPITAL_COMMUNITY): Payer: Medicaid Other

## 2012-01-31 ENCOUNTER — Encounter (HOSPITAL_COMMUNITY): Payer: Medicaid Other

## 2012-01-31 MED ORDER — SODIUM CHLORIDE 0.9 % IJ SOLN: 10.0000 mL | Freq: Two times a day (BID) | INTRAMUSCULAR | Status: DC

## 2012-01-31 MED ORDER — SODIUM CHLORIDE 0.9 % IJ SOLN
10.0000 mL | INTRAMUSCULAR | Status: DC | PRN
  Administered 2012-01-31: 10 mL

## 2012-01-31 NOTE — Progress Notes (Signed)
[name], [name]                 ACCOUNT NO.:  [id number]  MEDICAL RECORD NO.:  [id number]  LOCATION:  A202                          FACILITY:  [facility]  PHYSICIAN:  Ky Barban, M.D.DATE OF BIRTH:  12-30-1960  DATE OF PROCEDURE: DATE OF DISCHARGE:                                PROGRESS NOTE   The patient complains of less pain.  The area looks less red.  She has been started on vancomycin IV 1 g twice a day.  WBC count looks normal, it is 8.8 and hematocrit has come up from 32.5 to 34.  Her blood pressure is 132/89, O2 saturation 98%.  She is afebrile.  My plan is that I want to discharge her home, so she can come as outpatient to continue vancomycin as outpatient.     Ky Barban, M.D.     MIJ/MEDQ  D:  01/30/2012  T:  01/31/2012  Job:  [id number]

## 2012-01-31 NOTE — Progress Notes (Signed)
UR chart review completed.  

## 2012-01-31 NOTE — Discharge Summary (Signed)
[name], [name]                 ACCOUNT NO.:  [id number]  MEDICAL RECORD NO.:  [id number]  LOCATION:  A202                          FACILITY:  [facility]  PHYSICIAN:  Ky Barban, M.D.DATE OF BIRTH:  11-04-1960  DATE OF ADMISSION:  01/29/2012 DATE OF DISCHARGE:  LH                              DISCHARGE SUMMARY   This patient was brought as outpatient yesterday, which is January 29, 2012, and being discharged tomorrow, which is January 30, 2013.  She, last month, underwent a tension-free vaginal tape.  She is voiding satisfactorily.  Has no incontinent.  Is happy for that part, but she developed a large hematoma below the abdominal skin incision on the right side, and I was concerned that it might get infected, so I took a culture from that area, although it was not draining.  The hematoma did drain, but now it stopped, so I took culture from that site.  Culture did not grow anything subsequently, but put her on vancomycin intravenously.  She is getting vancomycin 1 g IV twice a day.  Her white count is normal.  It is 8,000.  Hematocrit, which was 32.6, has come up to 34%.  At this point, she says she is feeling better although that swelling is still there.  It is not as red, and it is still tender.  I think she needs to have the IV vancomycin for 7 days, but it can be done as outpatient.  I will discharge her, and she will come back and if possible get vancomycin once a day for the next 7 days, and I will see her back in the office in 1 week.  I have told her that if there is no drainage she does not have to cover it with any dressing.  She can take a shower.  If there is a drainage, then she should keep it clean and covered and let me know.  She is taking several other medicines for her depression.  I told her that she can continue taking those medicines as outpatient.  Urine cultures are still pending.  She is complaining of constipation.  We are going to go ahead and give her  Fleets Enema before she leaves.  FINAL DISCHARGE DIAGNOSES:  Post tension-free vaginal tape for stress incontinence, hematoma on the right side.  DISCHARGE CONDITION:  Improved.  DISCHARGE MEDICATIONS:  She will have vancomycin IV as outpatient.  She will continue taking Percocet, which she already has, as outpatient.  I am going to see her back in the office in 1 week.     Ky Barban, M.D.     MIJ/MEDQ  D:  01/30/2012  T:  01/31/2012  Job:  [id number]

## 2012-01-31 NOTE — Plan of Care (Signed)
Problem: Discharge Progression Outcomes Goal: Complications resolved/controlled Outcome: Adequate for Discharge Home IV antibiotics ordered Goal: Other Discharge Outcomes/Goals Outcome: Completed/Met Date Met:  01/31/12 Pt discharged to home with spouse.  PICC Rt upper arm dressing intact Port line is clamped

## 2012-01-31 NOTE — Progress Notes (Signed)
NAMEZAMYA, CULHANE                 ACCOUNT NO.:  [id number]  MEDICAL RECORD NO.:  [id number]  LOCATION:  A202                          FACILITY:  [facility]  PHYSICIAN:  Ky Barban, M.D.DATE OF BIRTH:  1960/10/12  DATE OF PROCEDURE:  01/30/2012 DATE OF DISCHARGE:                                PROGRESS NOTE   She is doing fine.  I think she can be discharged in the morning and come back and have IV vancomycin as outpatient for the next 7 days.  As far as prophylaxis for DVT, she is being treated for hematoma, so I do not want to give her any medications.  I have told the nurse she can use [employer]tr[employer].  I am going to see her back in the office in couple of weeks. I am going to write the discharge orders.     Ky Barban, M.D.     MIJ/MEDQ  D:  01/30/2012  T:  01/31/2012  Job:  [id number]

## 2012-02-01 ENCOUNTER — Telehealth: Payer: Self-pay | Admitting: Gastroenterology

## 2012-02-01 LAB — WOUND CULTURE

## 2012-02-01 NOTE — Telephone Encounter (Signed)
Patient used to see Dr. Jacqualine Mau at hepatitis clinic but last seen in 03/2011. Never started on treatment.   Let's get her back in to the hepatitis clinic in [place] for f/u.

## 2012-02-01 NOTE — Telephone Encounter (Signed)
Referral has been faxed to Hep C clinic in [place] and they will contact the patient with date & time

## 2012-02-12 ENCOUNTER — Encounter: Payer: Self-pay | Admitting: Internal Medicine

## 2012-02-13 ENCOUNTER — Ambulatory Visit: Payer: Medicaid Other | Admitting: Gastroenterology

## 2012-02-29 ENCOUNTER — Other Ambulatory Visit: Payer: Self-pay | Admitting: Gastroenterology

## 2012-03-06 ENCOUNTER — Ambulatory Visit (INDEPENDENT_AMBULATORY_CARE_PROVIDER_SITE_OTHER): Payer: Medicaid Other | Admitting: Gastroenterology

## 2012-03-06 ENCOUNTER — Encounter: Payer: Self-pay | Admitting: Gastroenterology

## 2012-03-06 VITALS — BP 127/87 | HR 98 | Temp 97.4°F | Ht 62.0 in | Wt 158.4 lb

## 2012-03-06 DIAGNOSIS — D509 Iron deficiency anemia, unspecified: Secondary | ICD-10-CM

## 2012-03-06 DIAGNOSIS — K219 Gastro-esophageal reflux disease without esophagitis: Secondary | ICD-10-CM

## 2012-03-06 DIAGNOSIS — K5900 Constipation, unspecified: Secondary | ICD-10-CM

## 2012-03-06 DIAGNOSIS — B182 Chronic viral hepatitis C: Secondary | ICD-10-CM

## 2012-03-06 MED ORDER — LINACLOTIDE 290 MCG PO CAPS: 1.0000 | ORAL_CAPSULE | Freq: Every day | ORAL | Status: DC

## 2012-03-06 NOTE — Patient Instructions (Addendum)
Stop Amitiza. Start taking Linzess 1 capsule 30 minutes BEFORE a meal daily. This is for constipation. I have given you samples and a voucher for a month supply free.   Please have blood work done when you are able.   We will see you back in three months. I will let you know if you need further vaccinations.
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
## 2012-03-07 LAB — CBC WITH DIFFERENTIAL/PLATELET
Basophils Absolute: 0.1 10*3/uL (ref 0.0–0.1)
Basophils Relative: 1 % (ref 0–1)
Eosinophils Absolute: 0.1 10*3/uL (ref 0.0–0.7)
Hemoglobin: 13.2 g/dL (ref 12.0–15.0)
MCH: 28.8 pg (ref 26.0–34.0)
MCHC: 33.8 g/dL (ref 30.0–36.0)
Monocytes Relative: 4 % (ref 3–12)
Neutro Abs: 4.9 10*3/uL (ref 1.7–7.7)
Neutrophils Relative %: 62 % (ref 43–77)
Platelets: 268 10*3/uL (ref 150–400)

## 2012-03-07 LAB — HEPATIC FUNCTION PANEL
Albumin: 4.4 g/dL (ref 3.5–5.2)
Bilirubin, Direct: 0.1 mg/dL (ref 0.0–0.3)
Total Bilirubin: 0.3 mg/dL (ref 0.3–1.2)

## 2012-03-07 LAB — AFP TUMOR MARKER: AFP-Tumor Marker: 2.5 ng/mL (ref 0.0–8.0)

## 2012-03-07 LAB — FERRITIN: Ferritin: 57 ng/mL (ref 10–291)

## 2012-03-10 DIAGNOSIS — B182 Chronic viral hepatitis C: Secondary | ICD-10-CM | POA: Insufficient documentation

## 2012-03-10 NOTE — Assessment & Plan Note (Signed)
TCS/EGD without evidence for IDA, Hgb and iron studies improving since endometrial ablation. ifobt negative. Doubt GI source. Obtain updated CBC, iron, ferritin.

## 2012-03-10 NOTE — Assessment & Plan Note (Signed)
Now plugged into [facility] clinic. Will be starting new FDA approved med in future. Obtain most recent notes. I did not think she needed Hep A and B vaccinations, but I will review her chart again. AFP due now.

## 2012-03-10 NOTE — Assessment & Plan Note (Signed)
Some improvement on Amitiza, now feels not working as well. Trial of Linzess.

## 2012-03-10 NOTE — Assessment & Plan Note (Signed)
Controlled with Prilosec. Continue current regimen.

## 2012-03-11 NOTE — Progress Notes (Signed)
Faxed to PCP

## 2012-03-14 NOTE — Progress Notes (Signed)
Quick Note:  Hgb normal, ferritin normal and continues to improve.  HFP looks great. AFP normal. Repeat in 6 mos. ______

## 2012-03-18 NOTE — Progress Notes (Signed)
Quick Note:  Pt is aware. ______ 

## 2012-03-18 NOTE — Progress Notes (Signed)
Quick Note:  Tried to call pt- LMOM ______ 

## 2012-03-21 ENCOUNTER — Other Ambulatory Visit: Payer: Self-pay | Admitting: Gastroenterology

## 2012-03-21 DIAGNOSIS — B182 Chronic viral hepatitis C: Secondary | ICD-10-CM

## 2012-03-21 NOTE — Progress Notes (Signed)
Quick Note:  Just the AFP. ______

## 2012-04-03 ENCOUNTER — Encounter (HOSPITAL_COMMUNITY): Payer: Self-pay

## 2012-04-03 ENCOUNTER — Emergency Department (HOSPITAL_COMMUNITY)
Admission: EM | Admit: 2012-04-03 | Discharge: 2012-04-03 | Disposition: A | Discharge status: Home / Self Care | Payer: Medicaid Other | Attending: Emergency Medicine | Admitting: Emergency Medicine

## 2012-04-03 DIAGNOSIS — Y939 Activity, unspecified: Secondary | ICD-10-CM | POA: Insufficient documentation

## 2012-04-03 DIAGNOSIS — R300 Dysuria: Secondary | ICD-10-CM | POA: Insufficient documentation

## 2012-04-03 DIAGNOSIS — G43909 Migraine, unspecified, not intractable, without status migrainosus: Secondary | ICD-10-CM | POA: Insufficient documentation

## 2012-04-03 DIAGNOSIS — Y929 Unspecified place or not applicable: Secondary | ICD-10-CM | POA: Insufficient documentation

## 2012-04-03 DIAGNOSIS — X58XXXA Exposure to other specified factors, initial encounter: Secondary | ICD-10-CM | POA: Insufficient documentation

## 2012-04-03 DIAGNOSIS — R319 Hematuria, unspecified: Secondary | ICD-10-CM | POA: Insufficient documentation

## 2012-04-03 DIAGNOSIS — F319 Bipolar disorder, unspecified: Secondary | ICD-10-CM | POA: Insufficient documentation

## 2012-04-03 DIAGNOSIS — K219 Gastro-esophageal reflux disease without esophagitis: Secondary | ICD-10-CM | POA: Insufficient documentation

## 2012-04-03 DIAGNOSIS — Z79899 Other long term (current) drug therapy: Secondary | ICD-10-CM | POA: Insufficient documentation

## 2012-04-03 DIAGNOSIS — F17200 Nicotine dependence, unspecified, uncomplicated: Secondary | ICD-10-CM | POA: Insufficient documentation

## 2012-04-03 DIAGNOSIS — Z8739 Personal history of other diseases of the musculoskeletal system and connective tissue: Secondary | ICD-10-CM | POA: Insufficient documentation

## 2012-04-03 DIAGNOSIS — F411 Generalized anxiety disorder: Secondary | ICD-10-CM | POA: Insufficient documentation

## 2012-04-03 DIAGNOSIS — Z8619 Personal history of other infectious and parasitic diseases: Secondary | ICD-10-CM | POA: Insufficient documentation

## 2012-04-03 DIAGNOSIS — N390 Urinary tract infection, site not specified: Secondary | ICD-10-CM

## 2012-04-03 DIAGNOSIS — S025XXA Fracture of tooth (traumatic), initial encounter for closed fracture: Secondary | ICD-10-CM

## 2012-04-03 DIAGNOSIS — K089 Disorder of teeth and supporting structures, unspecified: Secondary | ICD-10-CM | POA: Insufficient documentation

## 2012-04-03 DIAGNOSIS — I10 Essential (primary) hypertension: Secondary | ICD-10-CM | POA: Insufficient documentation

## 2012-04-03 DIAGNOSIS — E7800 Pure hypercholesterolemia, unspecified: Secondary | ICD-10-CM | POA: Insufficient documentation

## 2012-04-03 DIAGNOSIS — Z8719 Personal history of other diseases of the digestive system: Secondary | ICD-10-CM | POA: Insufficient documentation

## 2012-04-03 LAB — URINALYSIS, ROUTINE W REFLEX MICROSCOPIC
Nitrite: POSITIVE — AB
Protein, ur: 100 mg/dL — AB
Specific Gravity, Urine: >1.03 — ABNORMAL HIGH (ref 1.005–1.030)
Urobilinogen, UA: 1 mg/dL (ref 0.0–1.0)

## 2012-04-03 MED ORDER — OXYCODONE-ACETAMINOPHEN 5-325 MG PO TABS: 1.0000 | ORAL_TABLET | Freq: Four times a day (QID) | ORAL | Status: AC | PRN

## 2012-04-03 MED ORDER — HYDROMORPHONE HCL PF 1 MG/ML IJ SOLN
1.0000 mg | Freq: Once | INTRAMUSCULAR | Status: AC
  Administered 2012-04-03: 1 mg via INTRAMUSCULAR
  Filled 2012-04-03: qty 1

## 2012-04-03 MED ORDER — CIPROFLOXACIN HCL 250 MG PO TABS
500.0000 mg | ORAL_TABLET | Freq: Once | ORAL | Status: AC
  Administered 2012-04-03: 500 mg via ORAL
  Filled 2012-04-03: qty 2

## 2012-04-03 MED ORDER — CIPROFLOXACIN HCL 500 MG PO TABS: 500.0000 mg | ORAL_TABLET | Freq: Two times a day (BID) | ORAL | Status: DC

## 2012-04-03 NOTE — ED Notes (Signed)
1. I've been "pissin blood for last 4-5 days", is followed by dr.javaid and had urine specimen sent at his office and was told no uti. No fever. +back pain, unsure of vaginal discharge.  2. Right lower toothache that she would also like checked.
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
## 2012-04-05 LAB — URINE CULTURE: Colony Count: >=100000

## 2012-04-06 NOTE — ED Notes (Signed)
+  Urine. Patient given Cipro. Resistant. Chart sent to EDP office for review. °

## 2012-04-08 NOTE — ED Notes (Signed)
Chart returned from EDP office.Rx needs to be called to pharmacy for Keflex 500 TID x 10 days QTY 30 tabs written by  Jonathon Jordan.

## 2012-04-09 NOTE — ED Notes (Signed)
rx called to Pharmacy [phone] by [facility]

## 2012-05-30 ENCOUNTER — Ambulatory Visit (INDEPENDENT_AMBULATORY_CARE_PROVIDER_SITE_OTHER): Payer: Medicaid Other | Admitting: Neurology

## 2012-05-30 ENCOUNTER — Encounter: Payer: Self-pay | Admitting: Neurology

## 2012-05-30 VITALS — BP 116/72 | HR 64 | Ht 64.25 in | Wt 144.0 lb

## 2012-05-30 DIAGNOSIS — R413 Other amnesia: Secondary | ICD-10-CM
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
## 2012-05-31 LAB — HIV ANTIBODY (ROUTINE TESTING W REFLEX): HIV 1/O/2 Abs-Index Value: <1 (ref <1.00)

## 2012-06-05 NOTE — Progress Notes (Signed)
Quick Note:  I called and left a message for the patient that her lab results were normal. ______ 

## 2012-06-13 DIAGNOSIS — R413 Other amnesia: Secondary | ICD-10-CM

## 2012-06-16 ENCOUNTER — Emergency Department (HOSPITAL_COMMUNITY): Payer: Medicaid Other

## 2012-06-16 ENCOUNTER — Encounter (HOSPITAL_COMMUNITY): Payer: Self-pay | Admitting: *Deleted

## 2012-06-16 ENCOUNTER — Emergency Department (HOSPITAL_COMMUNITY)
Admission: EM | Admit: 2012-06-16 | Discharge: 2012-06-16 | Disposition: A | Discharge status: Home / Self Care | Payer: Medicaid Other | Attending: Emergency Medicine | Admitting: Emergency Medicine

## 2012-06-16 DIAGNOSIS — K219 Gastro-esophageal reflux disease without esophagitis: Secondary | ICD-10-CM | POA: Insufficient documentation

## 2012-06-16 DIAGNOSIS — Z8719 Personal history of other diseases of the digestive system: Secondary | ICD-10-CM | POA: Insufficient documentation

## 2012-06-16 DIAGNOSIS — G43909 Migraine, unspecified, not intractable, without status migrainosus: Secondary | ICD-10-CM | POA: Insufficient documentation

## 2012-06-16 DIAGNOSIS — F319 Bipolar disorder, unspecified: Secondary | ICD-10-CM | POA: Insufficient documentation

## 2012-06-16 DIAGNOSIS — F17200 Nicotine dependence, unspecified, uncomplicated: Secondary | ICD-10-CM | POA: Insufficient documentation

## 2012-06-16 DIAGNOSIS — I10 Essential (primary) hypertension: Secondary | ICD-10-CM | POA: Insufficient documentation

## 2012-06-16 DIAGNOSIS — Z79899 Other long term (current) drug therapy: Secondary | ICD-10-CM | POA: Insufficient documentation

## 2012-06-16 DIAGNOSIS — Z8639 Personal history of other endocrine, nutritional and metabolic disease: Secondary | ICD-10-CM | POA: Insufficient documentation

## 2012-06-16 DIAGNOSIS — J40 Bronchitis, not specified as acute or chronic: Secondary | ICD-10-CM

## 2012-06-16 DIAGNOSIS — R05 Cough: Secondary | ICD-10-CM | POA: Insufficient documentation

## 2012-06-16 DIAGNOSIS — R059 Cough, unspecified: Secondary | ICD-10-CM | POA: Insufficient documentation

## 2012-06-16 DIAGNOSIS — Z862 Personal history of diseases of the blood and blood-forming organs and certain disorders involving the immune mechanism: Secondary | ICD-10-CM | POA: Insufficient documentation

## 2012-06-16 DIAGNOSIS — Z8679 Personal history of other diseases of the circulatory system: Secondary | ICD-10-CM | POA: Insufficient documentation

## 2012-06-16 DIAGNOSIS — J029 Acute pharyngitis, unspecified: Secondary | ICD-10-CM | POA: Insufficient documentation

## 2012-06-16 DIAGNOSIS — R51 Headache: Secondary | ICD-10-CM | POA: Insufficient documentation

## 2012-06-16 DIAGNOSIS — Z8619 Personal history of other infectious and parasitic diseases: Secondary | ICD-10-CM | POA: Insufficient documentation

## 2012-06-16 DIAGNOSIS — F411 Generalized anxiety disorder: Secondary | ICD-10-CM | POA: Insufficient documentation

## 2012-06-16 DIAGNOSIS — J209 Acute bronchitis, unspecified: Secondary | ICD-10-CM | POA: Insufficient documentation

## 2012-06-16 DIAGNOSIS — R062 Wheezing: Secondary | ICD-10-CM | POA: Insufficient documentation

## 2012-06-16 DIAGNOSIS — Z8739 Personal history of other diseases of the musculoskeletal system and connective tissue: Secondary | ICD-10-CM | POA: Insufficient documentation

## 2012-06-16 MED ORDER — IPRATROPIUM BROMIDE 0.02 % IN SOLN
0.5000 mg | Freq: Once | RESPIRATORY_TRACT | Status: AC
  Administered 2012-06-16: 0.5 mg via RESPIRATORY_TRACT
  Filled 2012-06-16: qty 2.5

## 2012-06-16 MED ORDER — HYDROCODONE-ACETAMINOPHEN 5-325 MG PO TABS: 1.0000 | ORAL_TABLET | Freq: Four times a day (QID) | ORAL | Status: DC | PRN

## 2012-06-16 MED ORDER — AZITHROMYCIN 250 MG PO TABS
500.0000 mg | ORAL_TABLET | Freq: Once | ORAL | Status: AC
  Administered 2012-06-16: 500 mg via ORAL
  Filled 2012-06-16: qty 2

## 2012-06-16 MED ORDER — ALBUTEROL SULFATE HFA 108 (90 BASE) MCG/ACT IN AERS
2.0000 | INHALATION_SPRAY | RESPIRATORY_TRACT | Status: DC | PRN
  Administered 2012-06-16: 2 via RESPIRATORY_TRACT
  Filled 2012-06-16: qty 6.7

## 2012-06-16 MED ORDER — AZITHROMYCIN 250 MG PO TABS: ORAL_TABLET | ORAL | Status: DC

## 2012-06-16 MED ORDER — ALBUTEROL SULFATE (5 MG/ML) 0.5% IN NEBU
5.0000 mg | INHALATION_SOLUTION | Freq: Once | RESPIRATORY_TRACT | Status: AC
  Administered 2012-06-16: 5 mg via RESPIRATORY_TRACT
  Filled 2012-06-16: qty 1

## 2012-06-16 MED ORDER — LORAZEPAM 1 MG PO TABS
1.0000 mg | ORAL_TABLET | Freq: Once | ORAL | Status: AC
Start: 2012-06-16 — End: 2012-06-16
  Administered 2012-06-16: 1 mg via ORAL
  Filled 2012-06-16: qty 1
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
## 2012-06-16 NOTE — ED Notes (Signed)
Received pt from EMS secondary to SOB, cough and sore throat. Pt transported in room air for SAO2 of 98. No respiratory distress noted per EMS.

## 2012-06-17 ENCOUNTER — Telehealth (HOSPITAL_COMMUNITY): Payer: Self-pay | Admitting: Emergency Medicine

## 2012-06-17 ENCOUNTER — Telehealth: Payer: Self-pay | Admitting: Neurology

## 2012-06-17 ENCOUNTER — Other Ambulatory Visit: Payer: Self-pay | Admitting: Neurology

## 2012-06-17 DIAGNOSIS — R413 Other amnesia: Secondary | ICD-10-CM

## 2012-06-17 NOTE — Telephone Encounter (Signed)
I called the patient. The MRI the brain shows mild atrophy and sinus disease. No evidence of cerebrovascular disease was seen. I discussed this with the patient. We will need to follow the memory issues over time to determine if there is progression or not. The patient is on a lot of medications that are psychoactive, and this may be impairing her memory.

## 2012-06-17 NOTE — ED Notes (Signed)
[facility] called to clarifiy if they needed to fill vicodin prescription. Pt had 110 of vicodin filled on 06/09/2012. Verified with Dr Effie Shy not to fill current prescription.

## 2012-06-24 ENCOUNTER — Other Ambulatory Visit: Payer: Self-pay

## 2012-06-24 ENCOUNTER — Telehealth: Payer: Self-pay | Admitting: *Deleted

## 2012-06-24 MED ORDER — FUSION PLUS PO CAPS: 1.0000 | ORAL_CAPSULE | Freq: Every morning | ORAL | Status: DC

## 2012-06-24 NOTE — Telephone Encounter (Signed)
Left message that pt would need to call office and schedule appt. Having UTI symptoms.

## 2012-07-09 ENCOUNTER — Other Ambulatory Visit: Payer: Medicaid Other | Admitting: Obstetrics & Gynecology

## 2012-07-11 ENCOUNTER — Ambulatory Visit (INDEPENDENT_AMBULATORY_CARE_PROVIDER_SITE_OTHER): Payer: Medicaid Other | Admitting: Obstetrics & Gynecology

## 2012-07-11 ENCOUNTER — Encounter: Payer: Self-pay | Admitting: Obstetrics & Gynecology

## 2012-07-11 VITALS — BP 130/80 | Ht 63.0 in | Wt 141.0 lb

## 2012-07-11 DIAGNOSIS — Z01419 Encounter for gynecological examination (general) (routine) without abnormal findings: Secondary | ICD-10-CM

## 2012-07-11 DIAGNOSIS — Z0000 Encounter for general adult medical examination without abnormal findings: Secondary | ICD-10-CM

## 2012-07-11 MED ORDER — METRONIDAZOLE 0.75 % VA GEL: 1.0000 | VAGINAL | Status: DC

## 2012-07-11 NOTE — Progress Notes (Signed)
Patient ID: [name], female   DOB: [birth year], 52 y.o.   MRN: [id number] Subjective:     [name] is a 52 y.o. female here for a routine exam.  No LMP recorded. Patient has had an ablation. No obstetric history on file. Current complaints: vaginal odor.  Personal health questionnaire reviewed: yes.   Gynecologic History No LMP recorded. Patient has had an ablation. Contraception: post menopausal status Last Pap: 2013. Results were: normal Last mammogram: 2013. Results were: normal  Obstetric History OB History   Grav Para Term Preterm Abortions TAB SAB Ect Mult Living                   The following portions of the patient's history were reviewed and updated as appropriate: allergies, current medications, past family history, past medical history, past social history, past surgical history and problem list.  Review of Systems  Review of Systems  Constitutional: Negative for fever, chills, weight loss, malaise/fatigue and diaphoresis.  HENT: Negative for hearing loss, ear pain, nosebleeds, congestion, sore throat, neck pain, tinnitus and ear discharge.   Eyes: Negative for blurred vision, double vision, photophobia, pain, discharge and redness.  Respiratory: Negative for cough, hemoptysis, sputum production, shortness of breath, wheezing and stridor.   Cardiovascular: Negative for chest pain, palpitations, orthopnea, claudication, leg swelling and PND.  Gastrointestinal: negative for abdominal pain. Negative for heartburn, nausea, vomiting, diarrhea, constipation, blood in stool and melena.  Genitourinary: Negative for dysuria, urgency, frequency, hematuria and flank pain.  Musculoskeletal: Negative for myalgias, back pain, joint pain and falls.  Skin: Negative for itching and rash.  Neurological: Negative for dizziness, tingling, tremors, sensory change, speech change, focal weakness, seizures, loss of consciousness, weakness and headaches.  Endo/Heme/Allergies: Negative for  environmental allergies and polydipsia. Does not bruise/bleed easily.  Psychiatric/Behavioral: Negative for depression, suicidal ideas, hallucinations, memory loss and substance abuse. The patient is not nervous/anxious and does not have insomnia.        Objective:    Physical Exam  Vitals reviewed. Constitutional: She is oriented to person, place, and time. She appears well-developed and well-nourished.  HENT:  Head: Normocephalic and atraumatic.        Right Ear: External ear normal.  Left Ear: External ear normal.  Nose: Nose normal.  Mouth/Throat: Oropharynx is clear and moist.  Eyes: Conjunctivae and EOM are normal. Pupils are equal, round, and reactive to light. Right eye exhibits no discharge. Left eye exhibits no discharge. No scleral icterus.  Neck: Normal range of motion. Neck supple. No tracheal deviation present. No thyromegaly present.  Cardiovascular: Normal rate, regular rhythm, normal heart sounds and intact distal pulses.  Exam reveals no gallop and no friction rub.   No murmur heard. Respiratory: Effort normal and breath sounds normal. No respiratory distress. She has no wheezes. She has no rales. She exhibits no tenderness.  GI: Soft. Bowel sounds are normal. She exhibits no distension and no mass. There is no tenderness. There is no rebound and no guarding.  Genitourinary:       Vulva is normal without lesions Vagina is pink moist without discharge Cervix normal in appearance and pap is done Uterus is normal size shape and contour Adnexa is negative with normal sized ovaries   Musculoskeletal: Normal range of motion. She exhibits no edema and no tenderness.  Neurological: She is alert and oriented to person, place, and time. She has normal reflexes. She displays normal reflexes. No cranial nerve deficit. She exhibits normal muscle tone. Coordination  normal.  Skin: Skin is warm and dry. No rash noted. No erythema. No pallor.  Psychiatric: She has a normal mood and  affect. Her behavior is normal. Judgment and thought content normal.       Assessment:    Healthy female exam.   +BV Plan:    [place] Gel x 5 days

## 2012-07-11 NOTE — Patient Instructions (Addendum)
Bacterial Vaginosis Bacterial vaginosis (BV) is a vaginal infection where the normal balance of bacteria in the vagina is disrupted. The normal balance is then replaced by an overgrowth of certain bacteria. There are several different kinds of bacteria that can cause BV. BV is the most common vaginal infection in women of childbearing age. CAUSES   The cause of BV is not fully understood. BV develops when there is an increase or imbalance of harmful bacteria.  Some activities or behaviors can upset the normal balance of bacteria in the vagina and put women at increased risk including:  Having a new sex partner or multiple sex partners.  Douching.  Using an intrauterine device (IUD) for contraception.  It is not clear what role sexual activity plays in the development of BV. However, women that have never had sexual intercourse are rarely infected with BV. Women do not get BV from toilet seats, bedding, swimming pools or from touching objects around them.  SYMPTOMS   Grey vaginal discharge.  A fish-like odor with discharge, especially after sexual intercourse.  Itching or burning of the vagina and vulva.  Burning or pain with urination.  Some women have no signs or symptoms at all. DIAGNOSIS  Your caregiver must examine the vagina for signs of BV. Your caregiver will perform lab tests and look at the sample of vaginal fluid through a microscope. They will look for bacteria and abnormal cells (clue cells), a pH test higher than 4.5, and a positive amine test all associated with BV.  RISKS AND COMPLICATIONS   Pelvic inflammatory disease (PID).  Infections following gynecology surgery.  Developing HIV.  Developing herpes virus. TREATMENT  Sometimes BV will clear up without treatment. However, all women with symptoms of BV should be treated to avoid complications, especially if gynecology surgery is planned. Female partners generally do not need to be treated. However, BV may spread  between female sex partners so treatment is helpful in preventing a recurrence of BV.   BV may be treated with antibiotics. The antibiotics come in either pill or vaginal cream forms. Either can be used with nonpregnant or pregnant women, but the recommended dosages differ. These antibiotics are not harmful to the baby.  BV can recur after treatment. If this happens, a second round of antibiotics will often be prescribed.  Treatment is important for pregnant women. If not treated, BV can cause a premature delivery, especially for a pregnant woman who had a premature birth in the past. All pregnant women who have symptoms of BV should be checked and treated.  For chronic reoccurrence of BV, treatment with a type of prescribed gel vaginally twice a week is helpful. HOME CARE INSTRUCTIONS   Finish all medication as directed by your caregiver.  Do not have sex until treatment is completed.  Tell your sexual partner that you have a vaginal infection. They should see their caregiver and be treated if they have problems, such as a mild rash or itching.  Practice safe sex. Use condoms. Only have 1 sex partner. PREVENTION  Basic prevention steps can help reduce the risk of upsetting the natural balance of bacteria in the vagina and developing BV:  Do not have sexual intercourse (be abstinent).  Do not douche.  Use all of the medicine prescribed for treatment of BV, even if the signs and symptoms go away.  Tell your sex partner if you have BV. That way, they can be treated, if needed, to prevent reoccurrence. SEEK MEDICAL CARE IF:     Your symptoms are not improving after 3 days of treatment.  You have increased discharge, pain, or fever. MAKE SURE YOU:   Understand these instructions.  Will watch your condition.  Will get help right away if you are not doing well or get worse. FOR MORE INFORMATION  Division of STD Prevention (DSTDP), Centers for Disease Control and Prevention:  www.cdc.gov/std American Social Health Association (ASHA): www.ashastd.org  Document Released: 02/13/2005 Document Revised: 05/08/2011 Document Reviewed: 08/06/2008 Exit[employer] Patient Information 2013 [employer].  

## 2012-07-18 ENCOUNTER — Other Ambulatory Visit: Payer: Self-pay

## 2012-07-18 ENCOUNTER — Telehealth: Payer: Self-pay | Admitting: Obstetrics & Gynecology

## 2012-07-18 MED ORDER — LUBIPROSTONE 24 MCG PO CAPS: 24.0000 ug | ORAL_CAPSULE | Freq: Two times a day (BID) | ORAL | Status: DC

## 2012-07-18 NOTE — Telephone Encounter (Signed)
Spoke with pt. Was diagnosed with bacterial vaginosis and was put on a vaginal cream. Yesterday, noticed some blood with wiping but none since then. Thinks she now has a yeast infection. Can you call in Diflucan to [facility]? Also, should she continue the vaginal cream along with the Diflucan? Please advise. Thanks!

## 2012-07-23 MED ORDER — FLUCONAZOLE 150 MG PO TABS: 150.0000 mg | ORAL_TABLET | Freq: Once | ORAL | Status: DC

## 2012-07-23 NOTE — Telephone Encounter (Signed)
Diflucan eprescribed.

## 2012-07-23 NOTE — Telephone Encounter (Signed)
Diflucan e prescribed to [facility]. Left message on pt answering machine letting her know this. JSY

## 2012-07-26 ENCOUNTER — Encounter: Payer: Self-pay | Admitting: *Deleted

## 2012-07-29 ENCOUNTER — Ambulatory Visit (INDEPENDENT_AMBULATORY_CARE_PROVIDER_SITE_OTHER): Payer: Medicaid Other | Admitting: Obstetrics & Gynecology

## 2012-07-29 ENCOUNTER — Encounter: Payer: Self-pay | Admitting: Obstetrics & Gynecology

## 2012-07-29 VITALS — BP 122/80 | Wt 147.0 lb

## 2012-07-29 DIAGNOSIS — N951 Menopausal and female climacteric states: Secondary | ICD-10-CM

## 2012-07-29 DIAGNOSIS — N3941 Urge incontinence: Secondary | ICD-10-CM

## 2012-07-29 DIAGNOSIS — N760 Acute vaginitis: Secondary | ICD-10-CM

## 2012-07-29 MED ORDER — ESTRADIOL 2 MG PO TABS: 2.0000 mg | ORAL_TABLET | Freq: Every day | ORAL | Status: DC

## 2012-07-29 MED ORDER — SOLIFENACIN SUCCINATE 10 MG PO TABS: ORAL_TABLET | ORAL | Status: DC

## 2012-07-29 MED ORDER — MEDROXYPROGESTERONE ACETATE 2.5 MG PO TABS: 2.5000 mg | ORAL_TABLET | Freq: Every day | ORAL | Status: DC

## 2012-07-29 NOTE — Progress Notes (Signed)
Patient ID: [name], female   DOB: [birth year], 52 y.o.   MRN: [id number] Patient here to check for BV, status post metrogel No complaints of that  Exam  Normal exam  Also having lots of hot flashes, wants to try some HRT  Estradiol 2 mg daily Provera 2.5 mg daily  Follow up 1 month  Also start vesicare 10 at hs  Face to face 20 minutes

## 2012-08-19 ENCOUNTER — Other Ambulatory Visit: Payer: Self-pay | Admitting: Gastroenterology

## 2012-08-19 ENCOUNTER — Other Ambulatory Visit: Payer: Self-pay

## 2012-08-20 ENCOUNTER — Other Ambulatory Visit: Payer: Self-pay

## 2012-08-20 DIAGNOSIS — B182 Chronic viral hepatitis C: Secondary | ICD-10-CM

## 2012-08-22 ENCOUNTER — Telehealth: Payer: Self-pay

## 2012-08-22 MED ORDER — HYDROCORTISONE ACETATE 25 MG RE SUPP: 25.0000 mg | Freq: Two times a day (BID) | RECTAL | Status: DC

## 2012-08-22 NOTE — Telephone Encounter (Signed)
Pt called requesting an rx for suppositories called in for her hemorrhoids. She said they are painful and bleeding. Pt uses [employer].

## 2012-08-22 NOTE — Telephone Encounter (Signed)
Completed.  Make sure she is avoiding constipation, following a high fiber diet. No straining.

## 2012-08-28 ENCOUNTER — Ambulatory Visit: Payer: Medicaid Other | Admitting: Obstetrics & Gynecology

## 2012-09-09 ENCOUNTER — Ambulatory Visit: Payer: Medicaid Other | Admitting: Obstetrics & Gynecology

## 2012-09-10 ENCOUNTER — Ambulatory Visit: Payer: Medicaid Other | Admitting: Obstetrics & Gynecology

## 2012-09-17 ENCOUNTER — Telehealth: Payer: Self-pay | Admitting: Internal Medicine

## 2012-09-17 NOTE — Telephone Encounter (Signed)
Pt called today. She received a letter from [place] that she was due to have blood work done, but she doesn't have the money to pay for transportation to take her to the lab. She said she would have to wait until next month (August) when she gets another check. Please contact her in another month to remind her.

## 2012-09-25 NOTE — Telephone Encounter (Signed)
Reminder done and will be mailed to pt.

## 2012-11-04 ENCOUNTER — Other Ambulatory Visit: Payer: Self-pay | Admitting: Gastroenterology

## 2012-11-05 LAB — AFP TUMOR MARKER: AFP-Tumor Marker: <1.3 ng/mL (ref 0.0–8.0)

## 2012-11-13 ENCOUNTER — Other Ambulatory Visit (HOSPITAL_COMMUNITY): Payer: Self-pay | Admitting: Pulmonary Disease

## 2012-11-13 DIAGNOSIS — Z139 Encounter for screening, unspecified: Secondary | ICD-10-CM

## 2012-11-18 NOTE — Progress Notes (Signed)
Quick Note:  Normal AFP. No need to continue checking. Needs routine f/u with [place]. Non-urgent. ______

## 2012-11-19 ENCOUNTER — Encounter: Payer: Self-pay | Admitting: Internal Medicine

## 2012-12-04 ENCOUNTER — Other Ambulatory Visit: Payer: Self-pay

## 2012-12-04 MED ORDER — OMEPRAZOLE 20 MG PO CPDR: 20.0000 mg | DELAYED_RELEASE_CAPSULE | Freq: Every morning | ORAL | Status: DC

## 2012-12-05 ENCOUNTER — Ambulatory Visit: Payer: Medicaid Other | Admitting: Neurology

## 2012-12-12 ENCOUNTER — Encounter: Payer: Self-pay | Admitting: Gastroenterology

## 2012-12-12 ENCOUNTER — Ambulatory Visit (HOSPITAL_COMMUNITY)
Admission: RE | Admit: 2012-12-12 | Discharge: 2012-12-12 | Disposition: A | Discharge status: Home / Self Care | Payer: Medicaid Other | Source: Ambulatory Visit | Attending: Pulmonary Disease | Admitting: Pulmonary Disease

## 2012-12-12 ENCOUNTER — Ambulatory Visit (INDEPENDENT_AMBULATORY_CARE_PROVIDER_SITE_OTHER): Payer: Medicaid Other | Admitting: Gastroenterology

## 2012-12-12 VITALS — BP 134/87 | HR 113 | Temp 98.4°F | Ht 61.0 in | Wt 146.2 lb

## 2012-12-12 DIAGNOSIS — Z139 Encounter for screening, unspecified: Secondary | ICD-10-CM

## 2012-12-12 DIAGNOSIS — Z1231 Encounter for screening mammogram for malignant neoplasm of breast: Secondary | ICD-10-CM | POA: Insufficient documentation

## 2012-12-12 DIAGNOSIS — B182 Chronic viral hepatitis C: Secondary | ICD-10-CM

## 2012-12-12 MED ORDER — HYDROCORTISONE ACETATE 25 MG RE SUPP: 25.0000 mg | Freq: Two times a day (BID) | RECTAL | Status: DC

## 2012-12-12 NOTE — Patient Instructions (Signed)
I sent the suppositories for your hemorrhoids to the pharmacy. Use twice a day for 5 days.   Please have blood work done; we will call with results.  We will schedule you for hemorrhoid banding in the next few weeks.  We have referred you back to the clinic to help treat Hepatitis C.
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
## 2012-12-13 ENCOUNTER — Encounter: Payer: Self-pay | Admitting: Gastroenterology

## 2012-12-13 ENCOUNTER — Other Ambulatory Visit: Payer: Self-pay | Admitting: Gastroenterology

## 2012-12-13 NOTE — Progress Notes (Signed)
Referral has been made to HEP C CLINIC in [place]

## 2012-12-13 NOTE — Assessment & Plan Note (Signed)
Refer back to Hep C clinic for consideration of treatment. Check HFP now, update [place] of abdomen. Anusol suppositories provided due to low-volume hematochezia. Return in near future for outpatient hemorrhoid banding. Colonoscopy up to date as of 2013.

## 2012-12-16 NOTE — Progress Notes (Signed)
cc'd to pcp 

## 2012-12-17 ENCOUNTER — Ambulatory Visit (INDEPENDENT_AMBULATORY_CARE_PROVIDER_SITE_OTHER): Payer: Medicaid Other | Admitting: Obstetrics & Gynecology

## 2012-12-17 ENCOUNTER — Other Ambulatory Visit: Payer: Self-pay | Admitting: Gastroenterology

## 2012-12-17 ENCOUNTER — Encounter: Payer: Self-pay | Admitting: Obstetrics & Gynecology

## 2012-12-17 VITALS — BP 144/80 | Ht 61.0 in | Wt 147.0 lb

## 2012-12-17 DIAGNOSIS — B373 Candidiasis of vulva and vagina: Secondary | ICD-10-CM

## 2012-12-17 MED ORDER — TERCONAZOLE 0.4 % VA CREA: 1.0000 | TOPICAL_CREAM | Freq: Every day | VAGINAL | Status: DC

## 2012-12-17 MED ORDER — FUSION PLUS PO CAPS: 1.0000 | ORAL_CAPSULE | Freq: Every morning | ORAL | Status: DC

## 2012-12-17 MED ORDER — SOLIFENACIN SUCCINATE 10 MG PO TABS: ORAL_TABLET | ORAL | Status: DC

## 2012-12-17 NOTE — Progress Notes (Signed)
Patient ID: [name], female   DOB: [birth year], 52 y.o.   MRN: [id number] Patient comes in with complaint of a vaginal discharge She was recently placed on antibiotics ciprofloxacin for a urinary tract infection  On exam she does have a yeast present in the vagina grossly Additionally the patient's has good estrogenic vaginal effect She is -- in and progesterone in not having any bleeding  We will treat her with Terazol 7 for 7 nights Continue the estrogen and progesterone treatment  She is "somewhat"improved as far as her GEN continence goes and urge symptoms As a result bump up her Vesicare to 20 mg nightly All follow [name] in 3 months for further evaluation

## 2012-12-19 ENCOUNTER — Ambulatory Visit (HOSPITAL_COMMUNITY)
Admission: RE | Admit: 2012-12-19 | Discharge: 2012-12-19 | Disposition: A | Discharge status: Home / Self Care | Payer: Medicaid Other | Source: Ambulatory Visit | Attending: Gastroenterology | Admitting: Gastroenterology

## 2012-12-19 DIAGNOSIS — B182 Chronic viral hepatitis C: Secondary | ICD-10-CM

## 2012-12-19 DIAGNOSIS — B1920 Unspecified viral hepatitis C without hepatic coma: Secondary | ICD-10-CM | POA: Insufficient documentation

## 2012-12-19 LAB — HEPATIC FUNCTION PANEL
AST: 20 U/L (ref 0–37)
Alkaline Phosphatase: 65 U/L (ref 39–117)
Bilirubin, Direct: 0.1 mg/dL (ref 0.0–0.3)
Total Bilirubin: 0.4 mg/dL (ref 0.3–1.2)

## 2012-12-25 ENCOUNTER — Telehealth: Payer: Self-pay | Admitting: *Deleted

## 2012-12-25 NOTE — Telephone Encounter (Signed)
Pt called stating [facility] is her pharmacy and pt can't afford the medication, pt would like to see if Tobi Bastos could call in something else pt is on red iron pills infusion plus. Please advise [phone] pt said to [facility] if she is not home.

## 2012-12-25 NOTE — Telephone Encounter (Signed)
Routing to AS 

## 2012-12-25 NOTE — Telephone Encounter (Signed)
What medication are we talking about?

## 2012-12-26 NOTE — Telephone Encounter (Signed)
Hydrocortisone (ANUSOL-HC) 25 MG suppository

## 2012-12-28 HISTORY — PX: MOUTH SURGERY: SHX715

## 2012-12-30 ENCOUNTER — Encounter (HOSPITAL_COMMUNITY)
Admission: RE | Admit: 2012-12-30 | Discharge: 2012-12-30 | Disposition: A | Discharge status: Home / Self Care | Payer: Medicaid Other | Source: Ambulatory Visit | Attending: Oral Surgery | Admitting: Oral Surgery

## 2012-12-30 ENCOUNTER — Encounter (HOSPITAL_COMMUNITY): Payer: Self-pay

## 2012-12-30 DIAGNOSIS — Z01818 Encounter for other preprocedural examination: Secondary | ICD-10-CM | POA: Insufficient documentation

## 2012-12-30 DIAGNOSIS — Z01812 Encounter for preprocedural laboratory examination: Secondary | ICD-10-CM | POA: Insufficient documentation

## 2012-12-30 LAB — CBC
HCT: 39.8 % (ref 36.0–46.0)
MCH: 31.4 pg (ref 26.0–34.0)
MCHC: 35.7 g/dL (ref 30.0–36.0)
Platelets: 232 10*3/uL (ref 150–400)
RDW: 13 % (ref 11.5–15.5)
WBC: 6.9 10*3/uL (ref 4.0–10.5)

## 2012-12-30 LAB — COMPREHENSIVE METABOLIC PANEL
ALT: 13 U/L (ref 0–35)
AST: 20 U/L (ref 0–37)
Alkaline Phosphatase: 72 U/L (ref 39–117)
BUN: 8 mg/dL (ref 6–23)
CO2: 27 mEq/L (ref 19–32)
GFR calc Af Amer: >90 mL/min (ref >90)
GFR calc non Af Amer: >90 mL/min (ref >90)
Glucose, Bld: 88 mg/dL (ref 70–99)
Potassium: 3.7 mEq/L (ref 3.5–5.1)
Sodium: 139 mEq/L (ref 135–145)
Total Protein: 6.9 g/dL (ref 6.0–8.3)

## 2012-12-30 MED ORDER — HYDROCORTISONE 2.5 % RE CREA: TOPICAL_CREAM | Freq: Two times a day (BID) | RECTAL | Status: DC

## 2012-12-30 NOTE — Pre-Procedure Instructions (Signed)
[name]  12/30/2012   Your procedure is scheduled on:  November 10  Report to [facility] Entrance "A" [address] at 10:00 AM.  Call this number if you have problems the morning of surgery: [phone]   Remember:   Do not eat food or drink liquids after midnight.   Take these medicines the morning of surgery with A SIP OF WATER: Xanax, Adderall, Xanax, Norvasc, Vesicare, Omeprazole, Hydrocodone (if needed)   Do not wear jewelry, make-up or nail polish.  Do not wear lotions, powders, or perfumes. You may wear deodorant.  Do not shave 48 hours prior to surgery. Men may shave face and neck.  Do not bring valuables to the hospital.  [facility] is not responsible                  for any belongings or valuables.               Contacts, dentures or bridgework may not be worn into surgery.  Leave suitcase in the car. After surgery it may be brought to your room.  For patients admitted to the hospital, discharge time is determined by your                treatment team.               Patients discharged the day of surgery will not be allowed to drive  home.    Special Instructions: Shower using CHG 2 nights before surgery and the night before surgery.  If you shower the day of surgery use CHG.  Use special wash - you have one bottle of CHG for all showers.  You should use approximately 1/3 of the bottle for each shower.   Please read over the following fact sheets that you were given: Pain Booklet, Coughing and Deep Breathing and Surgical Site Infection Prevention

## 2012-12-30 NOTE — Telephone Encounter (Signed)
I sent in Anusol cream now. That should be covered.

## 2012-12-30 NOTE — Addendum Note (Signed)
Addended by: Nira Retort on: 12/30/2012 04:31 PM   Modules accepted: Orders

## 2013-01-05 MED ORDER — CLINDAMYCIN PHOSPHATE 600 MG/50ML IV SOLN
600.0000 mg | Freq: Four times a day (QID) | INTRAVENOUS | Status: DC
  Administered 2013-01-06: 600 mg via INTRAVENOUS
  Filled 2013-01-05: qty 50

## 2013-01-05 NOTE — Progress Notes (Signed)
Quick Note:  LFTs normal.  Should be set up for outpatient banding soon, but I don't see it in epic? ______

## 2013-01-06 ENCOUNTER — Encounter (HOSPITAL_COMMUNITY): Payer: Self-pay | Admitting: Certified Registered"

## 2013-01-06 ENCOUNTER — Ambulatory Visit (HOSPITAL_COMMUNITY): Payer: Medicaid Other | Admitting: Certified Registered"

## 2013-01-06 ENCOUNTER — Ambulatory Visit (HOSPITAL_COMMUNITY)
Admission: RE | Admit: 2013-01-06 | Discharge: 2013-01-06 | Disposition: A | Discharge status: Home / Self Care | Payer: Medicaid Other | Source: Ambulatory Visit | Attending: Oral Surgery | Admitting: Oral Surgery

## 2013-01-06 ENCOUNTER — Encounter (HOSPITAL_COMMUNITY)
Admission: RE | Disposition: A | Discharge status: Home / Self Care | Payer: Self-pay | Source: Ambulatory Visit | Attending: Oral Surgery

## 2013-01-06 ENCOUNTER — Encounter (HOSPITAL_COMMUNITY): Payer: Medicaid Other | Admitting: Certified Registered"

## 2013-01-06 DIAGNOSIS — B370 Candidal stomatitis: Secondary | ICD-10-CM | POA: Insufficient documentation

## 2013-01-06 DIAGNOSIS — I10 Essential (primary) hypertension: Secondary | ICD-10-CM

## 2013-01-06 DIAGNOSIS — K5900 Constipation, unspecified: Secondary | ICD-10-CM

## 2013-01-06 DIAGNOSIS — K0530 Chronic periodontitis, unspecified: Secondary | ICD-10-CM

## 2013-01-06 DIAGNOSIS — K219 Gastro-esophageal reflux disease without esophagitis: Secondary | ICD-10-CM

## 2013-01-06 DIAGNOSIS — N912 Amenorrhea, unspecified: Secondary | ICD-10-CM

## 2013-01-06 DIAGNOSIS — K921 Melena: Secondary | ICD-10-CM

## 2013-01-06 DIAGNOSIS — R413 Other amnesia: Secondary | ICD-10-CM

## 2013-01-06 DIAGNOSIS — Z880 Allergy status to penicillin: Secondary | ICD-10-CM | POA: Insufficient documentation

## 2013-01-06 DIAGNOSIS — D509 Iron deficiency anemia, unspecified: Secondary | ICD-10-CM

## 2013-01-06 DIAGNOSIS — R0602 Shortness of breath: Secondary | ICD-10-CM

## 2013-01-06 DIAGNOSIS — E7800 Pure hypercholesterolemia, unspecified: Secondary | ICD-10-CM | POA: Insufficient documentation

## 2013-01-06 DIAGNOSIS — K029 Dental caries, unspecified: Secondary | ICD-10-CM

## 2013-01-06 DIAGNOSIS — R9389 Abnormal findings on diagnostic imaging of other specified body structures: Secondary | ICD-10-CM

## 2013-01-06 DIAGNOSIS — F411 Generalized anxiety disorder: Secondary | ICD-10-CM | POA: Insufficient documentation

## 2013-01-06 DIAGNOSIS — B1920 Unspecified viral hepatitis C without hepatic coma: Secondary | ICD-10-CM

## 2013-01-06 DIAGNOSIS — F319 Bipolar disorder, unspecified: Secondary | ICD-10-CM | POA: Insufficient documentation

## 2013-01-06 DIAGNOSIS — K089 Disorder of teeth and supporting structures, unspecified: Secondary | ICD-10-CM | POA: Insufficient documentation

## 2013-01-06 DIAGNOSIS — R7611 Nonspecific reaction to tuberculin skin test without active tuberculosis: Secondary | ICD-10-CM | POA: Insufficient documentation

## 2013-01-06 DIAGNOSIS — B182 Chronic viral hepatitis C: Secondary | ICD-10-CM

## 2013-01-06 HISTORY — PX: MULTIPLE EXTRACTIONS WITH ALVEOLOPLASTY: SHX5342

## 2013-01-06 SURGERY — MULTIPLE EXTRACTION WITH ALVEOLOPLASTY
Anesthesia: Choice | Site: Mouth | Wound class: Clean Contaminated

## 2013-01-06 MED ORDER — OXYCODONE HCL 5 MG/5ML PO SOLN
15.0000 mg | ORAL | Status: DC | PRN
  Administered 2013-01-06: 15 mg via ORAL

## 2013-01-06 MED ORDER — SODIUM CHLORIDE 0.9 % IR SOLN
Status: DC | PRN
  Administered 2013-01-06: 1000 mL

## 2013-01-06 MED ORDER — OXYMETAZOLINE HCL 0.05 % NA SOLN
NASAL | Status: AC
  Filled 2013-01-06: qty 15

## 2013-01-06 MED ORDER — OXYCODONE HCL 5 MG/5ML PO SOLN
ORAL | Status: AC
  Filled 2013-01-06: qty 5

## 2013-01-06 MED ORDER — MIDAZOLAM HCL 5 MG/5ML IJ SOLN
INTRAMUSCULAR | Status: DC | PRN
  Administered 2013-01-06: 2 mg via INTRAVENOUS

## 2013-01-06 MED ORDER — LACTATED RINGERS IV SOLN
INTRAVENOUS | Status: DC | PRN
  Administered 2013-01-06: 12:00:00 via INTRAVENOUS

## 2013-01-06 MED ORDER — HYDROMORPHONE HCL PF 1 MG/ML IJ SOLN: 0.2500 mg | INTRAMUSCULAR | Status: DC | PRN

## 2013-01-06 MED ORDER — PROPOFOL 10 MG/ML IV BOLUS
INTRAVENOUS | Status: DC | PRN
  Administered 2013-01-06: 200 mg via INTRAVENOUS

## 2013-01-06 MED ORDER — METOPROLOL TARTRATE 1 MG/ML IV SOLN
INTRAVENOUS | Status: DC | PRN
  Administered 2013-01-06: 2.5 mg via INTRAVENOUS

## 2013-01-06 MED ORDER — SUCCINYLCHOLINE CHLORIDE 20 MG/ML IJ SOLN
INTRAMUSCULAR | Status: DC | PRN
  Administered 2013-01-06: 100 mg via INTRAVENOUS

## 2013-01-06 MED ORDER — FENTANYL CITRATE 0.05 MG/ML IJ SOLN
INTRAMUSCULAR | Status: DC | PRN
  Administered 2013-01-06 (×2): 50 ug via INTRAVENOUS
  Administered 2013-01-06: 150 ug via INTRAVENOUS

## 2013-01-06 MED ORDER — HYDROMORPHONE HCL PF 1 MG/ML IJ SOLN
INTRAMUSCULAR | Status: AC
  Filled 2013-01-06: qty 1

## 2013-01-06 MED ORDER — LIDOCAINE-EPINEPHRINE 2 %-1:100000 IJ SOLN
INTRAMUSCULAR | Status: AC
  Filled 2013-01-06: qty 1

## 2013-01-06 MED ORDER — FLUCONAZOLE 150 MG PO TABS: 150.0000 mg | ORAL_TABLET | Freq: Once | ORAL | Status: DC

## 2013-01-06 MED ORDER — OXYCODONE-ACETAMINOPHEN 10-325 MG PO TABS: 1.0000 | ORAL_TABLET | ORAL | Status: DC | PRN

## 2013-01-06 MED ORDER — LIDOCAINE HCL (CARDIAC) 20 MG/ML IV SOLN
INTRAVENOUS | Status: DC | PRN
  Administered 2013-01-06: 80 mg via INTRAVENOUS

## 2013-01-06 MED ORDER — LACTATED RINGERS IV SOLN
INTRAVENOUS | Status: DC
  Administered 2013-01-06: 11:00:00 via INTRAVENOUS

## 2013-01-06 MED ORDER — OXYCODONE HCL 5 MG/5ML PO SOLN
5.0000 mg | Freq: Once | ORAL | Status: AC
  Administered 2013-01-06: 5 mg via ORAL

## 2013-01-06 MED ORDER — LIDOCAINE-EPINEPHRINE 2 %-1:100000 IJ SOLN
INTRAMUSCULAR | Status: DC | PRN
  Administered 2013-01-06: 20 mL

## 2013-01-06 MED ORDER — ONDANSETRON HCL 4 MG/2ML IJ SOLN
INTRAMUSCULAR | Status: DC | PRN
  Administered 2013-01-06: 4 mg via INTRAVENOUS

## 2013-01-06 MED ORDER — OXYCODONE HCL 5 MG/5ML PO SOLN
ORAL | Status: AC
  Filled 2013-01-06: qty 15

## 2013-01-06 SURGICAL SUPPLY — 27 items
BUR CROSS CUT FISSURE 1.6 (BURR) ×2 IMPLANT
BUR EGG ELITE 4.0 (BURR) IMPLANT
CANISTER SUCTION 2500CC (MISCELLANEOUS) ×2 IMPLANT
CLOTH BEACON ORANGE TIMEOUT ST (SAFETY) IMPLANT
COVER SURGICAL LIGHT HANDLE (MISCELLANEOUS) ×2 IMPLANT
CRADLE DONUT ADULT HEAD (MISCELLANEOUS) ×2 IMPLANT
DECANTER SPIKE VIAL GLASS SM (MISCELLANEOUS) ×2 IMPLANT
GAUZE PACKING FOLDED 2  STR (GAUZE/BANDAGES/DRESSINGS) ×1
GAUZE PACKING FOLDED 2 STR (GAUZE/BANDAGES/DRESSINGS) ×1 IMPLANT
GLOVE BIO SURGEON STRL SZ 6.5 (GLOVE) ×2 IMPLANT
GLOVE BIO SURGEON STRL SZ7.5 (GLOVE) ×2 IMPLANT
GLOVE BIOGEL PI IND STRL 7.0 (GLOVE) ×1 IMPLANT
GLOVE BIOGEL PI INDICATOR 7.0 (GLOVE) ×1
GOWN STRL NON-REIN LRG LVL3 (GOWN DISPOSABLE) ×2 IMPLANT
GOWN STRL REIN XL XLG (GOWN DISPOSABLE) ×2 IMPLANT
KIT BASIN OR (CUSTOM PROCEDURE TRAY) ×2 IMPLANT
KIT ROOM TURNOVER OR (KITS) ×2 IMPLANT
NEEDLE 22X1 1/2 (OR ONLY) (NEEDLE) ×2 IMPLANT
NS IRRIG 1000ML POUR BTL (IV SOLUTION) ×2 IMPLANT
PAD ARMBOARD 7.5X6 YLW CONV (MISCELLANEOUS) ×2 IMPLANT
SUT CHROMIC 3 0 PS 2 (SUTURE) ×4 IMPLANT
SYR CONTROL 10ML LL (SYRINGE) ×2 IMPLANT
TOWEL OR 17X26 10 PK STRL BLUE (TOWEL DISPOSABLE) ×2 IMPLANT
TRAY ENT MC OR (CUSTOM PROCEDURE TRAY) ×2 IMPLANT
TUBING IRRIGATION (MISCELLANEOUS) ×2 IMPLANT
WATER STERILE IRR 1000ML POUR (IV SOLUTION) IMPLANT
YANKAUER SUCT BULB TIP NO VENT (SUCTIONS) ×2 IMPLANT

## 2013-01-06 NOTE — Anesthesia Preprocedure Evaluation (Addendum)
Anesthesia Evaluation  Patient identified by MRN, date of birth, ID band Patient awake    Reviewed: Allergy & Precautions, H&P , NPO status , Patient's Chart, lab work & pertinent test results, reviewed documented beta blocker date and time   Airway Mallampati: II TM Distance: >3 FB     Dental  (+) Loose, Missing, Poor Dentition and Dental Advisory Given   Pulmonary shortness of breath and with exertion, Current Smoker,  breath sounds clear to auscultation        Cardiovascular hypertension, Pt. on medications Rhythm:Regular     Neuro/Psych  Headaches, Anxiety Depression  Neuromuscular disease CVA, No Residual Symptoms    GI/Hepatic hiatal hernia, GERD-  Medicated and Controlled,(+)     substance abuse  cocaine use, Hepatitis -, C  Endo/Other    Renal/GU      Musculoskeletal   Abdominal (+)  Abdomen: soft. Bowel sounds: normal.  Peds  Hematology   Anesthesia Other Findings H/O positive ppd, treatment received in 2012, no s/s of tb.    Reproductive/Obstetrics                      Anesthesia Physical Anesthesia Plan  ASA: II  Anesthesia Plan:    Post-op Pain Management:    Induction:   Airway Management Planned: Nasal ETT  Additional Equipment:   Intra-op Plan:   Post-operative Plan:   Informed Consent:   Plan Discussed with:   Anesthesia Plan Comments:        Anesthesia Quick Evaluation

## 2013-01-06 NOTE — Anesthesia Procedure Notes (Signed)
Procedure Name: Intubation Date/Time: 01/06/2013 1:05 PM Performed by: Ellin Goodie Pre-anesthesia Checklist: Patient identified, Emergency Drugs available, Suction available, Patient being monitored and Timeout performed Patient Re-evaluated:Patient Re-evaluated prior to inductionOxygen Delivery Method: Circle system utilized Preoxygenation: Pre-oxygenation with 100% oxygen Intubation Type: IV induction and Rapid sequence Ventilation: Mask ventilation without difficulty Laryngoscope Size: Mac and 3 Grade View: Grade I Tube type: Oral Tube size: 7.5 mm Number of attempts: 1 Airway Equipment and Method: Stylet Placement Confirmation: ETT inserted through vocal cords under direct vision,  positive ETCO2 and breath sounds checked- equal and bilateral Secured at: 22 cm Tube secured with: Tape Dental Injury: Teeth and Oropharynx as per pre-operative assessment

## 2013-01-06 NOTE — Anesthesia Postprocedure Evaluation (Signed)
  Anesthesia Post-op Note  Patient: [name]  Procedure(s) Performed: Procedure(s): MULTIPLE EXTRACION WITH ALVEOLOPLASTY (N/A)  Patient Location: PACU  Anesthesia Type:General  Level of Consciousness: awake  Airway and Oxygen Therapy: Patient Spontanous Breathing  Post-op Pain: mild  Post-op Assessment: Post-op Vital signs reviewed  Post-op Vital Signs: Reviewed  Complications: No apparent anesthesia complications

## 2013-01-06 NOTE — Progress Notes (Signed)
Pt has appointment for banding on Dec.2 at 4:15 with RMR

## 2013-01-06 NOTE — Transfer of Care (Signed)
Immediate Anesthesia Transfer of Care Note  Patient: [name]  Procedure(s) Performed: Procedure(s): MULTIPLE EXTRACION WITH ALVEOLOPLASTY (N/A)  Patient Location: PACU  Anesthesia Type:General  Level of Consciousness: awake, alert  and oriented  Airway & Oxygen Therapy: Patient connected to face mask  Post-op Assessment: Report given to PACU RN  Post vital signs: stable  Complications: No apparent anesthesia complications

## 2013-01-06 NOTE — Preoperative (Signed)
Beta Blockers   Reason not to administer Beta Blockers:Not Applicable 

## 2013-01-06 NOTE — H&P (Signed)
HISTORY AND PHYSICAL  Avanti Jetter is a 52 y.o. female patient with CC: Dental pain. HPI: Long-standing dental pain secondary to dental caries. Patient rubbed cocaine on gums 12/30/12 because pain was so severe. Patient went to ER 01/05/2013 and was diagnosed with thrush.   No diagnosis found.  Past Medical History  Diagnosis Date  . Hypercholesterolemia   . GERD (gastroesophageal reflux disease)   . Positive PPD     Treated with INH - did not tolerate  . S/P colonoscopy June 2009    Dr. Jena Gauss: anal canal hemorrhoids  . Migraines   . Constipation   . Hemorrhoids   . S/P endoscopy Aug 2012    Few tiny distal esophageal erosions, small hiatal hernia,  . Shortness of breath 08/28/2011  . Anxiety   . Bipolar disorder personality disorder  . Hepatitis C 2012    Followed by Dr. Jacqualine Mau, immune to Hepatitis A and B  . Hepatitis C, chronic     stage 2 fibrosis on liver biopsy 04/2011.  . Arthritis   . H/O hiatal hernia   . Memory deficit 05/30/2012  . Polysubstance abuse     History of, not active  . Sciatica of right side   . Essential hypertension, benign     does not see a cardiologist     Current Facility-Administered Medications  Medication Dose Route Frequency Provider Last Rate Last Dose  . clindamycin (CLEOCIN) IVPB 600 mg  600 mg Intravenous Q6H Georgia Lopes, DDS       Allergies  Allergen Reactions  . Penicillins Anaphylaxis   Active Problems:   * No active hospital problems. *  Vitals: There were no vitals taken for this visit. Lab results:No results found for this or any previous visit (from the past 24 hour(s)). Radiology Results: No results found. General appearance: alert and cooperative Head: Normocephalic, without obvious abnormality, atraumatic Eyes: negative Nose: Nares normal. Septum midline. Mucosa normal. No drainage or sinus tenderness. Throat: Dental caries # 2, 3, 6, 7, 8, 9, 10, 12, 13, 14, 19, 23, 24, 25, 26, Neck: no adenopathy, supple, symmetrical,  trachea midline and thyroid not enlarged, symmetric, no tenderness/mass/nodules Resp: clear to auscultation bilaterally Cardio: regular rate and rhythm, S1, S2 normal, no murmur, click, rub or gallop  Assessment:52 WF with non-restorable teeth # 2, 3, 6, 7, 8, 9, 10, 12, 13, 14, 19, 23, 24, 25, 26,  Plan: Dental extractions,  Alveoloplasty. General anesthesia. Day surgery.   Lennox Leikam M 01/06/2013

## 2013-01-06 NOTE — Op Note (Signed)
01/06/2013  1:49 PM  PATIENT:  [name]  52 y.o. female  PRE-OPERATIVE DIAGNOSIS:  NON-RESTORABLE TEETH # 2, 3, 6, 7, 8, 9, 10, 12, 13, 14, 19, 23, 24, 25, 26   POST-OPERATIVE DIAGNOSIS:  SAME  PROCEDURE:  Procedure(s): MULTIPLE EXTRACTION TEETH # 2, 3, 6, 7, 8, 9, 10, 12, 13, 14, 19, 23, 24, 25, 26 WITH ALVEOLOPLASTY  SURGEON:  Surgeon(s): Georgia Lopes, DDS  ANESTHESIA:   local and general  EBL:  minimal  DRAINS: none   SPECIMEN:  No Specimen  COUNTS:  YES  PLAN OF CARE: Discharge to home after PACU  PATIENT DISPOSITION:  PACU - hemodynamically stable.   PROCEDURE DETAILS: Dictation #[id number]  Georgia Lopes, DMD 01/06/2013 1:49 PM

## 2013-01-07 NOTE — Op Note (Signed)
[name], [name]                 ACCOUNT NO.:  [id number]  MEDICAL RECORD NO.:  [id number]  LOCATION:  [facility]                         FACILITY:  [facility]  PHYSICIAN:  Georgia Lopes, M.D.  DATE OF BIRTH:  [birth year]  DATE OF PROCEDURE: DATE OF DISCHARGE:  01/06/2013                              OPERATIVE REPORT   PREOPERATIVE DIAGNOSIS:  Nonrestorable teeth numbers 2, 3, 6, 7, 8, 9, 10, 12, 13, 14, 19, 23, 24, 25, 26.  POSTOPERATIVE DIAGNOSIS:  Nonrestorable teeth numbers 2, 3, 6, 7, 8, 9, 10, 12, 13, 14, 19, 23, 24, 25, 26.  PROCEDURE:  Extraction of teeth numbers 2, 3, 6, 7, 8, 9, 10, 12, 13, 14, 19, 23, 24, 25, 26, alveoplasty of right and left maxilla and anterior mandible.  SURGEON:  Georgia Lopes, M.D.  ANESTHESIA:  General, oral intubation, Dr. Randa Evens attending.  PROCEDURE IN DETAIL:  The patient was taken to the operating room, placed on the table in supine position.  General anesthesia was administered intravenously and an oral endotracheal tube was placed and marked.  The patient was draped for the procedure and time-out was performed.  The posterior pharynx was suctioned, and a throat pack was placed.  A 2% lidocaine with 1:100,000 epinephrine was infiltrated in an inferior alveolar block on the left side and in buccal and palatal infiltration of the maxilla, total of 16 mL was utilized.  A 15-blade was used to make an incision overlying tooth #19.  According to the [employer], there was a retained root in this area.  The periosteum was reflected and bone was removed.  Small tooth fragment was observed and removed and then the area was sutured with 3-0 chromic.  Then, the anterior mandible was encountered next.  A 15-blade was used to make an incision around teeth numbers 23, 24, 25, 26.  The periosteum was reflected with a periosteal elevator.  Tooth #23 fractured upon attempted removal and the roots of 23, 24, and 25, were removed using the Stryker handpiece to  remove bone and the 301 elevator and the rongeurs.  Tooth #26 was removed with the Asch forceps, then alveoplasty was performed with the rongeur and the bone file.  The area was irrigated and closed with 3-0 chromic.  The maxilla was encountered next and a 15-blade was used to make an incision around teeth number 7, 8, 9, 10, 12, 13, and 14.  The periosteum was reflected.  Bone was removed from around these teeth.  Then tooth #14 required sectioning to remove each root by itself with a rongeur.  The teeth were elevated and removed with the upper forceps, then the alveolar ridge was exposed and egg- shaped bur was used to perform alveoplasty and then the area was further smoothed with a bone file.  The areas was then  irrigated and closed with 3-0 chromic.  The bite block and endotracheal tube were repositioned at the other side of the mouth, and teeth numbers 2 and 3 were extracted using a 15-blade, periosteal elevator, 301 elevator, and dental forceps.  The sockets were curetted.  Alveoplasty was performed using the egg-shaped bur and bone file.  Then, the  area was closed with 3-0 chromic.  The oral cavity was then inspected, found to have good contour hemostasis and closure.  The oral cavity was irrigated and suctioned.  Throat pack was removed.  The patient was awakened and taken to the recovery room breathing spontaneously in good condition.  ESTIMATED BLOOD LOSS:  Minimum.  COMPLICATIONS:  None.  SPECIMENS:  None.     Georgia Lopes, M.D.     SMJ/MEDQ  D:  01/06/2013  T:  01/07/2013  Job:  [id number]

## 2013-01-07 NOTE — Progress Notes (Signed)
Patient has F/U at the [facility] and per [facility] she is due to follow back up Jan 2015

## 2013-01-08 ENCOUNTER — Encounter (HOSPITAL_COMMUNITY): Payer: Self-pay | Admitting: Oral Surgery

## 2013-01-22 ENCOUNTER — Encounter: Payer: Self-pay | Admitting: Adult Health

## 2013-01-22 ENCOUNTER — Encounter (INDEPENDENT_AMBULATORY_CARE_PROVIDER_SITE_OTHER): Payer: Self-pay

## 2013-01-22 ENCOUNTER — Ambulatory Visit (INDEPENDENT_AMBULATORY_CARE_PROVIDER_SITE_OTHER): Payer: Medicaid Other | Admitting: Adult Health

## 2013-01-22 VITALS — BP 140/80 | Ht 61.0 in | Wt 146.0 lb

## 2013-01-22 DIAGNOSIS — N390 Urinary tract infection, site not specified: Secondary | ICD-10-CM

## 2013-01-22 DIAGNOSIS — Z113 Encounter for screening for infections with a predominantly sexual mode of transmission: Secondary | ICD-10-CM

## 2013-01-22 HISTORY — DX: Urinary tract infection, site not specified: N39.0

## 2013-01-22 LAB — POCT URINALYSIS DIPSTICK
Glucose, UA: NEGATIVE
Nitrite, UA: POSITIVE
Protein, UA: NEGATIVE

## 2013-01-22 MED ORDER — PHENAZOPYRIDINE HCL 200 MG PO TABS: 200.0000 mg | ORAL_TABLET | Freq: Three times a day (TID) | ORAL | Status: DC | PRN

## 2013-01-22 MED ORDER — NITROFURANTOIN MONOHYD MACRO 100 MG PO CAPS: 100.0000 mg | ORAL_CAPSULE | Freq: Two times a day (BID) | ORAL | Status: DC
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
## 2013-01-22 NOTE — Patient Instructions (Signed)
Urinary Tract Infection Urinary tract infections (UTIs) can develop anywhere along your urinary tract. Your urinary tract is your body's drainage system for removing wastes and extra water. Your urinary tract includes two kidneys, two ureters, a bladder, and a urethra. Your kidneys are a pair of bean-shaped organs. Each kidney is about the size of your fist. They are located below your ribs, one on each side of your spine. CAUSES Infections are caused by microbes, which are microscopic organisms, including fungi, viruses, and bacteria. These organisms are so small that they can only be seen through a microscope. Bacteria are the microbes that most commonly cause UTIs. SYMPTOMS  Symptoms of UTIs may vary by age and gender of the patient and by the location of the infection. Symptoms in young women typically include a frequent and intense urge to urinate and a painful, burning feeling in the bladder or urethra during urination. Older women and men are more likely to be tired, shaky, and weak and have muscle aches and abdominal pain. A fever may mean the infection is in your kidneys. Other symptoms of a kidney infection include pain in your back or sides below the ribs, nausea, and vomiting. DIAGNOSIS To diagnose a UTI, your caregiver will ask you about your symptoms. Your caregiver also will ask to provide a urine sample. The urine sample will be tested for bacteria and white blood cells. White blood cells are made by your body to help fight infection. TREATMENT  Typically, UTIs can be treated with medication. Because most UTIs are caused by a bacterial infection, they usually can be treated with the use of antibiotics. The choice of antibiotic and length of treatment depend on your symptoms and the type of bacteria causing your infection. HOME CARE INSTRUCTIONS  If you were prescribed antibiotics, take them exactly as your caregiver instructs you. Finish the medication even if you feel better after you  have only taken some of the medication.  Drink enough water and fluids to keep your urine clear or pale yellow.  Avoid caffeine, tea, and carbonated beverages. They tend to irritate your bladder.  Empty your bladder often. Avoid holding urine for long periods of time.  Empty your bladder before and after sexual intercourse.  After a bowel movement, women should cleanse from front to back. Use each tissue only once. SEEK MEDICAL CARE IF:   You have back pain.  You develop a fever.  Your symptoms do not begin to resolve within 3 days. SEEK IMMEDIATE MEDICAL CARE IF:   You have severe back pain or lower abdominal pain.  You develop chills.  You have nausea or vomiting.  You have continued burning or discomfort with urination. MAKE SURE YOU:   Understand these instructions.  Will watch your condition.  Will get help right away if you are not doing well or get worse. Document Released: 11/23/2004 Document Revised: 08/15/2011 Document Reviewed: 03/24/2011 [facility] Patient Information 2014 [place], [place]. Take macrobid Use pyridium Increase fluid Return in 8 days Decrease vesicare to 10 mg daily for now

## 2013-01-23 LAB — URINALYSIS
Bilirubin Urine: NEGATIVE
Glucose, UA: NEGATIVE mg/dL
Protein, ur: NEGATIVE mg/dL
Urobilinogen, UA: 0.2 mg/dL (ref 0.0–1.0)

## 2013-01-23 LAB — GC/CHLAMYDIA PROBE AMP: GC Probe RNA: NEGATIVE

## 2013-01-25 LAB — URINE CULTURE

## 2013-01-28 ENCOUNTER — Telehealth: Payer: Self-pay | Admitting: *Deleted

## 2013-01-28 ENCOUNTER — Encounter: Payer: Medicaid Other | Admitting: Internal Medicine

## 2013-01-28 NOTE — Telephone Encounter (Signed)
Pt stated that she found her vesicare, Pt said everything was good.

## 2013-01-30 ENCOUNTER — Ambulatory Visit (INDEPENDENT_AMBULATORY_CARE_PROVIDER_SITE_OTHER): Payer: Medicaid Other | Admitting: Adult Health

## 2013-01-30 ENCOUNTER — Encounter (INDEPENDENT_AMBULATORY_CARE_PROVIDER_SITE_OTHER): Payer: Self-pay

## 2013-01-30 ENCOUNTER — Encounter: Payer: Self-pay | Admitting: Adult Health

## 2013-01-30 VITALS — BP 140/98 | Ht 61.0 in | Wt 142.0 lb

## 2013-01-30 DIAGNOSIS — Z87440 Personal history of urinary (tract) infections: Secondary | ICD-10-CM

## 2013-01-30 DIAGNOSIS — N390 Urinary tract infection, site not specified: Secondary | ICD-10-CM

## 2013-01-30 NOTE — Patient Instructions (Signed)
Push fluids don't your pee Follow up prn

## 2013-01-30 NOTE — Progress Notes (Signed)
Subjective:     Patient ID: [name], female   DOB: 30-Dec-1960, 52 y.o.   MRN: [id number]  HPI Cecilie is back today for recheck after UTI,and she feels better.Her urine did show E-coli and she took all the meds.Still with some urge incontinence taking vesiscare..   Review of Systems See HPI Reviewed past medical,surgical, social and family history. Reviewed medications and allergies.     Objective:   Physical Exam BP 140/98  Ht 5\' 1"  (1.549 m)  Wt 142 lb (64.411 kg)  BMI 26.84 kg/m2   feels better, will send urine for UA C&S Assessment:     History of UTI    Plan:     UA C&S Push fluids Don't hold pee too long Follow up prn

## 2013-01-31 LAB — URINE CULTURE: Organism ID, Bacteria: NO GROWTH

## 2013-01-31 LAB — URINALYSIS
Bilirubin Urine: NEGATIVE
Leukocytes, UA: NEGATIVE
Nitrite: NEGATIVE
Protein, ur: NEGATIVE mg/dL
Specific Gravity, Urine: 1.01 (ref 1.005–1.030)
Urobilinogen, UA: 0.2 mg/dL (ref 0.0–1.0)
pH: 6 (ref 5.0–8.0)

## 2013-02-11 ENCOUNTER — Ambulatory Visit: Payer: Medicaid Other | Admitting: Internal Medicine

## 2013-02-11 ENCOUNTER — Telehealth: Payer: Self-pay

## 2013-02-11 ENCOUNTER — Encounter (INDEPENDENT_AMBULATORY_CARE_PROVIDER_SITE_OTHER): Payer: Self-pay

## 2013-02-11 ENCOUNTER — Encounter: Payer: Self-pay | Admitting: Internal Medicine

## 2013-02-11 VITALS — BP 128/79 | HR 92 | Temp 98.4°F | Wt 145.0 lb

## 2013-02-11 DIAGNOSIS — K648 Other hemorrhoids: Secondary | ICD-10-CM

## 2013-02-11 NOTE — Progress Notes (Signed)
[facility] banding procedure note:  The patient presents with symptomatic grade 2 hemorrhoids, unresponsive to maximal medical therapy, requesting rubber band ligation of his/her hemorrhoidal disease. All risks, benefits, and alternative forms of therapy were described and informed consent was obtained.  In the left lateral decubitus position  anoscopic examination revealed prominent  grade 2 hemorrhoids in the right anterior and right posterior location..  The decision was made to band the Right anterior internal hemorrhoid and the [facility] System was used to perform band ligation without complication. Digital anorectal examination was then performed to assure proper positioning of the band, and to adjust the banded tissue as required. There was pressure but no pinching or pain after placement. Followup digital rectal exam revealed the band to be in appropriate position. The patient was discharged home without pain or other issues. Dietary and behavioral recommendations were given and (if necessary prescriptions were given), along with follow-up instructions. The patient will return in 2- 3 weeks for followup and possible additional banding as required.  No complications were encountered and the patient tolerated the procedure well.

## 2013-02-11 NOTE — Patient Instructions (Addendum)
Avoid straining.  Benefiber 2 teaspoons twice daily  Do not take anymore Amitiza; continue Linzess 145 capsule daily  Limit toilet time to 2-3 minutes  Call with any interim problems  Schedule followup appointment in 2-3 weeks from now

## 2013-02-11 NOTE — Telephone Encounter (Signed)
FYI--During discharge after hemorrhoid banding procedure- pt asked if [facility] could call in diflucan for her because she has a vaginal yeast infection and Dr. Juanetta Gosling had already given her one diflucan and it didn't work, she was still having vaginal drainage. Advised pt that she needed to call Dr. Juanetta Gosling and ask him to refill the medication for her. She stated she would call his office today and ask for a refill. She also said Dr. Juanetta Gosling was treating her for a UTI.

## 2013-02-14 ENCOUNTER — Encounter: Payer: Self-pay | Admitting: General Practice

## 2013-02-25 ENCOUNTER — Telehealth: Payer: Self-pay | Admitting: Obstetrics & Gynecology

## 2013-02-26 ENCOUNTER — Telehealth: Payer: Self-pay

## 2013-02-26 ENCOUNTER — Telehealth: Payer: Self-pay | Admitting: Adult Health

## 2013-02-26 MED ORDER — NITROFURANTOIN MONOHYD MACRO 100 MG PO CAPS: 100.0000 mg | ORAL_CAPSULE | Freq: Two times a day (BID) | ORAL | Status: DC

## 2013-02-26 NOTE — Telephone Encounter (Signed)
Spoke with pt. Pt states she is having burning with urination again. Can you order med? Uses [facility]. Thanks!!!

## 2013-02-26 NOTE — Telephone Encounter (Signed)
Pt states needs abx for "bladder infection." Informed pt message routed to Cyril Mourning, NP.

## 2013-02-26 NOTE — Telephone Encounter (Signed)
Left message will call macrobid in

## 2013-03-03 NOTE — Telephone Encounter (Signed)
macrobid was e prescribed on 12/31

## 2013-03-11 ENCOUNTER — Encounter: Payer: Medicaid Other | Admitting: Internal Medicine

## 2013-03-12 ENCOUNTER — Encounter: Payer: Medicaid Other | Admitting: Internal Medicine

## 2013-03-12 ENCOUNTER — Telehealth: Payer: Self-pay | Admitting: Internal Medicine

## 2013-03-12 NOTE — Telephone Encounter (Signed)
Pt was a no show

## 2013-04-04 ENCOUNTER — Encounter: Payer: Self-pay | Admitting: Internal Medicine

## 2013-04-12 ENCOUNTER — Encounter (HOSPITAL_COMMUNITY): Payer: Self-pay | Admitting: Emergency Medicine

## 2013-04-12 ENCOUNTER — Emergency Department (HOSPITAL_COMMUNITY)
Admission: EM | Admit: 2013-04-12 | Discharge: 2013-04-12 | Disposition: A | Discharge status: Home / Self Care | Payer: Medicaid Other | Attending: Emergency Medicine | Admitting: Emergency Medicine

## 2013-04-12 DIAGNOSIS — Z792 Long term (current) use of antibiotics: Secondary | ICD-10-CM | POA: Insufficient documentation

## 2013-04-12 DIAGNOSIS — G43909 Migraine, unspecified, not intractable, without status migrainosus: Secondary | ICD-10-CM | POA: Insufficient documentation

## 2013-04-12 DIAGNOSIS — R1013 Epigastric pain: Secondary | ICD-10-CM | POA: Insufficient documentation

## 2013-04-12 DIAGNOSIS — Z862 Personal history of diseases of the blood and blood-forming organs and certain disorders involving the immune mechanism: Secondary | ICD-10-CM | POA: Insufficient documentation

## 2013-04-12 DIAGNOSIS — R1110 Vomiting, unspecified: Secondary | ICD-10-CM | POA: Insufficient documentation

## 2013-04-12 DIAGNOSIS — I10 Essential (primary) hypertension: Secondary | ICD-10-CM | POA: Insufficient documentation

## 2013-04-12 DIAGNOSIS — Z9889 Other specified postprocedural states: Secondary | ICD-10-CM | POA: Insufficient documentation

## 2013-04-12 DIAGNOSIS — Z87440 Personal history of urinary (tract) infections: Secondary | ICD-10-CM | POA: Insufficient documentation

## 2013-04-12 DIAGNOSIS — R1011 Right upper quadrant pain: Secondary | ICD-10-CM | POA: Insufficient documentation

## 2013-04-12 DIAGNOSIS — K219 Gastro-esophageal reflux disease without esophagitis: Secondary | ICD-10-CM

## 2013-04-12 DIAGNOSIS — F319 Bipolar disorder, unspecified: Secondary | ICD-10-CM | POA: Insufficient documentation

## 2013-04-12 DIAGNOSIS — Z880 Allergy status to penicillin: Secondary | ICD-10-CM | POA: Insufficient documentation

## 2013-04-12 DIAGNOSIS — Z8639 Personal history of other endocrine, nutritional and metabolic disease: Secondary | ICD-10-CM | POA: Insufficient documentation

## 2013-04-12 DIAGNOSIS — IMO0002 Reserved for concepts with insufficient information to code with codable children: Secondary | ICD-10-CM | POA: Insufficient documentation

## 2013-04-12 DIAGNOSIS — Z79899 Other long term (current) drug therapy: Secondary | ICD-10-CM | POA: Insufficient documentation

## 2013-04-12 DIAGNOSIS — K029 Dental caries, unspecified: Secondary | ICD-10-CM | POA: Insufficient documentation

## 2013-04-12 DIAGNOSIS — K5900 Constipation, unspecified: Secondary | ICD-10-CM | POA: Insufficient documentation

## 2013-04-12 DIAGNOSIS — Z8619 Personal history of other infectious and parasitic diseases: Secondary | ICD-10-CM | POA: Insufficient documentation

## 2013-04-12 DIAGNOSIS — M129 Arthropathy, unspecified: Secondary | ICD-10-CM | POA: Insufficient documentation

## 2013-04-12 DIAGNOSIS — F17200 Nicotine dependence, unspecified, uncomplicated: Secondary | ICD-10-CM | POA: Insufficient documentation

## 2013-04-12 DIAGNOSIS — F411 Generalized anxiety disorder: Secondary | ICD-10-CM | POA: Insufficient documentation

## 2013-04-12 LAB — CBC WITH DIFFERENTIAL/PLATELET
Basophils Absolute: 0 10*3/uL (ref 0.0–0.1)
Basophils Relative: 1 % (ref 0–1)
EOS ABS: 0.1 10*3/uL (ref 0.0–0.7)
Eosinophils Relative: 2 % (ref 0–5)
HCT: 40 % (ref 36.0–46.0)
Hemoglobin: 13.6 g/dL (ref 12.0–15.0)
LYMPHS ABS: 1.6 10*3/uL (ref 0.7–4.0)
LYMPHS PCT: 39 % (ref 12–46)
MCH: 30.5 pg (ref 26.0–34.0)
MCHC: 34 g/dL (ref 30.0–36.0)
MCV: 89.7 fL (ref 78.0–100.0)
Monocytes Absolute: 0.2 10*3/uL (ref 0.1–1.0)
Monocytes Relative: 6 % (ref 3–12)
NEUTROS PCT: 52 % (ref 43–77)
Neutro Abs: 2.2 10*3/uL (ref 1.7–7.7)
PLATELETS: 222 10*3/uL (ref 150–400)
RBC: 4.46 MIL/uL (ref 3.87–5.11)
RDW: 13.7 % (ref 11.5–15.5)
WBC: 4.1 10*3/uL (ref 4.0–10.5)

## 2013-04-12 LAB — COMPREHENSIVE METABOLIC PANEL
ALT: 20 U/L (ref 0–35)
AST: 24 U/L (ref 0–37)
Albumin: 3.7 g/dL (ref 3.5–5.2)
Alkaline Phosphatase: 81 U/L (ref 39–117)
BILIRUBIN TOTAL: 0.2 mg/dL — AB (ref 0.3–1.2)
BUN: 8 mg/dL (ref 6–23)
CHLORIDE: 100 meq/L (ref 96–112)
CO2: 28 meq/L (ref 19–32)
CREATININE: 0.78 mg/dL (ref 0.50–1.10)
Calcium: 9.3 mg/dL (ref 8.4–10.5)
GFR calc Af Amer: >90 mL/min (ref >90)
Glucose, Bld: 76 mg/dL (ref 70–99)
Potassium: 4.1 mEq/L (ref 3.7–5.3)
Sodium: 138 mEq/L (ref 137–147)
Total Protein: 7.3 g/dL (ref 6.0–8.3)

## 2013-04-12 LAB — LIPASE, BLOOD: Lipase: 15 U/L (ref 11–59)

## 2013-04-12 MED ORDER — CLINDAMYCIN HCL 300 MG PO CAPS: 300.0000 mg | ORAL_CAPSULE | Freq: Four times a day (QID) | ORAL | Status: DC

## 2013-04-12 MED ORDER — SODIUM CHLORIDE 0.9 % IV BOLUS (SEPSIS)
1000.0000 mL | Freq: Once | INTRAVENOUS | Status: AC
  Administered 2013-04-12: 1000 mL via INTRAVENOUS

## 2013-04-12 MED ORDER — ONDANSETRON HCL 4 MG/2ML IJ SOLN
4.0000 mg | Freq: Once | INTRAMUSCULAR | Status: AC
  Administered 2013-04-12: 4 mg via INTRAVENOUS
  Filled 2013-04-12: qty 2

## 2013-04-12 MED ORDER — MORPHINE SULFATE 4 MG/ML IJ SOLN
4.0000 mg | Freq: Once | INTRAMUSCULAR | Status: AC
  Administered 2013-04-12: 4 mg via INTRAVENOUS
  Filled 2013-04-12: qty 1

## 2013-04-12 MED ORDER — PROMETHAZINE HCL 25 MG PO TABS: 25.0000 mg | ORAL_TABLET | Freq: Four times a day (QID) | ORAL | Status: DC | PRN

## 2013-04-12 MED ORDER — OXYCODONE-ACETAMINOPHEN 5-325 MG PO TABS: 2.0000 | ORAL_TABLET | ORAL | Status: DC | PRN

## 2013-04-12 NOTE — Discharge Instructions (Signed)
Ultrasound of the gallbladder is will be scheduled for next Thursday April 17, 2013 at 9:00 am. Do not eat or drink after 3:00am. Nurse will give you more instructions. Medication for pain, nausea, antibiotic.
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
## 2013-04-12 NOTE — ED Notes (Signed)
Pt states dental pain. Also states burning sensation to epigastric area and vomiting. Hx of GERD. States unsure if she could have "gallstones or if  tooth is causing the vomiting". States vomited x 4 today. Last vomited at 11am.

## 2013-04-17 ENCOUNTER — Ambulatory Visit (HOSPITAL_COMMUNITY): Admit: 2013-04-17 | Payer: Medicaid Other

## 2013-04-17 ENCOUNTER — Other Ambulatory Visit (HOSPITAL_COMMUNITY): Payer: Medicaid Other

## 2013-04-17 ENCOUNTER — Other Ambulatory Visit (HOSPITAL_COMMUNITY): Payer: Self-pay | Admitting: Emergency Medicine

## 2013-04-17 ENCOUNTER — Ambulatory Visit (HOSPITAL_COMMUNITY)
Admission: RE | Admit: 2013-04-17 | Discharge: 2013-04-17 | Disposition: A | Discharge status: Home / Self Care | Payer: Medicaid Other | Source: Ambulatory Visit | Attending: Emergency Medicine | Admitting: Emergency Medicine

## 2013-04-17 DIAGNOSIS — R1010 Upper abdominal pain, unspecified: Secondary | ICD-10-CM

## 2013-04-17 DIAGNOSIS — R109 Unspecified abdominal pain: Secondary | ICD-10-CM | POA: Diagnosis present

## 2013-05-05 ENCOUNTER — Other Ambulatory Visit: Payer: Self-pay | Admitting: Nurse Practitioner

## 2013-05-05 DIAGNOSIS — C220 Liver cell carcinoma: Secondary | ICD-10-CM

## 2013-05-06 ENCOUNTER — Ambulatory Visit (INDEPENDENT_AMBULATORY_CARE_PROVIDER_SITE_OTHER): Payer: Medicaid Other | Admitting: Internal Medicine

## 2013-05-06 ENCOUNTER — Encounter: Payer: Self-pay | Admitting: Internal Medicine

## 2013-05-06 VITALS — BP 130/73 | HR 95 | Temp 97.6°F | Wt 149.2 lb

## 2013-05-06 DIAGNOSIS — K648 Other hemorrhoids: Secondary | ICD-10-CM

## 2013-05-06 MED ORDER — LINACLOTIDE 145 MCG PO CAPS: 145.0000 ug | ORAL_CAPSULE | Freq: Every day | ORAL | Status: DC

## 2013-05-06 NOTE — Patient Instructions (Signed)
Avoid straining.  Benefiber 2 teaspoons twice daily  Resume Linzess145 daily  Limit toilet time to 2-3 minutes  Call with any interim problems  Schedule followup appointment in 2-3 weeks from now

## 2013-05-06 NOTE — Addendum Note (Signed)
Addended by: Claudina Lick on: 05/06/2013 09:59 AM   Modules accepted: Orders

## 2013-05-06 NOTE — Progress Notes (Signed)
[place] banding procedure note:  Right anterior hemorrhoid banded in December of last year. Bleeding improved. Still constipated but ran out of Linzess. History of iron deficiency anemia-CBC normal last month. EGD with duodenal biopsy -2013. Benign polyp removed from her colon 2013 diverticulosis. Patient states a placing a band help significantly bleeding has certainly diminished. She wishes another band placed today.  The patient presents with symptomatic grade 3 hemorrhoids.   All risks, benefits, and alternative forms of therapy were described and informed consent was obtained.  In the left lateral decubitus position, digital rectal exam performed.  One small reducible external hemorrhoidal tag noted.  The decision was made to band the right posterior internal hemorrhoid and the [place] was used to perform band ligation without complication. Followup digital rectal exam performed. Band in good position. No pinching or pain. No complications were encountered and the patient tolerated the procedure well.  See instructions.

## 2013-05-09 ENCOUNTER — Encounter: Payer: Medicaid Other | Admitting: Internal Medicine

## 2013-05-12 ENCOUNTER — Other Ambulatory Visit: Payer: Medicaid Other

## 2013-06-17 ENCOUNTER — Encounter: Payer: Self-pay | Admitting: Internal Medicine

## 2013-06-17 ENCOUNTER — Telehealth: Payer: Self-pay | Admitting: Internal Medicine

## 2013-06-17 ENCOUNTER — Encounter (INDEPENDENT_AMBULATORY_CARE_PROVIDER_SITE_OTHER): Payer: Self-pay

## 2013-06-17 ENCOUNTER — Ambulatory Visit (INDEPENDENT_AMBULATORY_CARE_PROVIDER_SITE_OTHER): Payer: Medicaid Other | Admitting: Internal Medicine

## 2013-06-17 VITALS — BP 133/83 | HR 114 | Temp 98.0°F | Ht 68.0 in | Wt 149.8 lb

## 2013-06-17 DIAGNOSIS — K648 Other hemorrhoids: Secondary | ICD-10-CM

## 2013-06-17 NOTE — Telephone Encounter (Signed)
Pt was seen today by RMR and before she left she wanted to know if a fiber pill comes in a prescription form so she can get it with her medicaid cheaper. Please advise and call her to let her know. She uses [facility] in [place]

## 2013-06-17 NOTE — Progress Notes (Signed)
[place] banding procedure note:  Constipation better with Linzess 145 daily. Does not like taking Metamucil powder. Overall bleeding, hygiene, discharge, burning and pain improved - status post 2 band placements previously. She would like a third band placed today. All risks, benefits, and alternative forms of therapy were described and informed consent was obtained.  In the left lateral decubitus position, a digital rectal exam was performed. The decision was made to band the left lateral internal hemorrhoid; the [place] was used to perform band ligation without complication. Digital anorectal examination was then performed to assure proper positioning of the band; band in excellent position. No pinching or pain. The patient was discharged home without pain or other issues. Dietary and behavioral recommendations were given.  The patient will return in 6 months for followup.  No complications were encountered and the patient tolerated the procedure well.

## 2013-06-17 NOTE — Patient Instructions (Signed)
Avoid straining.  Use a daily fiber supplement - Suggest Metamucil caplets her weight first daily as discussed  Limit toilet time to 2-3 minutes  Call with any interim problems  Schedule followup appointment in 6 months from now

## 2013-06-19 NOTE — Telephone Encounter (Signed)
Tried to call pt- NA- LMOM, informed her that fiber is otc.

## 2013-07-15 ENCOUNTER — Emergency Department (HOSPITAL_COMMUNITY)
Admission: EM | Admit: 2013-07-15 | Discharge: 2013-07-15 | Disposition: A | Discharge status: Home / Self Care | Payer: Medicaid Other | Attending: Emergency Medicine | Admitting: Emergency Medicine

## 2013-07-15 ENCOUNTER — Encounter (HOSPITAL_COMMUNITY): Payer: Self-pay | Admitting: Emergency Medicine

## 2013-07-15 DIAGNOSIS — Z880 Allergy status to penicillin: Secondary | ICD-10-CM | POA: Insufficient documentation

## 2013-07-15 DIAGNOSIS — Z79899 Other long term (current) drug therapy: Secondary | ICD-10-CM | POA: Insufficient documentation

## 2013-07-15 DIAGNOSIS — R110 Nausea: Secondary | ICD-10-CM | POA: Insufficient documentation

## 2013-07-15 DIAGNOSIS — F17200 Nicotine dependence, unspecified, uncomplicated: Secondary | ICD-10-CM | POA: Insufficient documentation

## 2013-07-15 DIAGNOSIS — Z8639 Personal history of other endocrine, nutritional and metabolic disease: Secondary | ICD-10-CM | POA: Insufficient documentation

## 2013-07-15 DIAGNOSIS — I10 Essential (primary) hypertension: Secondary | ICD-10-CM | POA: Insufficient documentation

## 2013-07-15 DIAGNOSIS — F319 Bipolar disorder, unspecified: Secondary | ICD-10-CM | POA: Insufficient documentation

## 2013-07-15 DIAGNOSIS — M129 Arthropathy, unspecified: Secondary | ICD-10-CM | POA: Insufficient documentation

## 2013-07-15 DIAGNOSIS — Z9851 Tubal ligation status: Secondary | ICD-10-CM | POA: Insufficient documentation

## 2013-07-15 DIAGNOSIS — Z9889 Other specified postprocedural states: Secondary | ICD-10-CM | POA: Insufficient documentation

## 2013-07-15 DIAGNOSIS — R51 Headache: Secondary | ICD-10-CM | POA: Insufficient documentation

## 2013-07-15 DIAGNOSIS — G43909 Migraine, unspecified, not intractable, without status migrainosus: Secondary | ICD-10-CM | POA: Insufficient documentation

## 2013-07-15 DIAGNOSIS — Z862 Personal history of diseases of the blood and blood-forming organs and certain disorders involving the immune mechanism: Secondary | ICD-10-CM | POA: Insufficient documentation

## 2013-07-15 DIAGNOSIS — F411 Generalized anxiety disorder: Secondary | ICD-10-CM | POA: Insufficient documentation

## 2013-07-15 DIAGNOSIS — N390 Urinary tract infection, site not specified: Secondary | ICD-10-CM | POA: Insufficient documentation

## 2013-07-15 DIAGNOSIS — K219 Gastro-esophageal reflux disease without esophagitis: Secondary | ICD-10-CM | POA: Insufficient documentation

## 2013-07-15 LAB — CBC WITH DIFFERENTIAL/PLATELET
Basophils Absolute: 0 10*3/uL (ref 0.0–0.1)
Basophils Relative: 0 % (ref 0–1)
EOS ABS: 0.1 10*3/uL (ref 0.0–0.7)
EOS PCT: 1 % (ref 0–5)
HEMATOCRIT: 38.1 % (ref 36.0–46.0)
Hemoglobin: 12.9 g/dL (ref 12.0–15.0)
LYMPHS PCT: 29 % (ref 12–46)
Lymphs Abs: 2.1 10*3/uL (ref 0.7–4.0)
MCH: 29.5 pg (ref 26.0–34.0)
MCHC: 33.9 g/dL (ref 30.0–36.0)
MCV: 87 fL (ref 78.0–100.0)
MONO ABS: 0.4 10*3/uL (ref 0.1–1.0)
Monocytes Relative: 5 % (ref 3–12)
Neutro Abs: 4.7 10*3/uL (ref 1.7–7.7)
Neutrophils Relative %: 65 % (ref 43–77)
PLATELETS: 224 10*3/uL (ref 150–400)
RBC: 4.38 MIL/uL (ref 3.87–5.11)
RDW: 13.2 % (ref 11.5–15.5)
WBC: 7.3 10*3/uL (ref 4.0–10.5)

## 2013-07-15 LAB — COMPREHENSIVE METABOLIC PANEL
ALT: 21 U/L (ref 0–35)
AST: 24 U/L (ref 0–37)
Albumin: 4 g/dL (ref 3.5–5.2)
Alkaline Phosphatase: 65 U/L (ref 39–117)
BUN: 12 mg/dL (ref 6–23)
CALCIUM: 9.3 mg/dL (ref 8.4–10.5)
CO2: 25 meq/L (ref 19–32)
CREATININE: 0.78 mg/dL (ref 0.50–1.10)
Chloride: 102 mEq/L (ref 96–112)
GFR calc non Af Amer: >90 mL/min (ref >90)
Glucose, Bld: 119 mg/dL — ABNORMAL HIGH (ref 70–99)
Potassium: 3.7 mEq/L (ref 3.7–5.3)
SODIUM: 140 meq/L (ref 137–147)
TOTAL PROTEIN: 7 g/dL (ref 6.0–8.3)
Total Bilirubin: 0.4 mg/dL (ref 0.3–1.2)

## 2013-07-15 LAB — URINALYSIS, ROUTINE W REFLEX MICROSCOPIC
Bilirubin Urine: NEGATIVE
Glucose, UA: NEGATIVE mg/dL
Ketones, ur: NEGATIVE mg/dL
Nitrite: POSITIVE — AB
PH: 6 (ref 5.0–8.0)
Protein, ur: NEGATIVE mg/dL
SPECIFIC GRAVITY, URINE: 1.01 (ref 1.005–1.030)
UROBILINOGEN UA: 0.2 mg/dL (ref 0.0–1.0)

## 2013-07-15 LAB — RAPID URINE DRUG SCREEN, HOSP PERFORMED
Amphetamines: POSITIVE — AB
BENZODIAZEPINES: POSITIVE — AB
Barbiturates: NOT DETECTED
Cocaine: NOT DETECTED
Opiates: POSITIVE — AB
Tetrahydrocannabinol: NOT DETECTED

## 2013-07-15 LAB — URINE MICROSCOPIC-ADD ON

## 2013-07-15 LAB — LIPASE, BLOOD: Lipase: 19 U/L (ref 11–59)

## 2013-07-15 MED ORDER — LORAZEPAM 2 MG/ML IJ SOLN
1.0000 mg | Freq: Once | INTRAMUSCULAR | Status: AC
  Administered 2013-07-15: 1 mg via INTRAVENOUS
  Filled 2013-07-15: qty 1

## 2013-07-15 MED ORDER — NITROFURANTOIN MONOHYD MACRO 100 MG PO CAPS: 100.0000 mg | ORAL_CAPSULE | Freq: Two times a day (BID) | ORAL | Status: DC

## 2013-07-15 MED ORDER — PHENAZOPYRIDINE HCL 200 MG PO TABS: 200.0000 mg | ORAL_TABLET | Freq: Three times a day (TID) | ORAL | Status: DC

## 2013-07-15 MED ORDER — FENTANYL CITRATE 0.05 MG/ML IJ SOLN
100.0000 ug | Freq: Once | INTRAMUSCULAR | Status: AC
  Administered 2013-07-15: 100 ug via INTRAVENOUS
  Filled 2013-07-15: qty 2

## 2013-07-15 MED ORDER — METOCLOPRAMIDE HCL 5 MG/ML IJ SOLN
10.0000 mg | Freq: Once | INTRAMUSCULAR | Status: AC
Start: 2013-07-15 — End: 2013-07-15
  Administered 2013-07-15: 10 mg via INTRAVENOUS
  Filled 2013-07-15: qty 2

## 2013-07-15 MED ORDER — ONDANSETRON HCL 4 MG PO TABS: 4.0000 mg | ORAL_TABLET | Freq: Four times a day (QID) | ORAL | Status: DC

## 2013-07-15 MED ORDER — SODIUM CHLORIDE 0.9 % IV BOLUS (SEPSIS)
1000.0000 mL | Freq: Once | INTRAVENOUS | Status: AC
  Administered 2013-07-15: 1000 mL via INTRAVENOUS

## 2013-07-15 MED ORDER — FAMOTIDINE IN NACL 20-0.9 MG/50ML-% IV SOLN
20.0000 mg | Freq: Once | INTRAVENOUS | Status: AC
  Administered 2013-07-15: 20 mg via INTRAVENOUS
  Filled 2013-07-15: qty 50

## 2013-07-15 MED ORDER — DIPHENHYDRAMINE HCL 50 MG/ML IJ SOLN
25.0000 mg | Freq: Once | INTRAMUSCULAR | Status: AC
  Administered 2013-07-15: 25 mg via INTRAVENOUS
  Filled 2013-07-15: qty 1

## 2013-07-15 MED ORDER — NITROFURANTOIN MONOHYD MACRO 100 MG PO CAPS
100.0000 mg | ORAL_CAPSULE | Freq: Once | ORAL | Status: AC
  Administered 2013-07-15: 100 mg via ORAL
  Filled 2013-07-15: qty 1

## 2013-07-15 NOTE — Discharge Instructions (Signed)
Drink plenty of fluids. Take the pyridium for pain on urination, it will turn your urine orange, wear old underwear or a pad so your underwear doesn't get stained. Take the antibiotic until gone. Return if you get a fever, vomiting or you seem worse instead of better. You will need to talk to your doctor who prescribes your pain medications to get more.    Urinary Tract Infection A urinary tract infection (UTI) can occur any place along the urinary tract. The tract includes the kidneys, ureters, bladder, and urethra. A type of germ called bacteria often causes a UTI. UTIs are often helped with antibiotic medicine.  HOME CARE   If given, take antibiotics as told by your doctor. Finish them even if you start to feel better.  Drink enough fluids to keep your pee (urine) clear or pale yellow.  Avoid tea, drinks with caffeine, and bubbly (carbonated) drinks.  Pee often. Avoid holding your pee in for a long time.  Pee before and after having sex (intercourse).  Wipe from front to back after you poop (bowel movement) if you are a woman. Use each tissue only once. GET HELP RIGHT AWAY IF:   You have back pain.  You have lower belly (abdominal) pain.  You have chills.  You feel sick to your stomach (nauseous).  You throw up (vomit).  Your burning or discomfort with peeing does not go away.  You have a fever.  Your symptoms are not better in 3 days. MAKE SURE YOU:   Understand these instructions.  Will watch your condition.  Will get help right away if you are not doing well or get worse. Document Released: 08/02/2007 Document Revised: 11/08/2011 Document Reviewed: 09/14/2011 [facility] Patient Information 2014 [place], [place].  Chronic Pain Discharge Instructions  Emergency care providers appreciate that many patients coming to [place] are in severe pain and we wish to address their pain in the safest, most responsible manner.  It is important to recognize however, that the proper  treatment of chronic pain differs from that of the pain of injuries and acute illnesses.  Our goal is to provide quality, safe, personalized care and we thank you for giving [place] the opportunity to serve you. The use of narcotics and related agents for chronic pain syndromes may lead to additional physical and psychological problems.  Nearly as many people die from prescription narcotics each year as die from car crashes.  Additionally, this risk is increased if such prescriptions are obtained from a variety of sources.  Therefore, only your primary care physician or a pain management specialist is able to safely treat such syndromes with narcotic medications long-term.    Documentation revealing such prescriptions have been sought from multiple sources may prohibit [place] from providing a refill or different narcotic medication.  Your name may be checked first through the [place].  This database is a record of controlled substance medication prescriptions that the patient has received.  This has been established by [facility] in an effort to eliminate the dangerous, and often life threatening, practice of obtaining multiple prescriptions from different medical providers.   If you have a chronic pain syndrome (i.e. chronic headaches, recurrent back or neck pain, dental pain, abdominal or pelvis pain without a specific diagnosis, or neuropathic pain such as fibromyalgia) or recurrent visits for the same condition without an acute diagnosis, you may be treated with non-narcotics and other non-addictive medicines.  Allergic reactions or negative side effects that may  be reported by a patient to such medications will not typically lead to the use of a narcotic analgesic or other controlled substance as an alternative.   Patients managing chronic pain with a personal physician should have provisions in place for breakthrough pain.  If you are in crisis, you should call  your physician.  If your physician directs you to the emergency department, please have the doctor call and speak to our attending physician concerning your care.   When patients come to the Emergency Department (ED) with acute medical conditions in which the Emergency Department physician feels appropriate to prescribe narcotic or sedating pain medication, the physician will prescribe these in very limited quantities.  The amount of these medications will last only until you can see your primary care physician in his/her office.  Any patient who returns to the ED seeking refills should expect only non-narcotic pain medications.   In the event of an acute medical condition exists and the emergency physician feels it is necessary that the patient be given a narcotic or sedating medication -  a responsible adult driver should be present in the room prior to the medication being given by the nurse.   Prescriptions for narcotic or sedating medications that have been lost, stolen or expired will not be refilled in the Emergency Department.    Patients who have chronic pain may receive non-narcotic prescriptions until seen by their primary care physician.  It is every patients personal responsibility to maintain active prescriptions with his or her primary care physician or specialist.

## 2013-07-15 NOTE — ED Notes (Signed)
Awaiting discharge paperwork, patient informed.

## 2013-07-15 NOTE — ED Notes (Signed)
Pt with abd pain for 45 minutes, pt states it's due to transvaginal mesh that was placed last year, denies N/V/D, discomfort with urination

## 2013-07-15 NOTE — ED Notes (Signed)
Patient with no complaints at this time. Respirations even and unlabored. Skin warm/dry. Discharge instructions reviewed with patient at this time. Patient given opportunity to voice concerns/ask questions. Patient discharged at this time and left Emergency Department with steady gait.   

## 2013-07-15 NOTE — ED Notes (Signed)
Patient with rapid speech, pressured speech. Switches subjects quickly. Repeatedly asks for water. RN advised patient she could not offer water at present. Offered and brought patient mouth swabs. Medicated per orders.
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
## 2013-07-15 NOTE — ED Notes (Signed)
Patient walked to restroom with steady gait.

## 2013-07-15 NOTE — ED Notes (Signed)
Patient pulled IV out of her arm. Called RN to room because she was bleeding. Patient states she did not realize that would happen when she pulled it out. Bleeding controlled at this time.

## 2013-07-16 LAB — URINE CULTURE
Colony Count: NO GROWTH
Culture: NO GROWTH

## 2013-07-18 ENCOUNTER — Encounter (HOSPITAL_COMMUNITY): Payer: Self-pay | Admitting: Emergency Medicine

## 2013-07-18 ENCOUNTER — Emergency Department (HOSPITAL_COMMUNITY)
Admission: EM | Admit: 2013-07-18 | Discharge: 2013-07-18 | Disposition: A | Discharge status: Home / Self Care | Payer: Medicaid Other | Attending: Emergency Medicine | Admitting: Emergency Medicine

## 2013-07-18 ENCOUNTER — Emergency Department (HOSPITAL_COMMUNITY): Payer: Medicaid Other

## 2013-07-18 DIAGNOSIS — Z79899 Other long term (current) drug therapy: Secondary | ICD-10-CM | POA: Insufficient documentation

## 2013-07-18 DIAGNOSIS — Z880 Allergy status to penicillin: Secondary | ICD-10-CM | POA: Insufficient documentation

## 2013-07-18 DIAGNOSIS — R102 Pelvic and perineal pain: Secondary | ICD-10-CM

## 2013-07-18 DIAGNOSIS — N760 Acute vaginitis: Secondary | ICD-10-CM | POA: Insufficient documentation

## 2013-07-18 DIAGNOSIS — F319 Bipolar disorder, unspecified: Secondary | ICD-10-CM | POA: Insufficient documentation

## 2013-07-18 DIAGNOSIS — I10 Essential (primary) hypertension: Secondary | ICD-10-CM | POA: Insufficient documentation

## 2013-07-18 DIAGNOSIS — M5430 Sciatica, unspecified side: Secondary | ICD-10-CM | POA: Insufficient documentation

## 2013-07-18 DIAGNOSIS — Z9889 Other specified postprocedural states: Secondary | ICD-10-CM | POA: Insufficient documentation

## 2013-07-18 DIAGNOSIS — F411 Generalized anxiety disorder: Secondary | ICD-10-CM | POA: Insufficient documentation

## 2013-07-18 DIAGNOSIS — Z87440 Personal history of urinary (tract) infections: Secondary | ICD-10-CM | POA: Insufficient documentation

## 2013-07-18 DIAGNOSIS — K219 Gastro-esophageal reflux disease without esophagitis: Secondary | ICD-10-CM | POA: Insufficient documentation

## 2013-07-18 DIAGNOSIS — M129 Arthropathy, unspecified: Secondary | ICD-10-CM | POA: Insufficient documentation

## 2013-07-18 DIAGNOSIS — F17200 Nicotine dependence, unspecified, uncomplicated: Secondary | ICD-10-CM | POA: Insufficient documentation

## 2013-07-18 DIAGNOSIS — Z9851 Tubal ligation status: Secondary | ICD-10-CM | POA: Insufficient documentation

## 2013-07-18 DIAGNOSIS — Z8619 Personal history of other infectious and parasitic diseases: Secondary | ICD-10-CM | POA: Insufficient documentation

## 2013-07-18 DIAGNOSIS — B9689 Other specified bacterial agents as the cause of diseases classified elsewhere: Secondary | ICD-10-CM

## 2013-07-18 DIAGNOSIS — G8929 Other chronic pain: Secondary | ICD-10-CM | POA: Insufficient documentation

## 2013-07-18 DIAGNOSIS — R197 Diarrhea, unspecified: Secondary | ICD-10-CM | POA: Insufficient documentation

## 2013-07-18 DIAGNOSIS — N949 Unspecified condition associated with female genital organs and menstrual cycle: Secondary | ICD-10-CM | POA: Insufficient documentation

## 2013-07-18 LAB — URINE MICROSCOPIC-ADD ON

## 2013-07-18 LAB — URINALYSIS, ROUTINE W REFLEX MICROSCOPIC
Bilirubin Urine: NEGATIVE
Glucose, UA: 100 mg/dL — AB
Hgb urine dipstick: NEGATIVE
Ketones, ur: NEGATIVE mg/dL
LEUKOCYTES UA: NEGATIVE
Nitrite: POSITIVE — AB
Protein, ur: NEGATIVE mg/dL
SPECIFIC GRAVITY, URINE: 1.01 (ref 1.005–1.030)
UROBILINOGEN UA: 0.2 mg/dL (ref 0.0–1.0)
pH: 6 (ref 5.0–8.0)

## 2013-07-18 LAB — WET PREP, GENITAL
TRICH WET PREP: NONE SEEN
Yeast Wet Prep HPF POC: NONE SEEN

## 2013-07-18 MED ORDER — IOHEXOL 300 MG/ML  SOLN
50.0000 mL | Freq: Once | INTRAMUSCULAR | Status: AC | PRN
  Administered 2013-07-18: 50 mL via ORAL

## 2013-07-18 MED ORDER — SODIUM CHLORIDE 0.9 % IV BOLUS (SEPSIS)
500.0000 mL | Freq: Once | INTRAVENOUS | Status: AC
  Administered 2013-07-18: 500 mL via INTRAVENOUS

## 2013-07-18 MED ORDER — FENTANYL CITRATE 0.05 MG/ML IJ SOLN
100.0000 ug | Freq: Once | INTRAMUSCULAR | Status: AC
  Administered 2013-07-18: 100 ug via INTRAVENOUS
  Filled 2013-07-18: qty 2

## 2013-07-18 MED ORDER — METRONIDAZOLE 500 MG PO TABS: 500.0000 mg | ORAL_TABLET | Freq: Two times a day (BID) | ORAL | Status: DC

## 2013-07-18 MED ORDER — IOHEXOL 300 MG/ML  SOLN
100.0000 mL | Freq: Once | INTRAMUSCULAR | Status: AC | PRN
  Administered 2013-07-18: 100 mL via INTRAVENOUS
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
## 2013-07-18 NOTE — ED Notes (Signed)
Patient was treated here on Tuesday, diagnosed with UTI and was given RX for pyridium and Macrobid. Patient reports increased pain to right flank with radiation to right groin. Denies vaginal discharge/bleeding.

## 2013-07-18 NOTE — ED Notes (Signed)
Was seen Tuesday and dx w/UTI.  Has been on Macrobid and Pyridium but pain has progressed and is now radiating around to pelvis bilaterally.

## 2013-07-18 NOTE — Discharge Instructions (Signed)

## 2013-07-23 ENCOUNTER — Other Ambulatory Visit (HOSPITAL_COMMUNITY)
Admission: RE | Admit: 2013-07-23 | Discharge: 2013-07-23 | Disposition: A | Discharge status: Home / Self Care | Payer: Medicaid Other | Source: Ambulatory Visit | Attending: Obstetrics & Gynecology | Admitting: Obstetrics & Gynecology

## 2013-07-23 ENCOUNTER — Encounter: Payer: Self-pay | Admitting: Obstetrics & Gynecology

## 2013-07-23 ENCOUNTER — Ambulatory Visit (INDEPENDENT_AMBULATORY_CARE_PROVIDER_SITE_OTHER): Payer: Medicaid Other | Admitting: Obstetrics & Gynecology

## 2013-07-23 VITALS — BP 130/80 | Ht 62.0 in | Wt 147.0 lb

## 2013-07-23 DIAGNOSIS — Z01419 Encounter for gynecological examination (general) (routine) without abnormal findings: Secondary | ICD-10-CM

## 2013-07-23 DIAGNOSIS — N83209 Unspecified ovarian cyst, unspecified side: Secondary | ICD-10-CM

## 2013-07-23 DIAGNOSIS — Z1212 Encounter for screening for malignant neoplasm of rectum: Secondary | ICD-10-CM

## 2013-07-23 DIAGNOSIS — Z0000 Encounter for general adult medical examination without abnormal findings: Secondary | ICD-10-CM

## 2013-07-23 DIAGNOSIS — Z1151 Encounter for screening for human papillomavirus (HPV): Secondary | ICD-10-CM | POA: Insufficient documentation
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
## 2013-07-31 ENCOUNTER — Other Ambulatory Visit: Payer: Self-pay | Admitting: Obstetrics & Gynecology

## 2013-08-07 ENCOUNTER — Ambulatory Visit (INDEPENDENT_AMBULATORY_CARE_PROVIDER_SITE_OTHER): Payer: Medicaid Other

## 2013-08-07 ENCOUNTER — Other Ambulatory Visit: Payer: Self-pay | Admitting: Obstetrics & Gynecology

## 2013-08-07 DIAGNOSIS — N949 Unspecified condition associated with female genital organs and menstrual cycle: Secondary | ICD-10-CM

## 2013-08-07 DIAGNOSIS — N83209 Unspecified ovarian cyst, unspecified side: Secondary | ICD-10-CM

## 2013-08-19 ENCOUNTER — Ambulatory Visit (INDEPENDENT_AMBULATORY_CARE_PROVIDER_SITE_OTHER): Payer: Medicaid Other | Admitting: Obstetrics & Gynecology

## 2013-08-19 ENCOUNTER — Encounter: Payer: Self-pay | Admitting: Obstetrics & Gynecology

## 2013-08-19 ENCOUNTER — Other Ambulatory Visit: Payer: Self-pay | Admitting: Obstetrics & Gynecology

## 2013-08-19 VITALS — BP 120/60 | Wt 147.0 lb

## 2013-08-19 DIAGNOSIS — N949 Unspecified condition associated with female genital organs and menstrual cycle: Secondary | ICD-10-CM

## 2013-08-19 LAB — POCT URINALYSIS DIPSTICK
GLUCOSE UA: NEGATIVE
Ketones, UA: NEGATIVE
Leukocytes, UA: NEGATIVE
Nitrite, UA: NEGATIVE
Protein, UA: NEGATIVE
RBC UA: NEGATIVE

## 2013-08-19 NOTE — Addendum Note (Signed)
Addended by: Doyne Keel on: 08/19/2013 03:37 PM   Modules accepted: Orders

## 2013-08-19 NOTE — Progress Notes (Signed)
Patient ID: Wendy Hoback, female   DOB: 1960/07/06, 53 y.o.   MRN: [id number] Blood pressure 120/60, weight 147 lb (66.679 kg).  Pt here to review sonogram report  Tacora Athanas is a 53 y.o. [id number] for a pelvic sonogram for ?lt ovarian cyst and pelvic pain. Pt is s/p ablation.  Uterus 5.8 x 4.0 x 2.6 cm, anteverted  Endometrium 2.8 mm, symmetrical, s/p ablation  Right ovary 1.3 x 1.0 x 0.7 cm,  Left ovary 2.2 x 1.3 x 1.1 cm,  No free fluid or adnexal masses noted within the pelvis  *Noted Post-Void Residual Bladder Volume of 79.5cc*  Technician Comments:  Anteverted uterus, Endom-2.58mm, bilateral ovaries appear WNL, no free fluid or adnexal masses noted within the pelvis, *Noted Post-Void Residual Bladder Volume of 79.5ccLazarus Gowda  08/07/2013  1:29 PM  Thin endometrium, s/p ablation considered normal  Normal adnexal structures.  Minimally exaggerated PVR, not a clinical concern at present/  FERGUSON,JOHN V    Pt desires to have her mid urethral sling mesh removed and Dr Luan Pulling is making arrangements for her to see a urologist There is no gyn problems appreciated at present  Will follow up prn

## 2013-09-11 ENCOUNTER — Other Ambulatory Visit: Payer: Medicaid Other

## 2013-09-16 ENCOUNTER — Ambulatory Visit
Admission: RE | Admit: 2013-09-16 | Discharge: 2013-09-16 | Disposition: A | Discharge status: Home / Self Care | Payer: Medicaid Other | Source: Ambulatory Visit | Attending: Nurse Practitioner | Admitting: Nurse Practitioner

## 2013-09-16 DIAGNOSIS — C220 Liver cell carcinoma: Secondary | ICD-10-CM

## 2013-09-22 ENCOUNTER — Telehealth: Payer: Self-pay | Admitting: *Deleted

## 2013-09-22 NOTE — Telephone Encounter (Signed)
Pt called stating she got her labs. Pt states she has stage 1 liver, pt called wanting to know if she can get her hep. A & B shots here. I made pt aware to check with her PCP and some pharmacy's will do those shots.

## 2013-10-06 ENCOUNTER — Telehealth: Payer: Self-pay | Admitting: Internal Medicine

## 2013-10-06 NOTE — Telephone Encounter (Signed)
LMOM that patient has OV with [facility] on 8/28 at 0830 and to please call me back to confirm that she got my message.

## 2013-10-06 NOTE — Telephone Encounter (Signed)
Pt has not been seen for an ov since 11/2012. Please make her an ov. Thanks.

## 2013-10-06 NOTE — Telephone Encounter (Signed)
Pt came in this afternoon to say that she is still having abd pain and her side hurts. She mentioned that she has gall stones and wants them taken out. She said that Dr Luan Pulling told her that until she was doubled over then they would see her. Pt doesn't want to wait until it gets to that point and wants something done now. She is requesting a referral to Dr Arnoldo Morale. Please advise and call her at [phone]. She also said that she needs a 4 day notice to make arrangements with [facility] and she can't do anything on 10/27/2013.

## 2013-10-07 ENCOUNTER — Telehealth: Payer: Self-pay | Admitting: *Deleted

## 2013-10-07 NOTE — Telephone Encounter (Signed)
Pt is aware.  

## 2013-10-07 NOTE — Telephone Encounter (Signed)
Pt called to confirm for appt 10/24/13 at 8:30 with [facility]. Pt is aware and stated she will be here

## 2013-10-22 IMAGING — CR DG CHEST 1V PORT
1 series · 1 of 1 positions shown · non-contrast
Comparison: Single view of the chest 10/29/2011.

CLINICAL DATA: Abdominal pain and fever.

PORTABLE CHEST - 1 VIEW

[view not recorded]
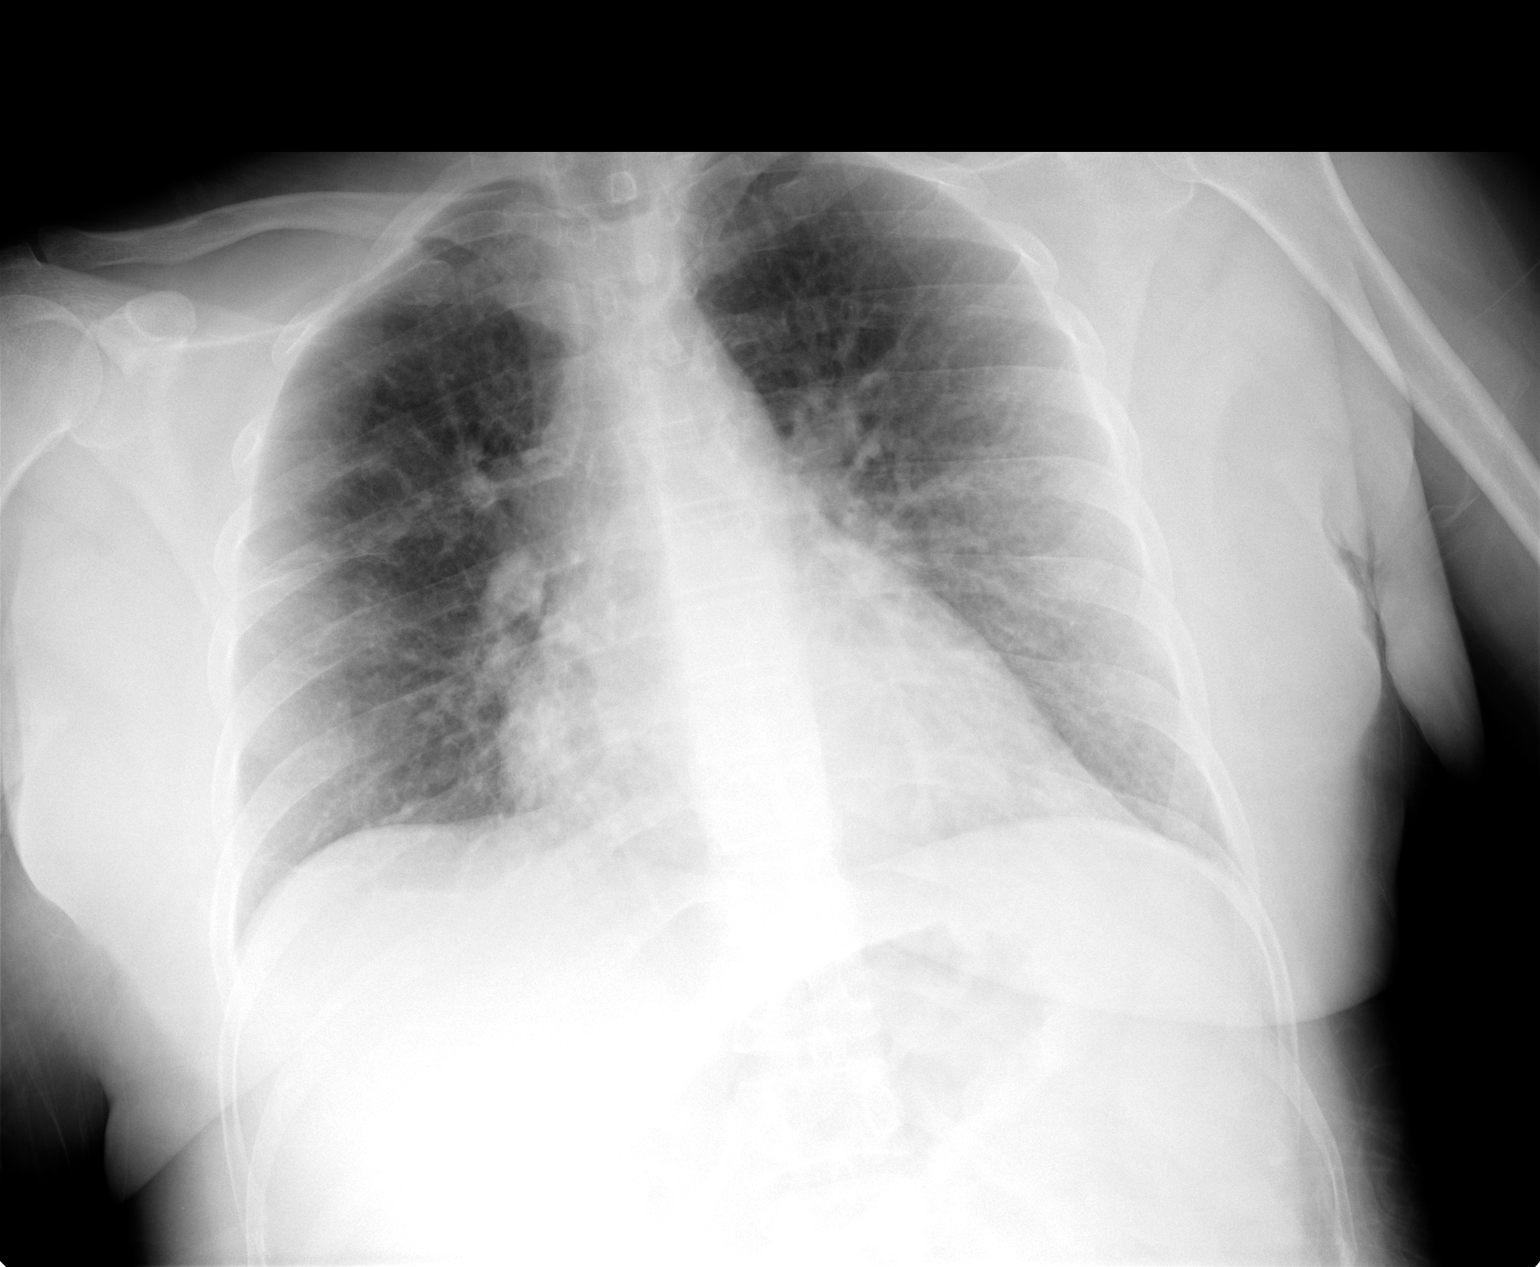

[1 of 1 positions shown; findings below may reference images not displayed]

FINDINGS: Lung volumes are low with some crowding of knee
bronchovascular structures.  No focal airspace disease or effusion.
Heart size upper normal.
IMPRESSION: No acute disease.

## 2013-10-24 ENCOUNTER — Encounter (INDEPENDENT_AMBULATORY_CARE_PROVIDER_SITE_OTHER): Payer: Self-pay

## 2013-10-24 ENCOUNTER — Encounter: Payer: Self-pay | Admitting: Internal Medicine

## 2013-10-24 ENCOUNTER — Ambulatory Visit (INDEPENDENT_AMBULATORY_CARE_PROVIDER_SITE_OTHER): Payer: Medicaid Other | Admitting: Internal Medicine

## 2013-10-24 VITALS — BP 119/78 | HR 84 | Temp 97.4°F | Ht 61.0 in | Wt 140.2 lb

## 2013-10-24 DIAGNOSIS — K2100 Gastro-esophageal reflux disease with esophagitis, without bleeding: Secondary | ICD-10-CM

## 2013-10-24 DIAGNOSIS — R1011 Right upper quadrant pain: Secondary | ICD-10-CM

## 2013-10-24 DIAGNOSIS — K5900 Constipation, unspecified: Secondary | ICD-10-CM

## 2013-10-24 DIAGNOSIS — B182 Chronic viral hepatitis C: Secondary | ICD-10-CM
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
## 2013-10-24 NOTE — Patient Instructions (Signed)
Resume daily fiber   Use linzess daily  HIDA with fatty meal challenge - RUQ abdominal pain and gallstones  Further recommendations to follow

## 2013-10-27 ENCOUNTER — Other Ambulatory Visit: Payer: Self-pay | Admitting: Internal Medicine

## 2013-10-27 DIAGNOSIS — K8020 Calculus of gallbladder without cholecystitis without obstruction: Secondary | ICD-10-CM

## 2013-10-27 DIAGNOSIS — R1011 Right upper quadrant pain: Secondary | ICD-10-CM

## 2013-10-29 ENCOUNTER — Other Ambulatory Visit (HOSPITAL_COMMUNITY): Payer: Medicaid Other

## 2013-10-30 ENCOUNTER — Encounter (HOSPITAL_COMMUNITY)
Admission: RE | Admit: 2013-10-30 | Discharge: 2013-10-30 | Disposition: A | Discharge status: Home / Self Care | Payer: Medicaid Other | Source: Ambulatory Visit | Attending: Diagnostic Radiology | Admitting: Diagnostic Radiology

## 2013-10-30 ENCOUNTER — Encounter (HOSPITAL_COMMUNITY): Payer: Self-pay

## 2013-10-30 DIAGNOSIS — R1011 Right upper quadrant pain: Secondary | ICD-10-CM

## 2013-10-30 DIAGNOSIS — K8020 Calculus of gallbladder without cholecystitis without obstruction: Secondary | ICD-10-CM

## 2013-10-30 MED ORDER — TECHNETIUM TC 99M MEBROFENIN IV KIT
5.0000 | PACK | Freq: Once | INTRAVENOUS | Status: AC | PRN
  Administered 2013-10-30: 5 via INTRAVENOUS

## 2013-10-30 MED ORDER — SODIUM CHLORIDE 0.9 % IJ SOLN
INTRAMUSCULAR | Status: AC
  Filled 2013-10-30: qty 24

## 2013-10-30 MED ORDER — STERILE WATER FOR INJECTION IJ SOLN
INTRAMUSCULAR | Status: AC
  Administered 2013-10-30: 1.27 mL via INTRAVENOUS
  Filled 2013-10-30: qty 10

## 2013-10-30 MED ORDER — SODIUM CHLORIDE 0.9 % IJ SOLN
INTRAMUSCULAR | Status: AC
  Filled 2013-10-30: qty 18

## 2013-10-30 MED ORDER — SINCALIDE 5 MCG IJ SOLR
INTRAMUSCULAR | Status: AC
  Administered 2013-10-30: 1.27 ug via INTRAVENOUS
  Filled 2013-10-30: qty 5

## 2013-11-04 ENCOUNTER — Other Ambulatory Visit: Payer: Self-pay

## 2013-11-04 ENCOUNTER — Telehealth: Payer: Self-pay | Admitting: Internal Medicine

## 2013-11-04 DIAGNOSIS — K8000 Calculus of gallbladder with acute cholecystitis without obstruction: Secondary | ICD-10-CM

## 2013-11-04 NOTE — Telephone Encounter (Signed)
I spoke with the pt.  

## 2013-11-04 NOTE — Telephone Encounter (Signed)
Pt called today to reschedule a friend of hers that was a no show this morning and then asked about her results. I told her that JL would be in later today and I would have her check on that for her. [phone]

## 2013-11-05 NOTE — Telephone Encounter (Signed)
Patient is aware of her appointment with Dr. Arnoldo Morale Oct 1st at 9:30

## 2013-11-06 ENCOUNTER — Encounter: Payer: Self-pay | Admitting: Internal Medicine

## 2013-11-06 ENCOUNTER — Telehealth: Payer: Self-pay | Admitting: Internal Medicine

## 2013-11-06 NOTE — Telephone Encounter (Signed)
LMOM that [name] had already talked to her about the appointment at [facility] office

## 2013-11-06 NOTE — Telephone Encounter (Signed)
PATIENT RETURNED [place].  PLEASE CALL [phone]

## 2013-11-06 NOTE — Telephone Encounter (Signed)
I tried to call with no answer 

## 2013-11-10 ENCOUNTER — Telehealth: Payer: Self-pay | Admitting: Internal Medicine

## 2013-11-10 NOTE — Telephone Encounter (Signed)
I called and spoke with the pt- advised her that we would not be writing her any narcotics and that RMR didn't recommend anything stronger than hydrocodone. Pt stated she understood. She went to Dr.Hawkins today and he gave her a phenergan injection and some antibiotics to help with her dental problems. She said her dentist gave her hydrocodone today.

## 2013-11-10 NOTE — Telephone Encounter (Signed)
PATIENT CAME IN INQUIRING ABOUT GETTING PAIN MEDICATION.  SAID HER DENTIST WOULD NOT GIVE HER ANYTHING STRONGER THAN HYDROCODONE AND SHE NEEDED SOMETHING TO HOLD HER OVER UNTIL HER APPOINTMENT WITH DR. Arnoldo Morale.

## 2013-11-10 NOTE — Telephone Encounter (Signed)
I would not recommend anything stronger than hydrocodone.

## 2013-11-11 NOTE — Telephone Encounter (Signed)
Noted  

## 2013-11-17 ENCOUNTER — Other Ambulatory Visit: Payer: Self-pay | Admitting: Obstetrics & Gynecology

## 2013-11-17 DIAGNOSIS — Z1231 Encounter for screening mammogram for malignant neoplasm of breast: Secondary | ICD-10-CM

## 2013-11-29 ENCOUNTER — Other Ambulatory Visit: Payer: Self-pay | Admitting: Gastroenterology

## 2013-12-03 NOTE — H&P (Signed)
  NTS SOAP Note  Vital Signs:  Vitals as of: 92/05/2681: Systolic 419: Diastolic 622: Heart Rate 297: Temp 98.63F: Height 91ft 3in: Weight 140Lbs 0 Ounces: Pain Level 5: BMI 24.8  BMI : 24.8 kg/m2  Subjective: This 53 year old female presents for of biliary colic.  Has been having episodes of right upper quadrant abdominal pain and bloating after eating.  No fever,  chills,  jaundice.  Does have hep c.   Review of Symptoms:  Constitutional:fatigue headache Eyes:unremarkable   sinus problems Cardiovascular:  unremarkable Respiratory:dyspnea Gastrointestinabdominal pain, nausea, vomiting, heartburn Genitourinary:frequency, urinary hesitancy joint and back pain Skin:unremarkable Hematolgic/Lymphatic:unremarkable   Allergic/Immunologic:unremarkable   Past Medical History:  Reviewed  Past Medical History  Surgical History: transvaginal mesh,  BTL,  nasal surgery Medical Problems: hepatitis C,  bipolar disorder,  anxiety,  memory deficit,  HTN,  GERD Psychiatric History: bipolar disorder Allergies: PCN  Medications: adderall,  xanax,  norvasc,  zyrtec,  flexeril,  norco,  linzess,  prilosec,  zofran,  pyridium,  vesicare,  geodon    Social History:Reviewed  Social History  Preferred Language: English Race:  Other Ethnicity: Not Hispanic / Latino Age: 70 year Marital Status:  S Alcohol: no   Smoking Status: Current every day smoker reviewed on 11/27/2013 Started Date:  Packs per day: 0.50 Functional Status reviewed on 11/27/2013 ------------------------------------------------ Bathing: Normal Cooking: Normal Dressing: Normal Driving: Normal Eating: Normal Managing Meds: Normal Oral Care: Normal Shopping: Normal Toileting: Normal Transferring: Normal Walking: Normal Cognitive Status reviewed on 11/27/2013 ------------------------------------------------ Attention: Normal Decision Making: Normal Language: Normal Memory: Normal Motor:  Normal Perception: Normal Problem Solving: Normal Visual and Spatial: Normal   Family History:Reviewed  Family Health History Mother  Father, Living; Prostate cancer;     Objective Information: General:Well appearing, well nourished in no distress. no scleral icterus Heart:RRR, no murmur or gallop.  Normal S1, S2.  No S3, S4.  Lungs:  CTA bilaterally, no wheezes, rhonchi, rales.  Breathing unlabored. Abdomen:Soft, slight tenderness in right upper quadrant to deep palpation,  ND, no HSM, no masses. U/S of gallbadder:  cholelithaisis HIDA: wnl  Assessment:Biliary colic,  cholelithiasis,  hepatitis c  Diagnoses: 574.20  K80.20 Gallstone (Calculus of gallbladder without cholecystitis without obstruction)  Procedures: 98921 - OFFICE OUTPATIENT NEW 30 MINUTES    Plan:  Scheduled for laparoscopic cholecystectomy,  liver biopsy on 12/31/13.    Patient Education:Alternative treatments to surgery were discussed with patient (and family).  Risks and benefits  of procedure including bleeding,  infection,  hepatobiliary injury,  and the possibility of an open procedure were fully explained to the patient (and family) who gave informed consent. Patient/family questions were addressed.  Follow-up:Pending Surgery

## 2013-12-15 ENCOUNTER — Ambulatory Visit (HOSPITAL_COMMUNITY): Payer: Medicaid Other

## 2013-12-16 ENCOUNTER — Encounter (HOSPITAL_COMMUNITY): Payer: Self-pay | Admitting: Pharmacy Technician

## 2013-12-23 ENCOUNTER — Other Ambulatory Visit: Payer: Self-pay | Admitting: Obstetrics & Gynecology

## 2013-12-23 NOTE — Patient Instructions (Signed)
[name]  12/23/2013   Your procedure is scheduled on:  01/02/2014  Report to [facility] at 6:15 AM.  Call this number if you have problems the morning of surgery: [phone]   Remember:   Do not eat food or drink liquids after midnight.   Take these medicines the morning of surgery with A SIP OF WATER:     PROVERA,XANAX,AMLODIPINE, ADDERALL, ZYRTEC, FLEXERIL, ESTRACE,  HYDROCODONE, LINZESS, PRILOSEC, PHENERGAN, VESICARE, TRIMPEX, GEODON   Do not wear jewelry, make-up or nail polish.  Do not wear lotions, powders, or perfumes. You may wear deodorant.  Do not shave 48 hours prior to surgery. Men may shave face and neck.  Do not bring valuables to the hospital.  [facility] is not responsible for any belongings or valuables.               Contacts, dentures or bridgework may not be worn into surgery.  Leave suitcase in the car. After surgery it may be brought to your room.  For patients admitted to the hospital, discharge time is determined by your treatment team.               Patients discharged the day of surgery will not be allowed to drive home.  Name and phone number of your driver:   Special Instructions: Shower using CHG 2 nights before surgery and the night before surgery.  If you shower the day of surgery use CHG.  Use special wash - you have one bottle of CHG for all showers.  You should use approximately 1/3 of the bottle for each shower.   Please read over the following fact sheets that you were given: Surgical Site Infection Prevention and Anesthesia Post-op Instructions   PATIENT INSTRUCTIONS POST-ANESTHESIA  IMMEDIATELY FOLLOWING SURGERY:  Do not drive or operate machinery for the first twenty four hours after surgery.  Do not make any important decisions for twenty four hours after surgery or while taking narcotic pain medications or sedatives.  If you develop intractable nausea and vomiting or a severe headache please notify your doctor  immediately.  FOLLOW-UP:  Please make an appointment with your surgeon as instructed. You do not need to follow up with anesthesia unless specifically instructed to do so.  WOUND CARE INSTRUCTIONS (if applicable):  Keep a dry clean dressing on the anesthesia/puncture wound site if there is drainage.  Once the wound has quit draining you may leave it open to air.  Generally you should leave the bandage intact for twenty four hours unless there is drainage.  If the epidural site drains for more than 36-48 hours please call the anesthesia department.  QUESTIONS?:  Please feel free to call your physician or the hospital operator if you have any questions, and they will be happy to assist you.      Liver Biopsy The liver is a large organ in the upper right-hand side of your abdomen. A liver biopsy is a procedure in which a tissue sample is taken from the liver and examined under a microscope. The procedure is done to confirm a suspected problem. There are three types of liver biopsies:  Percutaneous. In this type, an incision is made in your abdomen. The sample is removed through the incision with a needle.  Laparoscopic. In this type, several incisions are made in the abdomen. A tiny camera is passed through one of the incisions to help guide the health care provider. The sample is removed through the other incision or  incisions.  Transjugular. In this type, an incision is made in the neck. A tube is passed through the incision to the liver. The sample is removed through the tube with a needle. LET [facility] CARE PROVIDER KNOW ABOUT:  Any allergies you have.  All medicines you are taking, including vitamins, herbs, eye drops, creams, and over-the-counter medicines.  Previous problems you or members of your family have had with the use of anesthetics.  Any blood disorders you have.  Previous surgeries you have had.  Medical conditions you have.  Possibility of pregnancy, if this  applies. RISKS AND COMPLICATIONS Generally, this is a safe procedure. However, problems can occur and include:  Bleeding.  Infection.  Bruising.  Collapsed lung.  Leak of digestive juices (bile) from the liver or gallbladder.  Problems with heart rhythm.  Pain at the biopsy site or in the right shoulder.  Low blood pressure (hypotension).  Injury to nearby organs or tissues. BEFORE THE PROCEDURE  Your health care provider may do some blood or urine tests. These will help your health care provider learn how well your kidneys and liver are working and how well your blood clots.  Ask your health care provider if you will be able to go home the day of the procedure. Arrange for someone to take you home and stay with you for at least 24 hours.  Do not eat or drink anything after midnight on the night before the procedure or as directed by your health care provider.  Ask your health care provider about:  Changing or stopping your regular medicines. This is especially important if you are taking diabetes medicines or blood thinners.  Taking medicines such as aspirin and ibuprofen. These medicines can thin your blood. Do not take these medicines before your procedure if your health care provider asks you not to. PROCEDURE Regardless of the type of biopsy that will be done, you will have an IV line placed. Through this line, you will receive fluids and medicine to relax you. If you will be having a laparoscopic biopsy, you may also receive medicine through this line to make you sleep during the procedure (general anesthetic). Percutaneous Liver Biopsy  You will positioned on your back, with your right hand over your head.  A health care provider will locate your liver by tapping and pressing on the right side of your abdomen or with the help of an ultrasound machine or CT scan.  An area at the bottom of your last right rib will be numbed.  An incision will be made in the numbed  area.  The biopsy needle will be inserted into the incision.  Several samples of liver tissue will be taken with the biopsy needle. You will be asked to hold your breath as each sample is taken. Laparoscopic Liver Biopsy  You will be positioned on your back.  Several small incisions will be made in your abdomen.  Your doctor will pass a tiny camera through one incision. The camera will allow the liver to be viewed on a TV monitor in the operating room.  Tools will be passed through the other incision or incisions. These tools will be used to remove samples of liver tissue. Transjugular Liver Biopsy  You will be positioned on your back on an X-ray table, with your head turned to your left.  An area on your neck just over your jugular vein will be numbed.  An incision will be made in the numbed area.  A  tiny tube will be inserted through the incision. It will be pushed through the jugular vein to a blood vessel in the liver called the hepatic vein.  Dye will be inserted through the tube, and X-rays will be taken. The dye will make the blood vessels in the liver light up on the X-rays.  The biopsy needle will be pushed through the tube until it reaches the liver.  Samples of liver tissue will be taken with the biopsy needle.  The needle and the tube will be removed. After the samples are obtained, the incision or incisions will be closed. AFTER THE PROCEDURE  You will be taken to a recovery area.  You may have to lie on your right side for 1-2 hours. This will prevent bleeding from the biopsy site.  Your progress will be watched. Your blood pressure, pulse, and the biopsy site will be checked often.  You may have some pain or feel sick. If this happens, tell your health care provider.  As you begin to feel better, you will be offered ice and beverages.  You may be allowed to go home when the medicines have worn off and you can walk, drink, eat, and use the bathroom. Document  Released: 05/06/2003 Document Revised: 06/30/2013 Document Reviewed: 04/11/2013 [facility] Patient Information 2015 [place], [place]. This information is not intended to replace advice given to you by your health care provider. Make sure you discuss any questions you have with your health care provider. Laparoscopic Cholecystectomy Laparoscopic cholecystectomy is surgery to remove the gallbladder. The gallbladder is located in the upper right part of the abdomen, behind the liver. It is a storage sac for bile produced in the liver. Bile aids in the digestion and absorption of fats. Cholecystectomy is often done for inflammation of the gallbladder (cholecystitis). This condition is usually caused by a buildup of gallstones (cholelithiasis) in your gallbladder. Gallstones can block the flow of bile, resulting in inflammation and pain. In severe cases, emergency surgery may be required. When emergency surgery is not required, you will have time to prepare for the procedure. Laparoscopic surgery is an alternative to open surgery. Laparoscopic surgery has a shorter recovery time. Your common bile duct may also need to be examined during the procedure. If stones are found in the common bile duct, they may be removed. LET [facility] CARE PROVIDER KNOW ABOUT:  Any allergies you have.  All medicines you are taking, including vitamins, herbs, eye drops, creams, and over-the-counter medicines.  Previous problems you or members of your family have had with the use of anesthetics.  Any blood disorders you have.  Previous surgeries you have had.  Medical conditions you have. RISKS AND COMPLICATIONS Generally, this is a safe procedure. However, as with any procedure, complications can occur. Possible complications include:  Infection.  Damage to the common bile duct, nerves, arteries, veins, or other internal organs such as the stomach, liver, or intestines.  Bleeding.  A stone may remain in the common  bile duct.  A bile leak from the cyst duct that is clipped when your gallbladder is removed.  The need to convert to open surgery, which requires a larger incision in the abdomen. This may be necessary if your surgeon thinks it is not safe to continue with a laparoscopic procedure. BEFORE THE PROCEDURE  Ask your health care provider about changing or stopping any regular medicines. You will need to stop taking aspirin or blood thinners at least 5 days prior to surgery.  Do  not eat or drink anything after midnight the night before surgery.  Let your health care provider know if you develop a cold or other infectious problem before surgery. PROCEDURE   You will be given medicine to make you sleep through the procedure (general anesthetic). A breathing tube will be placed in your mouth.  When you are asleep, your surgeon will make several small cuts (incisions) in your abdomen.  A thin, lighted tube with a tiny camera on the end (laparoscope) is inserted through one of the small incisions. The camera on the laparoscope sends a picture to a TV screen in the operating room. This gives the surgeon a good view inside your abdomen.  A gas will be pumped into your abdomen. This expands your abdomen so that the surgeon has more room to perform the surgery.  Other tools needed for the procedure are inserted through the other incisions. The gallbladder is removed through one of the incisions.  After the removal of your gallbladder, the incisions will be closed with stitches, staples, or skin glue. AFTER THE PROCEDURE  You will be taken to a recovery area where your progress will be checked often.  You may be allowed to go home the same day if your pain is controlled and you can tolerate liquids. Document Released: 02/13/2005 Document Revised: 12/04/2012 Document Reviewed: 09/25/2012 [facility] Patient Information 2015 [place], [place]. This information is not intended to replace advice given to you  by your health care provider. Make sure you discuss any questions you have with your health care provider.

## 2013-12-24 ENCOUNTER — Encounter (HOSPITAL_COMMUNITY)
Admission: RE | Admit: 2013-12-24 | Discharge: 2013-12-24 | Disposition: A | Discharge status: Home / Self Care | Payer: Medicaid Other | Source: Ambulatory Visit | Attending: General Surgery | Admitting: General Surgery

## 2013-12-24 ENCOUNTER — Ambulatory Visit (HOSPITAL_COMMUNITY)
Admission: RE | Admit: 2013-12-24 | Discharge: 2013-12-24 | Disposition: A | Discharge status: Home / Self Care | Payer: Medicaid Other | Source: Ambulatory Visit | Attending: Obstetrics & Gynecology | Admitting: Obstetrics & Gynecology

## 2013-12-24 ENCOUNTER — Encounter (HOSPITAL_COMMUNITY): Payer: Self-pay

## 2013-12-24 DIAGNOSIS — Z1231 Encounter for screening mammogram for malignant neoplasm of breast: Secondary | ICD-10-CM | POA: Insufficient documentation

## 2013-12-24 DIAGNOSIS — Z01812 Encounter for preprocedural laboratory examination: Secondary | ICD-10-CM | POA: Insufficient documentation

## 2013-12-24 LAB — CBC WITH DIFFERENTIAL/PLATELET
BASOS PCT: 0 % (ref 0–1)
Basophils Absolute: 0 10*3/uL (ref 0.0–0.1)
EOS ABS: 0 10*3/uL (ref 0.0–0.7)
Eosinophils Relative: 0 % (ref 0–5)
HEMATOCRIT: 37.9 % (ref 36.0–46.0)
HEMOGLOBIN: 13.1 g/dL (ref 12.0–15.0)
Lymphocytes Relative: 18 % (ref 12–46)
Lymphs Abs: 1.6 10*3/uL (ref 0.7–4.0)
MCH: 30.5 pg (ref 26.0–34.0)
MCHC: 34.6 g/dL (ref 30.0–36.0)
MCV: 88.1 fL (ref 78.0–100.0)
MONOS PCT: 4 % (ref 3–12)
Monocytes Absolute: 0.3 10*3/uL (ref 0.1–1.0)
Neutro Abs: 7 10*3/uL (ref 1.7–7.7)
Neutrophils Relative %: 78 % — ABNORMAL HIGH (ref 43–77)
Platelets: 229 10*3/uL (ref 150–400)
RBC: 4.3 MIL/uL (ref 3.87–5.11)
RDW: 13.2 % (ref 11.5–15.5)
WBC: 9 10*3/uL (ref 4.0–10.5)

## 2013-12-24 LAB — BASIC METABOLIC PANEL
Anion gap: 13 (ref 5–15)
BUN: 14 mg/dL (ref 6–23)
CHLORIDE: 100 meq/L (ref 96–112)
CO2: 24 meq/L (ref 19–32)
Calcium: 9.2 mg/dL (ref 8.4–10.5)
Creatinine, Ser: 0.83 mg/dL (ref 0.50–1.10)
GFR calc Af Amer: >90 mL/min (ref >90)
GFR calc non Af Amer: 80 mL/min — ABNORMAL LOW (ref >90)
Glucose, Bld: 125 mg/dL — ABNORMAL HIGH (ref 70–99)
Potassium: 4.2 mEq/L (ref 3.7–5.3)
Sodium: 137 mEq/L (ref 137–147)

## 2013-12-24 LAB — HEPATIC FUNCTION PANEL
ALT: 18 U/L (ref 0–35)
AST: 23 U/L (ref 0–37)
Albumin: 3.9 g/dL (ref 3.5–5.2)
Alkaline Phosphatase: 65 U/L (ref 39–117)
Bilirubin, Direct: <0.2 mg/dL (ref 0.0–0.3)
TOTAL PROTEIN: 7 g/dL (ref 6.0–8.3)
Total Bilirubin: 0.5 mg/dL (ref 0.3–1.2)

## 2013-12-24 LAB — SURGICAL PCR SCREEN
MRSA, PCR: NEGATIVE
STAPHYLOCOCCUS AUREUS: NEGATIVE

## 2013-12-24 NOTE — Pre-Procedure Instructions (Signed)
PAtient given information to sign up for my chart at home.

## 2013-12-25 ENCOUNTER — Telehealth: Payer: Self-pay | Admitting: Internal Medicine

## 2013-12-25 NOTE — Telephone Encounter (Signed)
Hep C genotype was done on 04/27/11 and she is a 1a Hep C RNA Quantitative was done on 12/15/2010 and was 31,[phone].  Does she need to have these repeated now?

## 2013-12-25 NOTE — Telephone Encounter (Signed)
Good to know she's having a liver biopsy. She needs a hep C genotype and viral load if not already done

## 2013-12-25 NOTE — Telephone Encounter (Signed)
Patient states that as of November 30th she will be going away for 2 years and 7 months.  If anything needs to be done with her blood work regarding her hep-c please advise.

## 2013-12-25 NOTE — Telephone Encounter (Signed)
Dr. [name], pt was on your schedule to see you tomorrow. She just saw you in August and had a HIDA scan done and is going to have her gallbladder removed and a liver bx next week by Dr.Jenkins. Erline Levine called the pt this morning and she said she wasn't having any problems and didn't feel like she needed to see you tomorrow but she wants to know if she needs to have any bloodwork soon. Apparently she is not going to be available for 2 years and 7 months.

## 2013-12-26 ENCOUNTER — Ambulatory Visit: Payer: Medicaid Other | Admitting: Internal Medicine

## 2013-12-29 ENCOUNTER — Encounter (HOSPITAL_COMMUNITY): Payer: Self-pay

## 2014-01-02 ENCOUNTER — Encounter (HOSPITAL_COMMUNITY)
Admission: RE | Disposition: A | Discharge status: Home / Self Care | Payer: Self-pay | Source: Ambulatory Visit | Attending: General Surgery

## 2014-01-02 ENCOUNTER — Ambulatory Visit (HOSPITAL_COMMUNITY): Payer: Medicaid Other | Admitting: Anesthesiology

## 2014-01-02 ENCOUNTER — Ambulatory Visit (HOSPITAL_COMMUNITY)
Admission: RE | Admit: 2014-01-02 | Discharge: 2014-01-02 | Disposition: A | Discharge status: Home / Self Care | Payer: Medicaid Other | Source: Ambulatory Visit | Attending: General Surgery | Admitting: General Surgery

## 2014-01-02 DIAGNOSIS — K8010 Calculus of gallbladder with chronic cholecystitis without obstruction: Secondary | ICD-10-CM | POA: Diagnosis present

## 2014-01-02 DIAGNOSIS — K739 Chronic hepatitis, unspecified: Secondary | ICD-10-CM | POA: Insufficient documentation

## 2014-01-02 DIAGNOSIS — K7460 Unspecified cirrhosis of liver: Secondary | ICD-10-CM | POA: Diagnosis not present

## 2014-01-02 HISTORY — PX: CHOLECYSTECTOMY: SHX55

## 2014-01-02 HISTORY — PX: LIVER BIOPSY: SHX301

## 2014-01-02 SURGERY — LAPAROSCOPIC CHOLECYSTECTOMY
Anesthesia: General | Site: Abdomen

## 2014-01-02 MED ORDER — ROCURONIUM BROMIDE 50 MG/5ML IV SOLN
INTRAVENOUS | Status: AC
  Filled 2014-01-02: qty 1

## 2014-01-02 MED ORDER — POVIDONE-IODINE 10 % EX OINT
TOPICAL_OINTMENT | CUTANEOUS | Status: AC
  Filled 2014-01-02: qty 1

## 2014-01-02 MED ORDER — BUPIVACAINE HCL (PF) 0.5 % IJ SOLN
INTRAMUSCULAR | Status: AC
  Filled 2014-01-02: qty 30

## 2014-01-02 MED ORDER — ONDANSETRON HCL 4 MG/2ML IJ SOLN
INTRAMUSCULAR | Status: AC
  Filled 2014-01-02: qty 2

## 2014-01-02 MED ORDER — DEXAMETHASONE SODIUM PHOSPHATE 4 MG/ML IJ SOLN
INTRAMUSCULAR | Status: AC
  Filled 2014-01-02: qty 1

## 2014-01-02 MED ORDER — FENTANYL CITRATE 0.05 MG/ML IJ SOLN
INTRAMUSCULAR | Status: AC
  Filled 2014-01-02: qty 5

## 2014-01-02 MED ORDER — HYDROMORPHONE HCL 1 MG/ML IJ SOLN
0.2500 mg | Freq: Once | INTRAMUSCULAR | Status: AC
  Administered 2014-01-02: 0.25 mg via INTRAVENOUS

## 2014-01-02 MED ORDER — NEOSTIGMINE METHYLSULFATE 10 MG/10ML IV SOLN
INTRAVENOUS | Status: DC | PRN
  Administered 2014-01-02: 4 mg via INTRAVENOUS

## 2014-01-02 MED ORDER — SUCCINYLCHOLINE CHLORIDE 20 MG/ML IJ SOLN
INTRAMUSCULAR | Status: AC
  Filled 2014-01-02: qty 1

## 2014-01-02 MED ORDER — BUPIVACAINE HCL (PF) 0.5 % IJ SOLN
INTRAMUSCULAR | Status: DC | PRN
  Administered 2014-01-02: 10 mL

## 2014-01-02 MED ORDER — GLYCOPYRROLATE 0.2 MG/ML IJ SOLN
INTRAMUSCULAR | Status: DC | PRN
  Administered 2014-01-02: 0.6 mg via INTRAVENOUS

## 2014-01-02 MED ORDER — CIPROFLOXACIN IN D5W 400 MG/200ML IV SOLN
INTRAVENOUS | Status: AC
  Filled 2014-01-02: qty 200

## 2014-01-02 MED ORDER — POVIDONE-IODINE 10 % OINT PACKET
TOPICAL_OINTMENT | CUTANEOUS | Status: DC | PRN
  Administered 2014-01-02: 1 via TOPICAL

## 2014-01-02 MED ORDER — LIDOCAINE HCL (PF) 1 % IJ SOLN
INTRAMUSCULAR | Status: AC
  Filled 2014-01-02: qty 5

## 2014-01-02 MED ORDER — SUCCINYLCHOLINE CHLORIDE 20 MG/ML IJ SOLN
INTRAMUSCULAR | Status: DC | PRN
  Administered 2014-01-02: 100 mg via INTRAVENOUS

## 2014-01-02 MED ORDER — PROPOFOL 10 MG/ML IV EMUL
INTRAVENOUS | Status: AC
  Filled 2014-01-02: qty 20

## 2014-01-02 MED ORDER — PROPOFOL 10 MG/ML IV BOLUS
INTRAVENOUS | Status: DC | PRN
  Administered 2014-01-02: 150 mg via INTRAVENOUS

## 2014-01-02 MED ORDER — FENTANYL CITRATE 0.05 MG/ML IJ SOLN
INTRAMUSCULAR | Status: AC
  Filled 2014-01-02: qty 2

## 2014-01-02 MED ORDER — ONDANSETRON HCL 4 MG/2ML IJ SOLN
4.0000 mg | Freq: Once | INTRAMUSCULAR | Status: AC
  Administered 2014-01-02: 4 mg via INTRAVENOUS

## 2014-01-02 MED ORDER — ROCURONIUM BROMIDE 100 MG/10ML IV SOLN
INTRAVENOUS | Status: DC | PRN
  Administered 2014-01-02: 30 mg via INTRAVENOUS

## 2014-01-02 MED ORDER — ONDANSETRON HCL 4 MG/2ML IJ SOLN: 4.0000 mg | Freq: Once | INTRAMUSCULAR | Status: DC | PRN

## 2014-01-02 MED ORDER — NEOSTIGMINE METHYLSULFATE 10 MG/10ML IV SOLN
INTRAVENOUS | Status: AC
  Filled 2014-01-02: qty 1

## 2014-01-02 MED ORDER — MIDAZOLAM HCL 2 MG/2ML IJ SOLN
INTRAMUSCULAR | Status: AC
  Filled 2014-01-02: qty 2

## 2014-01-02 MED ORDER — FENTANYL CITRATE 0.05 MG/ML IJ SOLN
INTRAMUSCULAR | Status: DC | PRN
  Administered 2014-01-02 (×3): 50 ug via INTRAVENOUS
  Administered 2014-01-02: 100 ug via INTRAVENOUS

## 2014-01-02 MED ORDER — KETOROLAC TROMETHAMINE 30 MG/ML IJ SOLN
INTRAMUSCULAR | Status: AC
  Filled 2014-01-02: qty 1

## 2014-01-02 MED ORDER — LACTATED RINGERS IV SOLN
INTRAVENOUS | Status: DC
  Administered 2014-01-02 (×2): via INTRAVENOUS

## 2014-01-02 MED ORDER — HYDROMORPHONE HCL 1 MG/ML IJ SOLN
INTRAMUSCULAR | Status: AC
  Filled 2014-01-02: qty 1

## 2014-01-02 MED ORDER — OXYCODONE-ACETAMINOPHEN 7.5-325 MG PO TABS: 1.0000 | ORAL_TABLET | ORAL | Status: DC | PRN

## 2014-01-02 MED ORDER — GLYCOPYRROLATE 0.2 MG/ML IJ SOLN
INTRAMUSCULAR | Status: AC
  Filled 2014-01-02: qty 3

## 2014-01-02 MED ORDER — MIDAZOLAM HCL 2 MG/2ML IJ SOLN
1.0000 mg | INTRAMUSCULAR | Status: DC | PRN
  Administered 2014-01-02: 2 mg via INTRAVENOUS

## 2014-01-02 MED ORDER — SODIUM CHLORIDE 0.9 % IR SOLN
Status: DC | PRN
  Administered 2014-01-02: 1000 mL

## 2014-01-02 MED ORDER — DEXAMETHASONE SODIUM PHOSPHATE 4 MG/ML IJ SOLN
4.0000 mg | Freq: Once | INTRAMUSCULAR | Status: AC
  Administered 2014-01-02: 4 mg via INTRAVENOUS

## 2014-01-02 MED ORDER — KETOROLAC TROMETHAMINE 30 MG/ML IJ SOLN
30.0000 mg | Freq: Once | INTRAMUSCULAR | Status: AC
  Administered 2014-01-02: 30 mg via INTRAVENOUS

## 2014-01-02 MED ORDER — CHLORHEXIDINE GLUCONATE 4 % EX LIQD: 1.0000 "application " | Freq: Once | CUTANEOUS | Status: DC

## 2014-01-02 MED ORDER — HYDROCODONE-ACETAMINOPHEN 10-325 MG PO TABS: 1.0000 | ORAL_TABLET | ORAL | Status: DC | PRN

## 2014-01-02 MED ORDER — HEMOSTATIC AGENTS (NO CHARGE) OPTIME
TOPICAL | Status: DC | PRN
  Administered 2014-01-02: 1 via TOPICAL

## 2014-01-02 MED ORDER — HYDROMORPHONE HCL 1 MG/ML IJ SOLN
0.2500 mg | INTRAMUSCULAR | Status: DC
  Administered 2014-01-02 (×7): 0.25 mg via INTRAVENOUS
  Filled 2014-01-02: qty 1

## 2014-01-02 MED ORDER — FENTANYL CITRATE 0.05 MG/ML IJ SOLN
25.0000 ug | INTRAMUSCULAR | Status: DC | PRN
  Administered 2014-01-02 (×4): 50 ug via INTRAVENOUS
  Filled 2014-01-02 (×2): qty 2

## 2014-01-02 MED ORDER — CIPROFLOXACIN IN D5W 400 MG/200ML IV SOLN
400.0000 mg | INTRAVENOUS | Status: AC
  Administered 2014-01-02: 400 mg via INTRAVENOUS

## 2014-01-02 SURGICAL SUPPLY — 50 items
APPLIER CLIP LAPSCP 10X32 DD (CLIP) ×3 IMPLANT
BAG HAMPER (MISCELLANEOUS) ×3 IMPLANT
BAG SPEC RTRVL LRG 6X4 10 (ENDOMECHANICALS) ×1
CHLORAPREP W/TINT 26ML (MISCELLANEOUS) ×3 IMPLANT
CLOSURE WOUND 1/4 X3 (GAUZE/BANDAGES/DRESSINGS) ×1
CLOTH BEACON ORANGE TIMEOUT ST (SAFETY) ×3 IMPLANT
COVER LIGHT HANDLE STERIS (MISCELLANEOUS) ×6 IMPLANT
DECANTER SPIKE VIAL GLASS SM (MISCELLANEOUS) ×3 IMPLANT
DRSG TELFA 3X8 NADH (GAUZE/BANDAGES/DRESSINGS) ×3 IMPLANT
ELECT REM PT RETURN 9FT ADLT (ELECTROSURGICAL) ×3
ELECTRODE REM PT RTRN 9FT ADLT (ELECTROSURGICAL) ×1 IMPLANT
FILTER SMOKE EVAC LAPAROSHD (FILTER) ×3 IMPLANT
FORMALIN 10 PREFIL 120ML (MISCELLANEOUS) ×3 IMPLANT
GLOVE BIOGEL PI IND STRL 7.5 (GLOVE) ×2 IMPLANT
GLOVE BIOGEL PI INDICATOR 7.5 (GLOVE) ×4
GLOVE ECLIPSE 6.5 STRL STRAW (GLOVE) ×3 IMPLANT
GLOVE ECLIPSE 7.0 STRL STRAW (GLOVE) ×3 IMPLANT
GLOVE EXAM NITRILE PF LG BLUE (GLOVE) ×3 IMPLANT
GLOVE SURG SS PI 7.5 STRL IVOR (GLOVE) ×6 IMPLANT
GOWN STRL REUS W/ TWL XL LVL3 (GOWN DISPOSABLE) ×1 IMPLANT
GOWN STRL REUS W/TWL LRG LVL3 (GOWN DISPOSABLE) ×12 IMPLANT
GOWN STRL REUS W/TWL XL LVL3 (GOWN DISPOSABLE) ×2
HEMOSTAT SNOW SURGICEL 2X4 (HEMOSTASIS) ×3 IMPLANT
INST SET LAPROSCOPIC AP (KITS) ×3 IMPLANT
KIT ROOM TURNOVER APOR (KITS) ×3 IMPLANT
MANIFOLD NEPTUNE II (INSTRUMENTS) ×3 IMPLANT
NEEDLE BIOPSY 14GX4.5 SOFT TIS (NEEDLE) ×3 IMPLANT
NEEDLE HYPO 25X1 1.5 SAFETY (NEEDLE) ×3 IMPLANT
NEEDLE INSUFFLATION 14GA 120MM (NEEDLE) ×3 IMPLANT
NS IRRIG 1000ML POUR BTL (IV SOLUTION) ×3 IMPLANT
PACK LAP CHOLE LZT030E (CUSTOM PROCEDURE TRAY) ×3 IMPLANT
PACK MINOR (CUSTOM PROCEDURE TRAY) ×3 IMPLANT
PAD ARMBOARD 7.5X6 YLW CONV (MISCELLANEOUS) ×3 IMPLANT
POUCH SPECIMEN RETRIEVAL 10MM (ENDOMECHANICALS) ×3 IMPLANT
SET BASIN LINEN APH (SET/KITS/TRAYS/PACK) ×3 IMPLANT
SLEEVE ENDOPATH XCEL 5M (ENDOMECHANICALS) ×3 IMPLANT
SPONGE GAUZE 2X2 8PLY STER LF (GAUZE/BANDAGES/DRESSINGS) ×4
SPONGE GAUZE 2X2 8PLY STRL LF (GAUZE/BANDAGES/DRESSINGS) ×8 IMPLANT
STAPLER VISISTAT (STAPLE) ×3 IMPLANT
STRIP CLOSURE SKIN 1/4X3 (GAUZE/BANDAGES/DRESSINGS) ×2 IMPLANT
SUT VIC AB 4-0 PS2 27 (SUTURE) ×3 IMPLANT
SUT VICRYL 0 UR6 27IN ABS (SUTURE) ×3 IMPLANT
SYRINGE CONTROL L 12CC (SYRINGE) ×3 IMPLANT
TAPE CLOTH SURG 4X10 WHT LF (GAUZE/BANDAGES/DRESSINGS) ×3 IMPLANT
TROCAR ENDO BLADELESS 11MM (ENDOMECHANICALS) ×3 IMPLANT
TROCAR XCEL NON-BLD 5MMX100MML (ENDOMECHANICALS) ×3 IMPLANT
TROCAR XCEL UNIV SLVE 11M 100M (ENDOMECHANICALS) ×3 IMPLANT
TUBING INSUFFLATION (TUBING) ×3 IMPLANT
WARMER LAPAROSCOPE (MISCELLANEOUS) ×3 IMPLANT
YANKAUER SUCT 12FT TUBE ARGYLE (SUCTIONS) ×3 IMPLANT

## 2014-01-02 NOTE — Interval H&P Note (Signed)
History and Physical Interval Note:  01/02/2014 7:18 AM  [name]  has presented today for surgery, with the diagnosis of cholelithiasis, cirrhosis  The various methods of treatment have been discussed with the patient and family. After consideration of risks, benefits and other options for treatment, the patient has consented to  Procedure(s): LAPAROSCOPIC CHOLECYSTECTOMY (N/A) LIVER BIOPSY (N/A) as a surgical intervention .  The patient's history has been reviewed, patient examined, no change in status, stable for surgery.  I have reviewed the patient's chart and labs.  Questions were answered to the patient's satisfaction.     Aviva Signs A

## 2014-01-02 NOTE — Anesthesia Postprocedure Evaluation (Signed)
  Anesthesia Post-op Note  Patient: [name]  Procedure(s) Performed: Procedure(s): LAPAROSCOPIC CHOLECYSTECTOMY (N/A) LIVER BIOPSY (N/A)  Patient Location: PACU  Anesthesia Type:General  Level of Consciousness: awake, alert  and oriented  Airway and Oxygen Therapy: Patient Spontanous Breathing  Post-op Pain: mild  Post-op Assessment: Post-op Vital signs reviewed, Patient's Cardiovascular Status Stable, Respiratory Function Stable, Patent Airway and No signs of Nausea or vomiting  Post-op Vital Signs: Reviewed and stable  Last Vitals:  Filed Vitals:   01/02/14 1052  BP: 123/72  Pulse: 88  Temp: 36.7 C  Resp: 20    Complications: No apparent anesthesia complications

## 2014-01-02 NOTE — Discharge Instructions (Signed)

## 2014-01-02 NOTE — Anesthesia Preprocedure Evaluation (Signed)
Anesthesia Evaluation  Patient identified by MRN, date of birth, ID band Patient awake    Reviewed: Allergy & Precautions, H&P , NPO status , Patient's Chart, lab work & pertinent test results  Airway Mallampati: II       Dental  (+) Teeth Intact   Pulmonary shortness of breath and with exertion, Current Smoker,  breath sounds clear to auscultation        Cardiovascular hypertension, Pt. on medications - anginaRhythm:Regular     Neuro/Psych  Headaches, PSYCHIATRIC DISORDERS Anxiety Bipolar Disorder  Neuromuscular disease    GI/Hepatic hiatal hernia, GERD-  Medicated,(+) Hepatitis -, C  Endo/Other    Renal/GU      Musculoskeletal  (+) Arthritis -,   Abdominal   Peds  Hematology  (+) anemia ,   Anesthesia Other Findings   Reproductive/Obstetrics                             Anesthesia Physical Anesthesia Plan  ASA: II  Anesthesia Plan: General   Post-op Pain Management:    Induction: Intravenous, Rapid sequence and Cricoid pressure planned  Airway Management Planned: Oral ETT  Additional Equipment:   Intra-op Plan:   Post-operative Plan: Extubation in OR  Informed Consent: I have reviewed the patients History and Physical, chart, labs and discussed the procedure including the risks, benefits and alternatives for the proposed anesthesia with the patient or authorized representative who has indicated his/her understanding and acceptance.     Plan Discussed with:   Anesthesia Plan Comments:         Anesthesia Quick Evaluation

## 2014-01-02 NOTE — Transfer of Care (Signed)
Immediate Anesthesia Transfer of Care Note  Patient: [name]  Procedure(s) Performed: Procedure(s): LAPAROSCOPIC CHOLECYSTECTOMY (N/A) LIVER BIOPSY (N/A)  Patient Location: PACU  Anesthesia Type:General  Level of Consciousness: awake, alert  and oriented  Airway & Oxygen Therapy: Patient Spontanous Breathing and Patient connected to nasal cannula oxygen  Post-op Assessment: Report given to PACU RN and Post -op Vital signs reviewed and stable  Post vital signs: Reviewed and stable  Complications: No apparent anesthesia complications

## 2014-01-02 NOTE — Op Note (Signed)
Patient:  [name]  DOB:  [birth year]  MRN:  [id number]   Preop Diagnosis:  Cholecystitis, cholelithiasis, hepatitis C  Postop Diagnosis:  same  Procedure:  Laparoscopic cholecystectomy, liver biopsy  Surgeon:  Aviva Signs, M.D.  Anes:  General endotracheal  Indications:  Patient is a 53 year old white female with history of hepatitis C who presents with cholecystitis secondary to cholelithiasis. The risks and benefits of the procedure including bleeding, infection, hepatobiliary injury, the possibility of an open procedure were fully explained to the patient, who gave informed consent.  Procedure note:  The patient was placed the supine position. After induction of general endotracheal anesthesia, the abdomen was prepped and draped using usual sterile technique with DuraPrep. Surgical site confirmation was performed.  The supraumbilical incision was made down the fascia. A Veress needle was introduced into the abdominal cavity and confirmation of placement was done using the saline drop test. The abdomen was then insufflated to 16 mmHg pressure. An 11 mm trocar was introduced into the abdominal cavity under direct visualization without difficulty. Patient was placed in reverse Trendelenburg position and additional 11 mm trocar was placed the epigastric region. 5 mm trochars were placed the right upper quadrant and right flank regions. The liver was inspected and noted to be without nodular changes. A Tru-Cut needle biopsy of the right lobe liver was performed and sent to pathology further examination. The puncture site was cauterized using Bovie electrocautery. The gallbladder was retracted in a dynamic fashion in order to expose the triangle of Calot.the cystic duct was first identified. Its junction to the infundibulum was fully identified. Endoclips were placed proximally and distally on the cystic duct, and the cystic duct was divided. This was likewise done cystic artery. The  gallbladder was freed away from the gallbladder fossa using Bovie electrocautery. The gallbladder was delivered to the epigastric trocar site using an Endo Catch bag. The gallbladder fossa was inspected no abnormal bleeding or bile leakage was noted. Surgicel is placed the gallbladder fossa. All fluid and air were then evacuated from the abdominal cavity prior to removal of the trochars.  All wounds were irrigated with normal saline. All wounds were injected with 0.5% Sensorcaine. The supraumbilical fascia was reapproximated using an 0 Vicryl interrupted suture. All skin incisions were closed using staples. Betadine ointment and dry sterile dressings were applied.  All tape and needle counts were correct at the end of the procedure. The patient was extubated in the operating room and transferred to PACU in stable condition.  Complications:  none  EBL:  minimal  Specimen:  Gallbladder, liver biopsy

## 2014-01-02 NOTE — Anesthesia Procedure Notes (Signed)
Procedure Name: Intubation Date/Time: 01/02/2014 7:47 AM Performed by: Sanjna Haskew, Sheron Nightingale Pre-anesthesia Checklist: Patient identified, Timeout performed, Emergency Drugs available, Suction available and Patient being monitored Patient Re-evaluated:Patient Re-evaluated prior to inductionOxygen Delivery Method: Circle system utilized Preoxygenation: Pre-oxygenation with 100% oxygen Intubation Type: IV induction Ventilation: Mask ventilation without difficulty Laryngoscope Size: Mac and 3 Grade View: Grade I Tube type: Oral Number of attempts: 1 Placement Confirmation: ETT inserted through vocal cords under direct vision,  positive ETCO2 and breath sounds checked- equal and bilateral Secured at: 22 cm Dental Injury: Teeth and Oropharynx as per pre-operative assessment

## 2014-01-05 ENCOUNTER — Encounter (HOSPITAL_COMMUNITY): Payer: Self-pay | Admitting: General Surgery

## 2014-04-20 ENCOUNTER — Other Ambulatory Visit: Payer: Self-pay | Admitting: Internal Medicine

## 2016-11-07 ENCOUNTER — Ambulatory Visit (INDEPENDENT_AMBULATORY_CARE_PROVIDER_SITE_OTHER): Payer: Medicaid Other | Admitting: Obstetrics & Gynecology

## 2016-11-07 ENCOUNTER — Encounter: Payer: Self-pay | Admitting: Obstetrics & Gynecology

## 2016-11-07 VITALS — BP 142/86 | HR 110 | Ht 64.0 in | Wt 159.0 lb

## 2016-11-07 DIAGNOSIS — N3941 Urge incontinence: Secondary | ICD-10-CM | POA: Diagnosis not present

## 2016-11-07 DIAGNOSIS — N951 Menopausal and female climacteric states: Secondary | ICD-10-CM

## 2016-11-07 MED ORDER — OXYBUTYNIN CHLORIDE 5 MG PO TABS: 5.0000 mg | ORAL_TABLET | Freq: Three times a day (TID) | ORAL | 3 refills | Status: DC

## 2016-11-07 MED ORDER — ALPRAZOLAM 0.5 MG PO TABS: 0.5000 mg | ORAL_TABLET | Freq: Two times a day (BID) | ORAL | 0 refills | Status: DC | PRN

## 2016-11-07 MED ORDER — ESTRADIOL 2 MG PO TABS: ORAL_TABLET | ORAL | 11 refills | Status: DC

## 2016-11-07 MED ORDER — MEDROXYPROGESTERONE ACETATE 2.5 MG PO TABS: 2.5000 mg | ORAL_TABLET | Freq: Every day | ORAL | 11 refills | Status: DC
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
## 2016-11-10 ENCOUNTER — Other Ambulatory Visit (HOSPITAL_COMMUNITY): Payer: Self-pay | Admitting: Pulmonary Disease

## 2016-11-10 ENCOUNTER — Ambulatory Visit (HOSPITAL_COMMUNITY)
Admission: RE | Admit: 2016-11-10 | Discharge: 2016-11-10 | Disposition: A | Discharge status: Home / Self Care | Payer: Medicaid Other | Source: Ambulatory Visit | Attending: Pulmonary Disease | Admitting: Pulmonary Disease

## 2016-11-10 DIAGNOSIS — M545 Low back pain: Secondary | ICD-10-CM | POA: Diagnosis present

## 2016-11-10 DIAGNOSIS — M5136 Other intervertebral disc degeneration, lumbar region: Secondary | ICD-10-CM | POA: Diagnosis not present

## 2016-11-13 ENCOUNTER — Other Ambulatory Visit: Payer: Self-pay | Admitting: Obstetrics & Gynecology

## 2016-11-13 ENCOUNTER — Telehealth: Payer: Self-pay | Admitting: Obstetrics & Gynecology

## 2016-11-13 MED ORDER — FLUCONAZOLE 150 MG PO TABS: 150.0000 mg | ORAL_TABLET | Freq: Once | ORAL | 0 refills | Status: AC

## 2016-11-13 NOTE — Telephone Encounter (Signed)
Patient called with complaints of itching and discharge, requesting Diflucan. Please advise.

## 2016-11-13 NOTE — Telephone Encounter (Signed)
done

## 2016-11-13 NOTE — Telephone Encounter (Signed)
Informed patient Diflucan was sent to pharmacy. Verbalized understanding.

## 2016-11-14 ENCOUNTER — Telehealth: Payer: Self-pay

## 2016-11-14 ENCOUNTER — Ambulatory Visit (INDEPENDENT_AMBULATORY_CARE_PROVIDER_SITE_OTHER): Payer: Medicaid Other | Admitting: Internal Medicine

## 2016-11-14 ENCOUNTER — Other Ambulatory Visit: Payer: Self-pay

## 2016-11-14 VITALS — BP 124/78 | HR 88 | Temp 97.5°F | Ht 63.0 in | Wt 163.6 lb

## 2016-11-14 DIAGNOSIS — K648 Other hemorrhoids: Secondary | ICD-10-CM

## 2016-11-14 DIAGNOSIS — R1310 Dysphagia, unspecified: Secondary | ICD-10-CM

## 2016-11-14 DIAGNOSIS — K5909 Other constipation: Secondary | ICD-10-CM

## 2016-11-14 DIAGNOSIS — K219 Gastro-esophageal reflux disease without esophagitis: Secondary | ICD-10-CM | POA: Diagnosis not present

## 2016-11-14 DIAGNOSIS — B1920 Unspecified viral hepatitis C without hepatic coma: Secondary | ICD-10-CM

## 2016-11-14 DIAGNOSIS — K921 Melena: Secondary | ICD-10-CM

## 2016-11-14 DIAGNOSIS — R1319 Other dysphagia: Secondary | ICD-10-CM

## 2016-11-14 MED ORDER — LINACLOTIDE 145 MCG PO CAPS: 145.0000 ug | ORAL_CAPSULE | Freq: Every day | ORAL | 11 refills | Status: DC

## 2016-11-14 NOTE — Telephone Encounter (Signed)
Called and informed pt of pre-op appt 12/11/16 at 11:00am. Letter mailed.
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
## 2016-11-14 NOTE — Patient Instructions (Signed)
Continue Prilosec 20 mg daily  GERD and constipation information provided  Begin Linzess 145 one capsule daily. Dispense 30 with 11 refills.  Hepatitis C quantitative RNA level, INR, Chem-12, CBC  Schedule EGD with esophageal dilation. Will need propofol.  Return office visit in 6 weeks with extender to set up treatment for hepatitis C  Return office visit with me in 4 weeks for repeat hemorrhoid banding.

## 2016-11-17 LAB — CBC WITH DIFFERENTIAL/PLATELET
BASOS ABS: 28 {cells}/uL (ref 0–200)
Basophils Relative: 0.5 %
Eosinophils Absolute: 149 cells/uL (ref 15–500)
Eosinophils Relative: 2.7 %
HEMATOCRIT: 35.4 % (ref 35.0–45.0)
Hemoglobin: 11.8 g/dL (ref 11.7–15.5)
LYMPHS ABS: 1485 {cells}/uL (ref 850–3900)
MCH: 26.9 pg — ABNORMAL LOW (ref 27.0–33.0)
MCHC: 33.3 g/dL (ref 32.0–36.0)
MCV: 80.8 fL (ref 80.0–100.0)
MPV: 9.9 fL (ref 7.5–12.5)
Monocytes Relative: 5.8 %
Neutro Abs: 3520 cells/uL (ref 1500–7800)
Neutrophils Relative %: 64 %
Platelets: 247 10*3/uL (ref 140–400)
RBC: 4.38 10*6/uL (ref 3.80–5.10)
RDW: 13.2 % (ref 11.0–15.0)
Total Lymphocyte: 27 %
WBC: 5.5 10*3/uL (ref 3.8–10.8)
WBCMIX: 319 {cells}/uL (ref 200–950)

## 2016-11-17 LAB — COMPREHENSIVE METABOLIC PANEL
AG Ratio: 1.7 (calc) (ref 1.0–2.5)
ALBUMIN MSPROF: 4.4 g/dL (ref 3.6–5.1)
ALKALINE PHOSPHATASE (APISO): 71 U/L (ref 33–130)
ALT: 23 U/L (ref 6–29)
AST: 22 U/L (ref 10–35)
BILIRUBIN TOTAL: 0.4 mg/dL (ref 0.2–1.2)
BUN: 11 mg/dL (ref 7–25)
CO2: 25 mmol/L (ref 20–32)
Calcium: 9.3 mg/dL (ref 8.6–10.4)
Chloride: 99 mmol/L (ref 98–110)
Creat: 0.9 mg/dL (ref 0.50–1.05)
Globulin: 2.6 g/dL (calc) (ref 1.9–3.7)
Glucose, Bld: 105 mg/dL (ref 65–139)
Potassium: 4.3 mmol/L (ref 3.5–5.3)
Sodium: 135 mmol/L (ref 135–146)
Total Protein: 7 g/dL (ref 6.1–8.1)

## 2016-11-17 LAB — HEPATITIS C RNA QUANTITATIVE
HCV Quantitative Log: 7 Log IU/mL — ABNORMAL HIGH
HCV RNA, PCR, QN: 10100000 [IU]/mL — AB

## 2016-11-17 LAB — PROTIME-INR
INR: 0.9
Prothrombin Time: 10 s (ref 9.0–11.5)

## 2016-11-17 LAB — HEPATITIS C GENOTYPE

## 2016-12-04 ENCOUNTER — Encounter: Payer: Self-pay | Admitting: General Practice

## 2016-12-04 ENCOUNTER — Telehealth: Payer: Self-pay | Admitting: General Practice

## 2016-12-04 NOTE — Telephone Encounter (Signed)
I called the patient and made her aware of her new procedure appt and pre-op appt.  I will mail out new instructions.

## 2016-12-11 ENCOUNTER — Other Ambulatory Visit (HOSPITAL_COMMUNITY): Payer: Medicaid Other

## 2016-12-11 ENCOUNTER — Ambulatory Visit: Payer: Medicaid Other | Admitting: Gastroenterology

## 2016-12-12 ENCOUNTER — Encounter: Payer: Medicaid Other | Admitting: Internal Medicine

## 2016-12-12 ENCOUNTER — Ambulatory Visit (INDEPENDENT_AMBULATORY_CARE_PROVIDER_SITE_OTHER): Payer: Medicaid Other | Admitting: Internal Medicine

## 2016-12-12 ENCOUNTER — Encounter: Payer: Self-pay | Admitting: Internal Medicine

## 2016-12-12 VITALS — BP 145/89 | HR 98 | Temp 96.7°F | Ht 63.0 in | Wt 163.0 lb

## 2016-12-12 DIAGNOSIS — K648 Other hemorrhoids: Secondary | ICD-10-CM

## 2016-12-12 NOTE — Progress Notes (Signed)
[place] banding procedure note:  The patient presents with symptomatic Hemorrhoids:  Status post banding of the right anterior more recently. Still having some bleeding;  constipation is still an issue in spite of Linzess 290 daily. She desires additional banding today. All risks, benefits, and alternative forms of therapy were described and informed consent was obtained.  In the left lateral decubitus position, a DRE revealed no abnormalities.  Nitroglycerin and Xylocaine topically was used as lubricant.  The decision was made to band the  right posterior internal hemorrhoid; the [place] was used to perform band ligation without complication. Digital anorectal examination was then performed to assure proper positioning of the band;  band found been excellent position. No pinching or pain. Subsequently, I elected to place a left lateral hemorrhoid band. Done without difficulty. Band found to be in excellent position. Again, no pinching or pain.  The patient was discharged home without pain or other issues. Dietary and behavioral recommendations were given.  Linzess increased to 290 - 1 - capsule daily. Samples provided.   The patient will return in  4 weeks  for follow-up  to reassess and initiate hepatitis C treatment.   No complications were encountered and the patient tolerated the procedure well.

## 2016-12-12 NOTE — Patient Instructions (Signed)
Avoid straining.  Benefiber 1 tablespoon twice daily  Increase Linzess to 290 daily - samples provided  Limit toilet time to 5 minutes  Call with any interim problems  Follow-up appointment in 4 weeks with extender to reassess and initiate hepatitis C treatment.

## 2016-12-15 NOTE — Patient Instructions (Signed)
[name]  12/15/2016     @PREFPERIOPPHARMACY @   Your procedure is scheduled on  12/25/2016   Report to [facility] at  9  A.M.  Call this number if you have problems the morning of surgery:  [phone]   Remember:  Do not eat food or drink liquids after midnight.  Take these medicines the morning of surgery with A SIP OF WATER  Xanax, norvasc, flexaril, prilosec, ditropan, trimpex.   Do not wear jewelry, make-up or nail polish.  Do not wear lotions, powders, or perfumes, or deoderant.  Do not shave 48 hours prior to surgery.  Men may shave face and neck.  Do not bring valuables to the hospital.  [facility] is not responsible for any belongings or valuables.  Contacts, dentures or bridgework may not be worn into surgery.  Leave your suitcase in the car.  After surgery it may be brought to your room.  For patients admitted to the hospital, discharge time will be determined by your treatment team.  Patients discharged the day of surgery will not be allowed to drive home.   Name and phone number of your driver:   family Special instructions:  Follow the diet instructions given to you by Dr Roseanne Kaufman office.  Please read over the following fact sheets that you were given. Anesthesia Post-op Instructions and Care and Recovery After Surgery       Esophagogastroduodenoscopy Esophagogastroduodenoscopy (EGD) is a procedure to examine the lining of the esophagus, stomach, and first part of the small intestine (duodenum). This procedure is done to check for problems such as inflammation, bleeding, ulcers, or growths. During this procedure, a long, flexible, lighted tube with a camera attached (endoscope) is inserted down the throat. Tell a health care provider about:  Any allergies you have.  All medicines you are taking, including vitamins, herbs, eye drops, creams, and over-the-counter medicines.  Any problems you or family members have had with  anesthetic medicines.  Any blood disorders you have.  Any surgeries you have had.  Any medical conditions you have.  Whether you are pregnant or may be pregnant. What are the risks? Generally, this is a safe procedure. However, problems may occur, including:  Infection.  Bleeding.  A tear (perforation) in the esophagus, stomach, or duodenum.  Trouble breathing.  Excessive sweating.  Spasms of the larynx.  A slowed heartbeat.  Low blood pressure.  What happens before the procedure?  Follow instructions from your health care provider about eating or drinking restrictions.  Ask your health care provider about: ? Changing or stopping your regular medicines. This is especially important if you are taking diabetes medicines or blood thinners. ? Taking medicines such as aspirin and ibuprofen. These medicines can thin your blood. Do not take these medicines before your procedure if your health care provider instructs you not to.  Plan to have someone take you home after the procedure.  If you wear dentures, be ready to remove them before the procedure. What happens during the procedure?  To reduce your risk of infection, your health care team will wash or sanitize their hands.  An IV tube will be put in a vein in your hand or arm. You will get medicines and fluids through this tube.  You will be given one or more of the following: ? A medicine to help you relax (sedative). ? A medicine to numb the area (local anesthetic). This medicine may  be sprayed into your throat. It will make you feel more comfortable and keep you from gagging or coughing during the procedure. ? A medicine for pain.  A mouth guard may be placed in your mouth to protect your teeth and to keep you from biting on the endoscope.  You will be asked to lie on your left side.  The endoscope will be lowered down your throat into your esophagus, stomach, and duodenum.  Air will be put into the endoscope.  This will help your health care provider see better.  The lining of your esophagus, stomach, and duodenum will be examined.  Your health care provider may: ? Take a tissue sample so it can be looked at in a lab (biopsy). ? Remove growths. ? Remove objects (foreign bodies) that are stuck. ? Treat any bleeding with medicines or other devices that stop tissue from bleeding. ? Widen (dilate) or stretch narrowed areas of your esophagus and stomach.  The endoscope will be taken out. The procedure may vary among health care providers and hospitals. What happens after the procedure?  Your blood pressure, heart rate, breathing rate, and blood oxygen level will be monitored often until the medicines you were given have worn off.  Do not eat or drink anything until the numbing medicine has worn off and your gag reflex has returned. This information is not intended to replace advice given to you by your health care provider. Make sure you discuss any questions you have with your health care provider. Document Released: 06/16/2004 Document Revised: 07/22/2015 Document Reviewed: 01/07/2015 Elsevier Interactive Patient Education  2018 [employer]. Esophagogastroduodenoscopy, Care After Refer to this sheet in the next few weeks. These instructions provide you with information about caring for yourself after your procedure. Your health care provider may also give you more specific instructions. Your treatment has been planned according to current medical practices, but problems sometimes occur. Call your health care provider if you have any problems or questions after your procedure. What can I expect after the procedure? After the procedure, it is common to have:  A sore throat.  Nausea.  Bloating.  Dizziness.  Fatigue.  Follow these instructions at home:  Do not eat or drink anything until the numbing medicine (local anesthetic) has worn off and your gag reflex has returned. You will know  that the local anesthetic has worn off when you can swallow comfortably.  Do not drive for 24 hours if you received a medicine to help you relax (sedative).  If your health care provider took a tissue sample for testing during the procedure, make sure to get your test results. This is your responsibility. Ask your health care provider or the department performing the test when your results will be ready.  Keep all follow-up visits as told by your health care provider. This is important. Contact a health care provider if:  You cannot stop coughing.  You are not urinating.  You are urinating less than usual. Get help right away if:  You have trouble swallowing.  You cannot eat or drink.  You have throat or chest pain that gets worse.  You are dizzy or light-headed.  You faint.  You have nausea or vomiting.  You have chills.  You have a fever.  You have severe abdominal pain.  You have black, tarry, or bloody stools. This information is not intended to replace advice given to you by your health care provider. Make sure you discuss any questions you have with  your health care provider. Document Released: 01/31/2012 Document Revised: 07/22/2015 Document Reviewed: 01/07/2015 Elsevier Interactive Patient Education  2018 [place] Anesthesia is a term that refers to techniques, procedures, and medicines that help a person stay safe and comfortable during a medical procedure. Monitored anesthesia care, or sedation, is one type of anesthesia. Your anesthesia specialist may recommend sedation if you will be having a procedure that does not require you to be unconscious, such as:  Cataract surgery.  A dental procedure.  A biopsy.  A colonoscopy.  During the procedure, you may receive a medicine to help you relax (sedative). There are three levels of sedation:  Mild sedation. At this level, you may feel awake and relaxed. You will be able to  follow directions.  Moderate sedation. At this level, you will be sleepy. You may not remember the procedure.  Deep sedation. At this level, you will be asleep. You will not remember the procedure.  The more medicine you are given, the deeper your level of sedation will be. Depending on how you respond to the procedure, the anesthesia specialist may change your level of sedation or the type of anesthesia to fit your needs. An anesthesia specialist will monitor you closely during the procedure. Let your health care provider know about:  Any allergies you have.  All medicines you are taking, including vitamins, herbs, eye drops, creams, and over-the-counter medicines.  Any use of steroids (by mouth or as a cream).  Any problems you or family members have had with sedatives and anesthetic medicines.  Any blood disorders you have.  Any surgeries you have had.  Any medical conditions you have, such as sleep apnea.  Whether you are pregnant or may be pregnant.  Any use of cigarettes, alcohol, or street drugs. What are the risks? Generally, this is a safe procedure. However, problems may occur, including:  Getting too much medicine (oversedation).  Nausea.  Allergic reaction to medicines.  Trouble breathing. If this happens, a breathing tube may be used to help with breathing. It will be removed when you are awake and breathing on your own.  Heart trouble.  Lung trouble.  Before the procedure Staying hydrated Follow instructions from your health care provider about hydration, which may include:  Up to 2 hours before the procedure - you may continue to drink clear liquids, such as water, clear fruit juice, black coffee, and plain tea.  Eating and drinking restrictions Follow instructions from your health care provider about eating and drinking, which may include:  8 hours before the procedure - stop eating heavy meals or foods such as meat, fried foods, or fatty foods.  6  hours before the procedure - stop eating light meals or foods, such as toast or cereal.  6 hours before the procedure - stop drinking milk or drinks that contain milk.  2 hours before the procedure - stop drinking clear liquids.  Medicines Ask your health care provider about:  Changing or stopping your regular medicines. This is especially important if you are taking diabetes medicines or blood thinners.  Taking medicines such as aspirin and ibuprofen. These medicines can thin your blood. Do not take these medicines before your procedure if your health care provider instructs you not to.  Tests and exams  You will have a physical exam.  You may have blood tests done to show: ? How well your kidneys and liver are working. ? How well your blood can clot.  General instructions  Plan to have someone take you home from the hospital or clinic.  If you will be going home right after the procedure, plan to have someone with you for 24 hours.  What happens during the procedure?  Your blood pressure, heart rate, breathing, level of pain and overall condition will be monitored.  An IV tube will be inserted into one of your veins.  Your anesthesia specialist will give you medicines as needed to keep you comfortable during the procedure. This may mean changing the level of sedation.  The procedure will be performed. After the procedure  Your blood pressure, heart rate, breathing rate, and blood oxygen level will be monitored until the medicines you were given have worn off.  Do not drive for 24 hours if you received a sedative.  You may: ? Feel sleepy, clumsy, or nauseous. ? Feel forgetful about what happened after the procedure. ? Have a sore throat if you had a breathing tube during the procedure. ? Vomit. This information is not intended to replace advice given to you by your health care provider. Make sure you discuss any questions you have with your health care  provider. Document Released: 11/09/2004 Document Revised: 07/23/2015 Document Reviewed: 06/06/2015 Elsevier Interactive Patient Education  2018 [place], Care After These instructions provide you with information about caring for yourself after your procedure. Your health care provider may also give you more specific instructions. Your treatment has been planned according to current medical practices, but problems sometimes occur. Call your health care provider if you have any problems or questions after your procedure. What can I expect after the procedure? After your procedure, it is common to:  Feel sleepy for several hours.  Feel clumsy and have poor balance for several hours.  Feel forgetful about what happened after the procedure.  Have poor judgment for several hours.  Feel nauseous or vomit.  Have a sore throat if you had a breathing tube during the procedure.  Follow these instructions at home: For at least 24 hours after the procedure:   Do not: ? Participate in activities in which you could fall or become injured. ? Drive. ? Use heavy machinery. ? Drink alcohol. ? Take sleeping pills or medicines that cause drowsiness. ? Make important decisions or sign legal documents. ? Take care of children on your own.  Rest. Eating and drinking  Follow the diet that is recommended by your health care provider.  If you vomit, drink water, juice, or soup when you can drink without vomiting.  Make sure you have little or no nausea before eating solid foods. General instructions  Have a responsible adult stay with you until you are awake and alert.  Take over-the-counter and prescription medicines only as told by your health care provider.  If you smoke, do not smoke without supervision.  Keep all follow-up visits as told by your health care provider. This is important. Contact a health care provider if:  You keep feeling nauseous or you  keep vomiting.  You feel light-headed.  You develop a rash.  You have a fever. Get help right away if:  You have trouble breathing. This information is not intended to replace advice given to you by your health care provider. Make sure you discuss any questions you have with your health care provider. Document Released: 06/06/2015 Document Revised: 10/06/2015 Document Reviewed: 06/06/2015 Elsevier Interactive Patient Education  [employer].

## 2016-12-18 ENCOUNTER — Encounter (HOSPITAL_COMMUNITY)
Admission: RE | Admit: 2016-12-18 | Discharge: 2016-12-18 | Disposition: A | Discharge status: Home / Self Care | Payer: Medicaid Other | Source: Ambulatory Visit | Attending: Internal Medicine | Admitting: Internal Medicine

## 2016-12-18 ENCOUNTER — Encounter (HOSPITAL_COMMUNITY): Payer: Self-pay

## 2016-12-18 ENCOUNTER — Other Ambulatory Visit: Payer: Self-pay

## 2016-12-18 DIAGNOSIS — R9431 Abnormal electrocardiogram [ECG] [EKG]: Secondary | ICD-10-CM | POA: Diagnosis not present

## 2016-12-18 DIAGNOSIS — Z01818 Encounter for other preprocedural examination: Secondary | ICD-10-CM | POA: Diagnosis not present

## 2016-12-18 DIAGNOSIS — I10 Essential (primary) hypertension: Secondary | ICD-10-CM | POA: Insufficient documentation

## 2016-12-19 ENCOUNTER — Encounter: Payer: Medicaid Other | Admitting: Internal Medicine

## 2016-12-20 ENCOUNTER — Telehealth: Payer: Self-pay | Admitting: Internal Medicine

## 2016-12-20 NOTE — Telephone Encounter (Signed)
[phone] patient called wanting to know if she can have a prescription sent to [facility].  She stated that she needs phenegrin for nausea and a mouthwash.  Please call her

## 2016-12-20 NOTE — Telephone Encounter (Signed)
Pt is calling and wanting an rx Phenergan. Pt has been vomiting from 3AM-7AM. No current pain in abdomen. Pt would also like to ask for a generic Biotene (mouthwash) to help with Dry mouth. PT says she was prescribed this in [place]. Pt said her dentist can't prescribe med for her when asked. Please advise.

## 2016-12-21 ENCOUNTER — Telehealth: Payer: Self-pay | Admitting: Internal Medicine

## 2016-12-21 NOTE — Telephone Encounter (Signed)
Communication noted.  

## 2016-12-21 NOTE — Telephone Encounter (Signed)
PATIENT CALLED INQUIRING IF MOUTHWASH AND SOMETHING FOR NAUSEA WAS EVER CALLED INTO HER PHARMACY

## 2016-12-21 NOTE — Telephone Encounter (Signed)
PATIENT CALLED BACK AND STATED THAT SHE REACHED HER PCP FOR THIS AND THEY WILL SEND IT IN

## 2016-12-21 NOTE — Telephone Encounter (Signed)
Meds have been taken care of by PCP. Please disregard previous message.  Routing to Dr. [name],

## 2016-12-25 ENCOUNTER — Encounter (HOSPITAL_COMMUNITY): Payer: Self-pay

## 2016-12-25 ENCOUNTER — Encounter (HOSPITAL_COMMUNITY)
Admission: RE | Disposition: A | Discharge status: Home / Self Care | Payer: Self-pay | Source: Ambulatory Visit | Attending: Internal Medicine

## 2016-12-25 ENCOUNTER — Ambulatory Visit (HOSPITAL_COMMUNITY)
Admission: RE | Admit: 2016-12-25 | Discharge: 2016-12-25 | Disposition: A | Discharge status: Home / Self Care | Payer: Medicaid Other | Source: Ambulatory Visit | Attending: Internal Medicine | Admitting: Internal Medicine

## 2016-12-25 ENCOUNTER — Ambulatory Visit (HOSPITAL_COMMUNITY): Payer: Medicaid Other | Admitting: Anesthesiology

## 2016-12-25 DIAGNOSIS — Z79899 Other long term (current) drug therapy: Secondary | ICD-10-CM | POA: Diagnosis not present

## 2016-12-25 DIAGNOSIS — F17210 Nicotine dependence, cigarettes, uncomplicated: Secondary | ICD-10-CM | POA: Insufficient documentation

## 2016-12-25 DIAGNOSIS — K5900 Constipation, unspecified: Secondary | ICD-10-CM | POA: Insufficient documentation

## 2016-12-25 DIAGNOSIS — F419 Anxiety disorder, unspecified: Secondary | ICD-10-CM | POA: Insufficient documentation

## 2016-12-25 DIAGNOSIS — Z7982 Long term (current) use of aspirin: Secondary | ICD-10-CM | POA: Diagnosis not present

## 2016-12-25 DIAGNOSIS — R1319 Other dysphagia: Secondary | ICD-10-CM

## 2016-12-25 DIAGNOSIS — E7800 Pure hypercholesterolemia, unspecified: Secondary | ICD-10-CM | POA: Insufficient documentation

## 2016-12-25 DIAGNOSIS — K449 Diaphragmatic hernia without obstruction or gangrene: Secondary | ICD-10-CM | POA: Diagnosis not present

## 2016-12-25 DIAGNOSIS — Z79890 Hormone replacement therapy (postmenopausal): Secondary | ICD-10-CM | POA: Insufficient documentation

## 2016-12-25 DIAGNOSIS — K3189 Other diseases of stomach and duodenum: Secondary | ICD-10-CM | POA: Diagnosis not present

## 2016-12-25 DIAGNOSIS — K219 Gastro-esophageal reflux disease without esophagitis: Secondary | ICD-10-CM | POA: Diagnosis not present

## 2016-12-25 DIAGNOSIS — Z791 Long term (current) use of non-steroidal anti-inflammatories (NSAID): Secondary | ICD-10-CM | POA: Insufficient documentation

## 2016-12-25 DIAGNOSIS — R1310 Dysphagia, unspecified: Secondary | ICD-10-CM | POA: Insufficient documentation

## 2016-12-25 DIAGNOSIS — I10 Essential (primary) hypertension: Secondary | ICD-10-CM | POA: Insufficient documentation

## 2016-12-25 DIAGNOSIS — K2950 Unspecified chronic gastritis without bleeding: Secondary | ICD-10-CM | POA: Diagnosis not present

## 2016-12-25 DIAGNOSIS — I69311 Memory deficit following cerebral infarction: Secondary | ICD-10-CM | POA: Insufficient documentation

## 2016-12-25 HISTORY — PX: ESOPHAGOGASTRODUODENOSCOPY (EGD) WITH PROPOFOL: SHX5813

## 2016-12-25 HISTORY — PX: MALONEY DILATION: SHX5535

## 2016-12-25 HISTORY — PX: BIOPSY: SHX5522

## 2016-12-25 SURGERY — ESOPHAGOGASTRODUODENOSCOPY (EGD) WITH PROPOFOL
Anesthesia: Monitor Anesthesia Care

## 2016-12-25 MED ORDER — FENTANYL CITRATE (PF) 100 MCG/2ML IJ SOLN
INTRAMUSCULAR | Status: AC
  Filled 2016-12-25: qty 2

## 2016-12-25 MED ORDER — MIDAZOLAM HCL 2 MG/2ML IJ SOLN
1.0000 mg | INTRAMUSCULAR | Status: AC
  Administered 2016-12-25 (×2): 1 mg via INTRAVENOUS
  Filled 2016-12-25: qty 2

## 2016-12-25 MED ORDER — FENTANYL CITRATE (PF) 100 MCG/2ML IJ SOLN
25.0000 ug | Freq: Once | INTRAMUSCULAR | Status: AC
  Administered 2016-12-25: 25 ug via INTRAVENOUS

## 2016-12-25 MED ORDER — CHLORHEXIDINE GLUCONATE CLOTH 2 % EX PADS: 6.0000 | MEDICATED_PAD | Freq: Once | CUTANEOUS | Status: DC

## 2016-12-25 MED ORDER — LACTATED RINGERS IV SOLN
INTRAVENOUS | Status: DC
  Administered 2016-12-25: 11:00:00 via INTRAVENOUS

## 2016-12-25 MED ORDER — LIDOCAINE VISCOUS 2 % MT SOLN
OROMUCOSAL | Status: AC
  Filled 2016-12-25: qty 15

## 2016-12-25 MED ORDER — PROPOFOL 500 MG/50ML IV EMUL
INTRAVENOUS | Status: DC | PRN
  Administered 2016-12-25: 150 ug/kg/min via INTRAVENOUS

## 2016-12-25 MED ORDER — LIDOCAINE VISCOUS 2 % MT SOLN
6.0000 mL | Freq: Once | OROMUCOSAL | Status: AC
  Administered 2016-12-25: 6 mL via OROMUCOSAL

## 2016-12-25 MED ORDER — PROPOFOL 10 MG/ML IV BOLUS
INTRAVENOUS | Status: AC
  Filled 2016-12-25: qty 40

## 2016-12-25 NOTE — Discharge Instructions (Signed)
EGD Discharge instructions Please read the instructions outlined below and refer to this sheet in the next few weeks. These discharge instructions provide you with general information on caring for yourself after you leave the hospital. Your doctor may also give you specific instructions. While your treatment has been planned according to the most current medical practices available, unavoidable complications occasionally occur. If you have any problems or questions after discharge, please call your doctor. ACTIVITY  You may resume your regular activity but move at a slower pace for the next 24 hours.   Take frequent rest periods for the next 24 hours.   Walking will help expel (get rid of) the air and reduce the bloated feeling in your abdomen.   No driving for 24 hours (because of the anesthesia (medicine) used during the test).   You may shower.   Do not sign any important legal documents or operate any machinery for 24 hours (because of the anesthesia used during the test).  NUTRITION  Drink plenty of fluids.   You may resume your normal diet.   Begin with a light meal and progress to your normal diet.   Avoid alcoholic beverages for 24 hours or as instructed by your caregiver.  MEDICATIONS  You may resume your normal medications unless your caregiver tells you otherwise.  WHAT YOU CAN EXPECT TODAY  You may experience abdominal discomfort such as a feeling of fullness or gas pains.  FOLLOW-UP  Your doctor will discuss the results of your test with you.  SEEK IMMEDIATE MEDICAL ATTENTION IF ANY OF THE FOLLOWING OCCUR:  Excessive nausea (feeling sick to your stomach) and/or vomiting.   Severe abdominal pain and distention (swelling).   Trouble swallowing.   Temperature over 101 F (37.8 C).   Rectal bleeding or vomiting of blood.    Continue omeprazole 20 mg daily  Keep Next month's office appointment with [place]
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
## 2016-12-25 NOTE — Op Note (Signed)
[facility] Patient Name: [name] Procedure Date: 12/25/2016 11:35 AM MRN: [id number] Date of Birth: 12-28-1960 Attending MD: Norvel Richards , MD CSN: [id number] Age: 56 Admit Type: Outpatient Procedure:                Upper GI endoscopy Indications:              Dysphagia Providers:                Norvel Richards, MD, Selena Lesser, Lurline Del, RN, Randa Spike, Technician Referring MD:             Jasper Loser. Luan Pulling MD, MD Medicines:                Propofol per Anesthesia Complications:            No immediate complications. Estimated Blood Loss:     Estimated blood loss: none. Procedure:                Pre-Anesthesia Assessment:                           - Prior to the procedure, a History and Physical                            was performed, and patient medications and                            allergies were reviewed. The patient's tolerance of                            previous anesthesia was also reviewed. The risks                            and benefits of the procedure and the sedation                            options and risks were discussed with the patient.                            All questions were answered, and informed consent                            was obtained. Prior Anticoagulants: The patient has                            taken no previous anticoagulant or antiplatelet                            agents. ASA Grade Assessment: II - A patient with                            mild systemic disease. After reviewing the risks  and benefits, the patient was deemed in                            satisfactory condition to undergo the procedure.                           After obtaining informed consent, the endoscope was                            passed under direct vision. Throughout the                            procedure, the patient's blood pressure, pulse, and         oxygen saturations were monitored continuously. The                            EG-299OI ([id number]) scope was introduced through the                            and advanced to the second part of duodenum. The                            upper GI endoscopy was accomplished without                            difficulty. The patient tolerated the procedure                            well. Scope In: 91:47:82 PM Scope Out: 12:56:23 PM Total Procedure Duration: 0 hours 10 minutes 15 seconds  Findings:      The examined esophagus was normal. The scope was withdrawn. Dilation was       performed with a Maloney dilator with no resistance at 14 Fr. The scope       was withdrawn. Dilation was performed with a Maloney dilator with mild       resistance at 56 Fr. The dilation site was examined following endoscope       reinsertion and showed no change. Estimated blood loss: none.      Multiple erosions were found in the stomach. Estimated blood loss: none.       This was biopsied with a cold forceps for histology. This was biopsied       with a cold forceps for histology. Estimated blood loss was minimal.      The duodenal bulb and second portion of the duodenum were normal. Impression:               No esophageal varices.                           - Normal esophagus. Dilated.                           - Erosive gastropathy. Biopsied.                           - Normal duodenal bulb and second portion of  the                            duodenum. Moderate Sedation:      Moderate (conscious) sedation was personally administered by an       anesthesia professional. The following parameters were monitored: oxygen       saturation, heart rate, blood pressure, respiratory rate, EKG, adequacy       of pulmonary ventilation, and response to care. Total physician       intraservice time was 16 minutes. Recommendation:           - Return to my office in 1 month.                           - Advance diet as  tolerated.                           - Continue present medications.                           - Await pathology results. Procedure Code(s):        --- Professional ---                           [phone], Esophagogastroduodenoscopy, flexible,                            transoral; with biopsy, single or multiple                           43450, Dilation of esophagus, by unguided sound or                            bougie, single or multiple passes Diagnosis Code(s):        --- Professional ---                           K31.89, Other diseases of stomach and duodenum                           R13.10, Dysphagia, unspecified CPT copyright 2016 American Medical Association. All rights reserved. The codes documented in this report are preliminary and upon coder review may  be revised to meet current compliance requirements. Cristopher Estimable. Sharilynn Cassity, MD Norvel Richards, MD 12/25/2016 1:11:09 PM This report has been signed electronically. Number of Addenda: 0

## 2016-12-25 NOTE — Anesthesia Postprocedure Evaluation (Signed)
Anesthesia Post Note  Patient: [name]  Procedure(s) Performed: ESOPHAGOGASTRODUODENOSCOPY (EGD) WITH PROPOFOL (N/A ) MALONEY DILATION (N/A )  Patient location during evaluation: PACU Anesthesia Type: MAC Level of consciousness: awake and alert and oriented Pain management: pain level controlled Vital Signs Assessment: post-procedure vital signs reviewed and stable Respiratory status: spontaneous breathing Cardiovascular status: blood pressure returned to baseline Postop Assessment: no apparent nausea or vomiting Anesthetic complications: no     Last Vitals:  Vitals:   12/25/16 1300 12/25/16 1315  BP: 126/81 128/84  Pulse: 84 89  Resp: 20 20  Temp: 36.5 C   SpO2: 99% 97%    Last Pain:  Vitals:   12/25/16 1040  TempSrc: Oral                 Sophiana Milanese

## 2016-12-25 NOTE — Transfer of Care (Signed)
Immediate Anesthesia Transfer of Care Note  Patient: [name]  Procedure(s) Performed: ESOPHAGOGASTRODUODENOSCOPY (EGD) WITH PROPOFOL (N/A ) MALONEY DILATION (N/A )  Patient Location: PACU  Anesthesia Type:MAC  Level of Consciousness: awake and alert   Airway & Oxygen Therapy: Patient Spontanous Breathing  Post-op Assessment: Report given to RN  Post vital signs: Reviewed and stable  Last Vitals:  Vitals:   12/25/16 1040 12/25/16 1100  BP: 121/69 123/77  Pulse: 77   Resp:  12  Temp: 36.7 C   SpO2: 95% 99%    Last Pain:  Vitals:   12/25/16 1040  TempSrc: Oral         Complications: No apparent anesthesia complications

## 2016-12-25 NOTE — Anesthesia Preprocedure Evaluation (Signed)
Anesthesia Evaluation  Patient identified by MRN, date of birth, ID band Patient awake    Reviewed: Allergy & Precautions, H&P , NPO status , Patient's Chart, lab work & pertinent test results  Airway Mallampati: II       Dental  (+) Teeth Intact   Pulmonary shortness of breath and with exertion, Current Smoker,    breath sounds clear to auscultation       Cardiovascular hypertension, Pt. on medications (-) angina Rhythm:Regular     Neuro/Psych  Headaches, PSYCHIATRIC DISORDERS Anxiety Bipolar Disorder  Neuromuscular disease    GI/Hepatic hiatal hernia, GERD  Medicated,(+) Hepatitis -, C  Endo/Other    Renal/GU      Musculoskeletal  (+) Arthritis ,   Abdominal   Peds  Hematology  (+) anemia ,   Anesthesia Other Findings   Reproductive/Obstetrics                             Anesthesia Physical Anesthesia Plan  ASA: III  Anesthesia Plan: MAC   Post-op Pain Management:    Induction: Intravenous  PONV Risk Score and Plan:   Airway Management Planned: Simple Face Mask  Additional Equipment:   Intra-op Plan:   Post-operative Plan:   Informed Consent: I have reviewed the patients History and Physical, chart, labs and discussed the procedure including the risks, benefits and alternatives for the proposed anesthesia with the patient or authorized representative who has indicated his/her understanding and acceptance.     Plan Discussed with:   Anesthesia Plan Comments:         Anesthesia Quick Evaluation

## 2016-12-26 ENCOUNTER — Encounter: Payer: Self-pay | Admitting: Internal Medicine

## 2016-12-27 ENCOUNTER — Telehealth: Payer: Self-pay

## 2016-12-27 ENCOUNTER — Encounter (HOSPITAL_COMMUNITY): Payer: Self-pay | Admitting: Internal Medicine

## 2016-12-27 NOTE — Telephone Encounter (Signed)
Pt has an appointment next week with Dr. [name] and wanted to call ahead and add her new medications.   Meds- Guaiatussin, Zofran, Latuda 20mg , Adderall 15mg 

## 2016-12-28 ENCOUNTER — Telehealth: Payer: Self-pay | Admitting: Internal Medicine

## 2016-12-28 NOTE — Telephone Encounter (Signed)
Pt called office. She is aware that samples are at front desk.

## 2016-12-28 NOTE — Telephone Encounter (Signed)
Pt called to see is her procedure results were available. [phone]

## 2016-12-28 NOTE — Telephone Encounter (Signed)
LMOM, pt notified that samples will be left up front for her.

## 2016-12-28 NOTE — Telephone Encounter (Signed)
Pt notified of results. Pt r/s her apt that was scheduled next week to 02/09/17 due to Medicaid expiring. Pt isn't able to get her medication and would like samples of Linzess 290. Please advise.

## 2016-12-28 NOTE — Telephone Encounter (Signed)
Samples okay?

## 2017-01-02 ENCOUNTER — Ambulatory Visit: Payer: Medicaid Other | Admitting: Gastroenterology

## 2017-01-04 DIAGNOSIS — Z029 Encounter for administrative examinations, unspecified: Secondary | ICD-10-CM

## 2017-01-15 NOTE — Telephone Encounter (Signed)
Pt called office requesting more samples of Linzess 217mcg. She said her Medicaid hasn't been approved yet.

## 2017-01-16 NOTE — Telephone Encounter (Signed)
I'm okay with helping her out with whatever samples are available

## 2017-01-16 NOTE — Telephone Encounter (Signed)
3 boxes of Linzess 244mcg placed at front desk. Called and informed pt.

## 2017-01-30 ENCOUNTER — Telehealth: Payer: Self-pay | Admitting: Internal Medicine

## 2017-01-30 NOTE — Telephone Encounter (Signed)
Pt called to cancel her OV in Dec because she lost her [employer] and is trying to get it approved. She rescheduled to late January. She is worried about not being able to afford her Hep C treatments and asked if we could give her more Linzess samples and she would be by on Monday to pick up. I told her that I would mail her a [place] form to see if she could qualify for that to help her out financially until she can get her medicaid back.

## 2017-01-31 NOTE — Telephone Encounter (Signed)
Spoke with pt and samples are up front for pt. Pt will pick them up.

## 2017-02-07 ENCOUNTER — Telehealth: Payer: Self-pay | Admitting: Internal Medicine

## 2017-02-07 ENCOUNTER — Other Ambulatory Visit: Payer: Self-pay

## 2017-02-07 NOTE — Telephone Encounter (Signed)
pts pharmacy of choice updated to [facility] in [place], [place]

## 2017-02-07 NOTE — Telephone Encounter (Signed)
Pts pharmacy updated.

## 2017-02-07 NOTE — Telephone Encounter (Signed)
Pt called to say that all of her prescriptions are to go to [facility] in [place].

## 2017-02-08 ENCOUNTER — Ambulatory Visit: Payer: Medicaid Other | Admitting: Gastroenterology

## 2017-02-09 ENCOUNTER — Ambulatory Visit: Payer: Medicaid Other | Admitting: Gastroenterology

## 2017-02-22 ENCOUNTER — Telehealth: Payer: Self-pay | Admitting: Internal Medicine

## 2017-02-22 NOTE — Telephone Encounter (Signed)
Spoke with pt, samples were left up front for pt. Pt needs to f/u as directed in last ov notes to start the process for Hep C treatment.

## 2017-02-22 NOTE — Telephone Encounter (Signed)
[phone] patient called and stated she needed more linzess samples and she also needs to know when to start her hep c treatment.  Please call her, do not leave a message because she doesn't now how to check it.

## 2017-02-22 NOTE — Telephone Encounter (Signed)
Spoke with pt. Pt wants samples of Linzess 290 mcg. Samples were left up front for pt. Pt will call back to discuss her hep c treatment, pt was busy at the moment.

## 2017-03-07 ENCOUNTER — Telehealth: Payer: Self-pay | Admitting: Internal Medicine

## 2017-03-07 NOTE — Telephone Encounter (Signed)
Pt notified that she can use otc Miralax up to four times daily. Pt is aware that when she is able to come back to the office, she can discuss medication further.

## 2017-03-07 NOTE — Telephone Encounter (Signed)
Pt called to say that she was out of Linzess samples and they weren't helping her because she hasn't had a BM in 8 days. She said that she was taking her friend's medication of amitiza and she still doesn't have her medicaid yet and needs something, but not a prescription. I told her that I would let the nurse know the linzess wasn't helping and do we have samples of something else she could try. Pt has OV coming up on 1/25. Please advise and call her at [phone]

## 2017-03-07 NOTE — Telephone Encounter (Signed)
Spoke with pt. Pt had several concerns. 1. Pt hasn't had a bowel movement in 8 days. Pt was taking Linzess 290 and felt it wasn't working as well. Pt ran out of samples 4 days ago and added an otc stool softener. Pt mentioned that a friend had given her Amitiza and pt would like to try samples.   2. Pt is scheduled for 03/23/17 and isn't going to be able to keep appointment due to not having her insurance at this time. Pt is aware that new medication changes could have been discussed on 03/23/17.   3. Pt also states she spoke with a doctor at [facility] center about her Hep B series that told her they can get her Hep B vaccines covered. In the last ov note, pt was suppose to return to our office to discuss the Hep vaccines. Pt continued to say that she can't come in due to the cost.   Please advise.

## 2017-03-07 NOTE — Telephone Encounter (Signed)
Patient called back to let AM know the phone numbers of getting her medication. [phone] and Mellody Drown [phone]. Patient called back again asking more questions about having a UTI. I told her the nurse would call her.

## 2017-03-07 NOTE — Telephone Encounter (Signed)
Sporadic use of samples and other peoples prescriptions is not the long-term approach for management of constipation.  For long-term management, I recommend she get MiraLAX and take up to 4 doses daily as needed to facilitate bowel function. We'll be glad to see her back when she can make it.

## 2017-03-13 ENCOUNTER — Telehealth: Payer: Self-pay | Admitting: Obstetrics & Gynecology

## 2017-03-13 ENCOUNTER — Telehealth: Payer: Self-pay

## 2017-03-13 ENCOUNTER — Other Ambulatory Visit: Payer: Self-pay | Admitting: Obstetrics & Gynecology

## 2017-03-13 MED ORDER — TOLTERODINE TARTRATE ER 4 MG PO CP24: 4.0000 mg | ORAL_CAPSULE | Freq: Every day | ORAL | 11 refills | Status: DC

## 2017-03-13 NOTE — Telephone Encounter (Signed)
LMOVM

## 2017-03-13 NOTE — Telephone Encounter (Signed)
Paper script is done and to be called in

## 2017-03-13 NOTE — Telephone Encounter (Signed)
LMOVM returning patient's call.  

## 2017-03-13 NOTE — Telephone Encounter (Signed)
Prescription faxed. Patient informed.

## 2017-03-13 NOTE — Telephone Encounter (Signed)
Patient called stating her Medicaid is not active at this time but should be in the next couple of months. She was getting Oxybutin through them but is now using "[facility]" that helps people with low income/no insurance to get their medications. What she has now is not covered by them and is $25 a bottle. Says they will pay for toltgerodine(detrol) if can be prescribed.  If so, I will need to call in verbal order. Just need medication dosage/instructions, etc.  PLease advise.   [phone]

## 2017-03-13 NOTE — Telephone Encounter (Signed)
Pt called the office to discuss not being able to completely empty herself out when having a bowel movement. Pt drunk two magnesium citrate and only was able to have a small bowel movement. Pt was asked if she had started the Miralax as directed. Pt said she started the miralax 2-3 days ago. Pt is aware for a long term management to help with constipation, she was asked to start the miralax as discussed a few days ago when pt called, increase physical activity, increase water intake.  Pt called the [facility] and the Doctor wanted to see pt to prescribe her a medication.   Pt continued to ask about the Hep B series. Pt has an appointment next week, pt was asked to keep appointment if able. If pt isn't able to keep appointment due to not having insurance, she should go back for care at the [facility] until her insurance is active.   Please advise if there is anything else pt needs to do.

## 2017-03-14 NOTE — Telephone Encounter (Signed)
Too many telephone encounters. This is suboptimal for pt care. Patient needs a follow-up appointment with [place] before making any further recommendations regarding treatment of constipation one where the other.

## 2017-03-14 NOTE — Telephone Encounter (Signed)
noted 

## 2017-03-14 NOTE — Telephone Encounter (Signed)
Reviewed

## 2017-03-14 NOTE — Telephone Encounter (Signed)
Noted, pt notified.  

## 2017-03-23 ENCOUNTER — Ambulatory Visit: Payer: Self-pay | Admitting: Gastroenterology

## 2017-04-30 ENCOUNTER — Ambulatory Visit: Payer: Self-pay | Admitting: Gastroenterology

## 2017-05-08 ENCOUNTER — Telehealth: Payer: Self-pay | Admitting: Internal Medicine

## 2017-05-08 NOTE — Telephone Encounter (Signed)
Pt is trying to get retroactive medicaid and has a hearing on March 26. She is asking for a letter stating that she needs her Hep C treatments. [phone]

## 2017-05-10 NOTE — Telephone Encounter (Signed)
RMM, pt wants a letter mailed to hear home for her Medicaid hearing. Pt is trying to get medicaid and wants a letter stating her last appointment in our office and hep c treatment needed. Is it ok to write letter?

## 2017-05-10 NOTE — Telephone Encounter (Signed)
Spoke with pt, pt would like a letter mailed to her home.

## 2017-05-11 NOTE — Telephone Encounter (Signed)
A letter is okay strictly summarizing the assessment and recommendations from our encounter. Of course, at patient's written request.

## 2017-05-17 ENCOUNTER — Telehealth: Payer: Self-pay | Admitting: Internal Medicine

## 2017-05-17 NOTE — Telephone Encounter (Signed)
Patient called inquiring if you mailed out her letter.

## 2017-05-23 DIAGNOSIS — Z029 Encounter for administrative examinations, unspecified: Secondary | ICD-10-CM

## 2017-06-06 ENCOUNTER — Ambulatory Visit: Payer: Self-pay | Admitting: Gastroenterology

## 2017-06-26 ENCOUNTER — Ambulatory Visit (INDEPENDENT_AMBULATORY_CARE_PROVIDER_SITE_OTHER): Payer: Self-pay | Admitting: Orthopedic Surgery

## 2017-06-26 ENCOUNTER — Encounter: Payer: Self-pay | Admitting: Orthopedic Surgery

## 2017-06-26 VITALS — BP 133/85 | HR 93 | Ht 62.0 in | Wt 158.0 lb

## 2017-06-26 DIAGNOSIS — M47816 Spondylosis without myelopathy or radiculopathy, lumbar region: Secondary | ICD-10-CM

## 2017-06-26 DIAGNOSIS — M4317 Spondylolisthesis, lumbosacral region: Secondary | ICD-10-CM
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
## 2017-06-26 NOTE — Patient Instructions (Signed)
Spondylolisthesis Spondylolisthesis is when one of the bones in the spine (vertebrae) slips forward and out of place. This most commonly occurs in the lower back (lumbar spine), but it can happen anywhere along the spine. A vertebra may move out of place due to a previous back injury. In some cases, the injury may go unnoticed until the vertebra moves out of place later in life. Spondylolisthesis may also be caused by an injury or a condition that affects the bones. What are the causes? This condition may be caused by:  Injury (trauma). This is often a result of doing sports or physical activities that: ? Put a lot of strain on the bones in the lower back. ? Involve repetitive overstretching (hyperextension) of the spine.  A condition that affects the bones, such as osteoarthritis or cancer.  Changes in the spine that happen due to age-related wear and tear.  What increases the risk? The following factors may make you more likely to develop this condition:  Participating in sports or activities that put a lot of strain on the lower back, including: ? Gymnastics. ? Figure skating. ? Weight lifting. ? Football.  Having a condition that affects the bones.  Being older than age 62.  What are the signs or symptoms? Symptoms of this condition may include:  Mild to severe pain in the legs, lower back, or buttocks.  An abnormal way of walking (abnormal gait).  Poor posture.  Muscle stiffness, specifically in the hamstrings. The hamstrings are in the backs of the thighs.  Weakness, numbness, or a tingling sensation in the legs.  Symptoms may get worse when standing, and they may temporarily get better when sitting down or bending forward. In some cases, there may be no symptoms of this condition. How is this diagnosed?  This condition may be diagnosed based on:  Your symptoms.  Your medical history.  A physical exam. ? Your health care provider may push on certain areas to  determine the source of your pain. ? You may be asked to bend forward, backward, and side to side so your health care provider can assess the severity of your pain and your range of motion.  Imaging tests, such as: ? X-rays. ? CT scan. ? MRI.  How is this treated? Treatment for this condition may include:  Resting. This may involve avoiding or modifying activities that put strain on your back until your symptoms improve.  Medicines to help relieve pain.  NSAIDs to help reduce swelling and discomfort.  Injections of medicine (cortisone) in your back. These injections can to help relieve pain and numbness.  A brace to stabilize and support your back.  Physical therapy. This may include working with an occupational therapist or physical therapist who can teach you how to reduce pressure on your back while you do everyday activities.  Surgery. This may be needed if: ? Other treatment methods do not improve your condition. ? Your symptoms do not go away after 3-6 months. ? You are unable to walk or stand. ? You have severe pain.  Follow these instructions at home: If you have a brace:  Wear it as told by your health care provider. Remove it only as told by your health care provider.  Do not let your brace get wet if it is not waterproof.  Keep the brace clean. Driving  Do not drive or operate heavy machinery until you know how your pain medicine affects you.  Ask your health care provider when it  is safe to drive if you have a back brace. Activity  Rest and return to your normal activities as told by your health care provider. Ask your health care provider what activities are safe for you.  Avoid activities that take a lot of effort (are strenuous) for as long as told by your health care provider.  Do exercises as told by your health care provider. General instructions  Take over-the-counter and prescription medicines only as told by your health care provider.  If you  have questions or concerns about safety while taking pain medicine, talk with your health care provider.  Do not use any tobacco products, such as cigarettes, chewing tobacco, and e-cigarettes. Tobacco can delay bone healing. If you need help quitting, ask your health care provider.  Keep all follow-up visits as told by your health care provider. This is important. How is this prevented?  Warm up and stretch before being active.  Cool down and stretch after being active.  Give your body time to rest between periods of activity.  Make sure to use equipment that fits you.  Be safe and responsible while being active to avoid falls.  Do at least 150 minutes of moderate-intensity exercise each week, such as brisk walking or water aerobics.  Maintain physical fitness, including: ? Strength. ? Flexibility. ? Cardiovascular fitness. ? Endurance. Contact a health care provider if:  You have pain that gets worse or does not get better. Get help right away if:  You have severe back pain.  You develop weakness or numbness in your legs.  You are unable to stand or walk. This information is not intended to replace advice given to you by your health care provider. Make sure you discuss any questions you have with your health care provider. Document Released: 02/13/2005 Document Revised: 10/21/2015 Document Reviewed: 11/24/2014 Elsevier Interactive Patient Education  [employer].

## 2017-07-02 ENCOUNTER — Ambulatory Visit (HOSPITAL_COMMUNITY)
Admission: RE | Admit: 2017-07-02 | Discharge: 2017-07-02 | Disposition: A | Discharge status: Home / Self Care | Payer: Self-pay | Source: Ambulatory Visit | Attending: Orthopedic Surgery | Admitting: Orthopedic Surgery

## 2017-07-02 DIAGNOSIS — M47816 Spondylosis without myelopathy or radiculopathy, lumbar region: Secondary | ICD-10-CM | POA: Insufficient documentation

## 2017-07-05 ENCOUNTER — Ambulatory Visit: Payer: Self-pay | Admitting: Orthopedic Surgery

## 2017-07-09 ENCOUNTER — Telehealth: Payer: Self-pay | Admitting: Radiology

## 2017-07-09 ENCOUNTER — Ambulatory Visit (INDEPENDENT_AMBULATORY_CARE_PROVIDER_SITE_OTHER): Payer: Self-pay | Admitting: Orthopedic Surgery

## 2017-07-09 VITALS — BP 136/84 | HR 91 | Ht 62.0 in | Wt 158.0 lb

## 2017-07-09 DIAGNOSIS — M47816 Spondylosis without myelopathy or radiculopathy, lumbar region: Secondary | ICD-10-CM

## 2017-07-09 DIAGNOSIS — M4317 Spondylolisthesis, lumbosacral region: Secondary | ICD-10-CM

## 2017-07-09 NOTE — Telephone Encounter (Signed)
I gave patient the number for [facility] to call them about where she can go for the injections. [phone]. We normally send to [facility], but they are not part of [facility].
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
## 2017-07-09 NOTE — Patient Instructions (Signed)
Epidural Steroid Injection  An epidural steroid injection is a shot of steroid medicine and numbing medicine that is given into the space between the spinal cord and the bones in your back (epidural space). The shot helps relieve pain caused by an irritated or swollen nerve root.  The amount of pain relief you get from the injection depends on what is causing the nerve to be swollen and irritated, and how long your pain lasts. You are more likely to benefit from this injection if your pain is strong and comes on suddenly rather than if you have had pain for a long time.  Tell a health care provider about:  · Any allergies you have.  · All medicines you are taking, including vitamins, herbs, eye drops, creams, and over-the-counter medicines.  · Any problems you or family members have had with anesthetic medicines.  · Any blood disorders you have.  · Any surgeries you have had.  · Any medical conditions you have.  · Whether you are pregnant or may be pregnant.  What are the risks?  Generally, this is a safe procedure. However, problems may occur, including:  · Headache.  · Bleeding.  · Infection.  · Allergic reaction to medicines.  · Damage to your nerves.    What happens before the procedure?  Staying hydrated  Follow instructions from your health care provider about hydration, which may include:  · Up to 2 hours before the procedure - you may continue to drink clear liquids, such as water, clear fruit juice, black coffee, and plain tea.    Eating and drinking restrictions  Follow instructions from your health care provider about eating and drinking, which may include:  · 8 hours before the procedure - stop eating heavy meals or foods such as meat, fried foods, or fatty foods.  · 6 hours before the procedure - stop eating light meals or foods, such as toast or cereal.  · 6 hours before the procedure - stop drinking milk or drinks that contain milk.  · 2 hours before the procedure - stop drinking clear  liquids.    Medicine  · You may be given medicines to lower anxiety.  · Ask your health care provider about:  ? Changing or stopping your regular medicines. This is especially important if you are taking diabetes medicines or blood thinners.  ? Taking medicines such as aspirin and ibuprofen. These medicines can thin your blood. Do not take these medicines before your procedure if your health care provider instructs you not to.  General instructions  · Plan to have someone take you home from the hospital or clinic.  What happens during the procedure?  · You may receive a medicine to help you relax (sedative).  · You will be asked to lie on your abdomen.  · The injection site will be cleaned.  · A numbing medicine (local anesthetic) will be used to numb the injection site.  · A needle will be inserted through your skin into the epidural space. You may feel some discomfort when this happens. An X-ray machine will be used to make sure the needle is put as close as possible to the affected nerve.  · A steroid medicine and a local anesthetic will be injected into the epidural space.  · The needle will be removed.  · A bandage (dressing) will be put over the injection site.  What happens after the procedure?  · Your blood pressure, heart rate, breathing rate, and blood   oxygen level will be monitored until the medicines you were given have worn off.  · Your arm or leg may feel weak or numb for a few hours.  · The injection site may feel sore.  · Do not drive for 24 hours if you received a sedative.  This information is not intended to replace advice given to you by your health care provider. Make sure you discuss any questions you have with your health care provider.  Document Released: 05/23/2007 Document Revised: 07/28/2015 Document Reviewed: 06/01/2015  Elsevier Interactive Patient Education © 2018 Elsevier Inc.

## 2017-07-11 ENCOUNTER — Telehealth: Payer: Self-pay | Admitting: Orthopedic Surgery

## 2017-07-11 NOTE — Telephone Encounter (Signed)
I have saved the pharmacy, can you send in the Gabapentin you discussed with her in the office?

## 2017-07-11 NOTE — Telephone Encounter (Signed)
Patient called about prescription for Neurontin, states it is to be ordered through [facility], per office visit Monday, 07/09/17.  Please advise - ph# [phone].  For med assist, we will need to print and fax

## 2017-07-11 NOTE — Telephone Encounter (Signed)
Patient called about prescription for Neurontin, states it is to be ordered through [facility], per office visit Monday, 07/09/17.  Please advise - ph# [phone].

## 2017-07-11 NOTE — Telephone Encounter (Signed)
I spoke to [name] and per our conversation, she states the [facility] number is correct  Please send prescriptuion on for her.

## 2017-07-12 ENCOUNTER — Other Ambulatory Visit: Payer: Self-pay | Admitting: Orthopedic Surgery

## 2017-07-12 DIAGNOSIS — M4317 Spondylolisthesis, lumbosacral region: Secondary | ICD-10-CM

## 2017-07-12 MED ORDER — GABAPENTIN 100 MG PO CAPS: 100.0000 mg | ORAL_CAPSULE | Freq: Three times a day (TID) | ORAL | 2 refills | Status: DC

## 2017-07-16 ENCOUNTER — Other Ambulatory Visit: Payer: Self-pay | Admitting: Obstetrics & Gynecology

## 2017-08-06 ENCOUNTER — Telehealth: Payer: Self-pay | Admitting: *Deleted

## 2017-08-06 NOTE — Telephone Encounter (Signed)
Pt called stating that her Medicaid ran out and she wont be able to get back in the office for an appt until she gets it straightened out. She states that she will need refills on her estradiol. Informed pt that 11 refills had been sent to [facility] on 07/17/17 and that she should check with the pharmacy on that. Pt verbalized understanding.

## 2017-08-09 ENCOUNTER — Ambulatory Visit: Payer: Self-pay | Admitting: Gastroenterology

## 2017-09-10 ENCOUNTER — Telehealth: Payer: Self-pay | Admitting: *Deleted

## 2017-09-10 ENCOUNTER — Telehealth: Payer: Self-pay | Admitting: Obstetrics & Gynecology

## 2017-09-10 MED ORDER — SULFAMETHOXAZOLE-TRIMETHOPRIM 800-160 MG PO TABS: 1.0000 | ORAL_TABLET | Freq: Two times a day (BID) | ORAL | 0 refills | Status: DC

## 2017-09-10 NOTE — Telephone Encounter (Signed)
Bactrim ds was e prescribed per request

## 2017-09-10 NOTE — Telephone Encounter (Signed)
Called patient to let her know medication was sent but unable to leave message.

## 2017-09-10 NOTE — Telephone Encounter (Signed)
Patient called very tearful stating she has an UTI and is wanting an antibiotic sent in. Says Medicaid has not become active yet. She is experiencing burning with urination and back pain. If anything can be sent, requesting it go to [facility]. Please advise.

## 2017-09-20 ENCOUNTER — Encounter: Payer: Self-pay | Admitting: Cardiology
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
## 2017-09-24 ENCOUNTER — Ambulatory Visit: Payer: Self-pay | Admitting: Cardiology

## 2017-10-02 ENCOUNTER — Ambulatory Visit: Payer: Self-pay | Admitting: Obstetrics & Gynecology

## 2017-10-02 ENCOUNTER — Telehealth: Payer: Self-pay | Admitting: Orthopedic Surgery

## 2017-10-02 NOTE — Telephone Encounter (Signed)
Patient called asking about being sent to have injections in her back.Stated that Dr. Aline Brochure was going to get it set up for her.  Please call and advise [phone]. Patient states her phone is messed up so when you call don't leave a message because she has to answer and she can't call out.

## 2017-10-02 NOTE — Telephone Encounter (Signed)
Plan was for her to discuss with [facility] care where she can go for the ESI's   They will have to let her know who will do these for her, since we do not do those here.   She will call to find out.

## 2017-10-03 ENCOUNTER — Telehealth: Payer: Self-pay | Admitting: Orthopedic Surgery

## 2017-10-03 DIAGNOSIS — M4317 Spondylolisthesis, lumbosacral region: Secondary | ICD-10-CM

## 2017-10-03 NOTE — Telephone Encounter (Signed)
Patient is asking for you to give her a call. She is wanting to let you know what [place] told her.   Please call and advise. Again, if you call do not leave a message cause her phone is messed up, she will need to answer.

## 2017-10-03 NOTE — Telephone Encounter (Signed)
I called patient about this. Her charity care does not cover ESI's, she wants to know if you will refer to pain management  Please advise (she is aware will be next week when I call her bak)

## 2017-10-05 NOTE — Telephone Encounter (Signed)
yes

## 2017-10-05 NOTE — Telephone Encounter (Signed)
I called patient to advise referral made, will take several weeks, the pain clinic is at [facility].

## 2017-10-08 ENCOUNTER — Telehealth: Payer: Self-pay | Admitting: Obstetrics & Gynecology

## 2017-10-08 NOTE — Telephone Encounter (Signed)
Pt called asking if we had samples of estradiol or provera. Advised that we had neither in the office. She states that she needs refills. Advised that 11 refills were sent in May 2019 and she should check with her pharmacy. Pt verbalized understanding.

## 2017-10-09 ENCOUNTER — Telehealth: Payer: Self-pay | Admitting: Obstetrics & Gynecology

## 2017-10-09 ENCOUNTER — Other Ambulatory Visit: Payer: Self-pay | Admitting: Obstetrics & Gynecology

## 2017-10-09 ENCOUNTER — Telehealth: Payer: Self-pay | Admitting: Orthopedic Surgery

## 2017-10-09 ENCOUNTER — Ambulatory Visit (INDEPENDENT_AMBULATORY_CARE_PROVIDER_SITE_OTHER): Payer: Self-pay | Admitting: Obstetrics & Gynecology

## 2017-10-09 ENCOUNTER — Encounter: Payer: Self-pay | Admitting: Obstetrics & Gynecology

## 2017-10-09 VITALS — BP 129/79 | HR 105 | Ht 64.0 in | Wt 161.0 lb

## 2017-10-09 DIAGNOSIS — N390 Urinary tract infection, site not specified: Secondary | ICD-10-CM

## 2017-10-09 DIAGNOSIS — Z1231 Encounter for screening mammogram for malignant neoplasm of breast: Secondary | ICD-10-CM

## 2017-10-09 LAB — POCT URINALYSIS DIPSTICK
Blood, UA: NEGATIVE
GLUCOSE UA: NEGATIVE
Ketones, UA: NEGATIVE
LEUKOCYTES UA: NEGATIVE
Nitrite, UA: NEGATIVE
Protein, UA: NEGATIVE

## 2017-10-09 MED ORDER — CEPHALEXIN 500 MG PO CAPS: ORAL_CAPSULE | ORAL | 2 refills | Status: DC

## 2017-10-09 MED ORDER — CEPHALEXIN 500 MG PO CAPS: 500.0000 mg | ORAL_CAPSULE | Freq: Three times a day (TID) | ORAL | 2 refills | Status: DC

## 2017-10-09 NOTE — Telephone Encounter (Signed)
Patient called stating that she seen Dr. Elonda Husky today and he prescribed her a medication hat was sent to [facility], pt states that it cost her $15.00. Pt states that she is part of a program [employer] and it cover that medication at no cost to her. Pt gave the number to the place its [phone]. Please contact pt when done

## 2017-10-09 NOTE — Telephone Encounter (Signed)
Patient called wanting to let us know that the pain clinic at [facility] told her that we would need to call [place] transportation to get them [place]. I spoke with Amy and she stated that her PCP could do this. I spoke with Shali again and she had questions about other pain management clinics. After talking with Amy again I found out that the clinics in question are not in [facility]. I suggested that maybe she had a friend, family member or church friend that could help her with transportation. She was in agreement with this suggestion.
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
## 2017-10-10 ENCOUNTER — Other Ambulatory Visit: Payer: Self-pay | Admitting: Obstetrics & Gynecology

## 2017-10-10 MED ORDER — CEPHALEXIN 500 MG PO CAPS: ORAL_CAPSULE | ORAL | 2 refills | Status: DC

## 2017-10-10 NOTE — Telephone Encounter (Signed)
done

## 2017-10-18 ENCOUNTER — Ambulatory Visit (HOSPITAL_COMMUNITY)
Admission: RE | Admit: 2017-10-18 | Discharge: 2017-10-18 | Disposition: A | Discharge status: Home / Self Care | Payer: Self-pay | Source: Ambulatory Visit | Attending: Obstetrics & Gynecology | Admitting: Obstetrics & Gynecology

## 2017-10-18 DIAGNOSIS — Z1231 Encounter for screening mammogram for malignant neoplasm of breast: Secondary | ICD-10-CM | POA: Insufficient documentation

## 2017-10-31 ENCOUNTER — Telehealth: Payer: Self-pay

## 2017-10-31 ENCOUNTER — Ambulatory Visit (INDEPENDENT_AMBULATORY_CARE_PROVIDER_SITE_OTHER): Payer: Self-pay | Admitting: Gastroenterology

## 2017-10-31 ENCOUNTER — Other Ambulatory Visit: Payer: Self-pay

## 2017-10-31 ENCOUNTER — Encounter: Payer: Self-pay | Admitting: Gastroenterology

## 2017-10-31 ENCOUNTER — Encounter: Payer: Self-pay | Admitting: Internal Medicine

## 2017-10-31 VITALS — BP 137/88 | HR 107 | Temp 97.8°F | Ht 62.0 in | Wt 159.6 lb

## 2017-10-31 DIAGNOSIS — K625 Hemorrhage of anus and rectum: Secondary | ICD-10-CM

## 2017-10-31 DIAGNOSIS — Z8619 Personal history of other infectious and parasitic diseases: Secondary | ICD-10-CM | POA: Insufficient documentation

## 2017-10-31 DIAGNOSIS — K921 Melena: Secondary | ICD-10-CM

## 2017-10-31 NOTE — Telephone Encounter (Signed)
Called and informed pt of pre-op appt 11/21/17 at 12:45pm. Pt will come by office to pick up her sample TCS prep. Appt letter placed with her prep sample.

## 2017-10-31 NOTE — Assessment & Plan Note (Signed)
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 No recent ultrasound or labs completed; however, she tells me that she completed an 8-week-course of Mavyret through PCP just a few weeks ago. Denies missing any doses. Hopefully, she will be eradicated, and we will follow RNA over next few months. Regardless, she needs labs now to get a baseline of where we are; she also has had exposure to Hep B via natural infection and we need to ensure LFTs are remaining normal as there is a low risk of reactivation during Hep C treatment. This is low likelihood, but ideally would have been followed closely during treatment. Needs elastography as well.   Hep C RNA, HFP, BMP, INR today. elastography in near future WIll check Hep B DNA if elevated LFTs Will need repeat labs in 3 months along with viral load
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
## 2017-10-31 NOTE — Progress Notes (Signed)
CC'D TO PCP °

## 2017-10-31 NOTE — Assessment & Plan Note (Signed)
57 year old female with rectal bleeding intermittently in setting of constipation, known history of internal hemorrhoids s/p banding in 2014/2015 and then again with right posterior in Oct 2018. Last colonoscopy 2013. Would recommend diagnostic colonoscopy to exclude occult etiology and consider banding later if amenable.   Proceed with TCS with Dr. [name] in near future: the risks, benefits, and alternatives have been discussed with the patient in detail. The patient states understanding and desires to proceed. Propofol due to polypharmacy Start Linzess 290 mcg once daily: patient to call with report Extra day of clear liquids prior due to chronic constipation Return 10/31

## 2017-10-31 NOTE — Patient Instructions (Signed)
We have arranged a colonoscopy in the near future with Dr. [name]. You will need to have an extra day of clear liquids to help prep the best.  For constipation: I have given Linzess capsules. Take this first thing in the morning on an empty stomach, 30 minutes before breakfast.  Please have blood work done today if you are able: you can go to [facility] lab to do this.   We will see you back in October!  We will be in touch with the results of the labs soon!  It was a pleasure to see you today. I strive to create trusting relationships with patients to provide genuine, compassionate, and quality care. I value your feedback. If you receive a survey regarding your visit,  I greatly appreciate you taking time to fill this out.   [name], PhD, ANP-BC [facility]

## 2017-11-01 ENCOUNTER — Telehealth: Payer: Self-pay | Admitting: *Deleted

## 2017-11-03 LAB — CBC WITH DIFFERENTIAL/PLATELET
BASOS ABS: 48 {cells}/uL (ref 0–200)
BASOS PCT: 0.8 %
EOS PCT: 1.5 %
Eosinophils Absolute: 90 cells/uL (ref 15–500)
HEMATOCRIT: 38.5 % (ref 35.0–45.0)
HEMOGLOBIN: 12.9 g/dL (ref 11.7–15.5)
LYMPHS ABS: 1878 {cells}/uL (ref 850–3900)
MCH: 28.9 pg (ref 27.0–33.0)
MCHC: 33.5 g/dL (ref 32.0–36.0)
MCV: 86.1 fL (ref 80.0–100.0)
MONOS PCT: 4.5 %
MPV: 10.5 fL (ref 7.5–12.5)
NEUTROS ABS: 3714 {cells}/uL (ref 1500–7800)
Neutrophils Relative %: 61.9 %
Platelets: 232 10*3/uL (ref 140–400)
RBC: 4.47 10*6/uL (ref 3.80–5.10)
RDW: 12.9 % (ref 11.0–15.0)
Total Lymphocyte: 31.3 %
WBC mixed population: 270 cells/uL (ref 200–950)
WBC: 6 10*3/uL (ref 3.8–10.8)

## 2017-11-03 LAB — BASIC METABOLIC PANEL WITH GFR
BUN: 9 mg/dL (ref 7–25)
CALCIUM: 9.3 mg/dL (ref 8.6–10.4)
CO2: 28 mmol/L (ref 20–32)
CREATININE: 0.79 mg/dL (ref 0.50–1.05)
Chloride: 105 mmol/L (ref 98–110)
GFR, EST NON AFRICAN AMERICAN: 84 mL/min/{1.73_m2} (ref >=60)
GFR, Est African American: 97 mL/min/{1.73_m2} (ref >=60)
GLUCOSE: 88 mg/dL (ref 65–139)
Potassium: 4.4 mmol/L (ref 3.5–5.3)
SODIUM: 139 mmol/L (ref 135–146)

## 2017-11-03 LAB — HEPATIC FUNCTION PANEL
AG RATIO: 2 (calc) (ref 1.0–2.5)
ALBUMIN MSPROF: 4.4 g/dL (ref 3.6–5.1)
ALT: 15 U/L (ref 6–29)
AST: 19 U/L (ref 10–35)
Alkaline phosphatase (APISO): 63 U/L (ref 33–130)
BILIRUBIN TOTAL: 0.4 mg/dL (ref 0.2–1.2)
Bilirubin, Direct: 0.1 mg/dL (ref 0.0–0.2)
GLOBULIN: 2.2 g/dL (ref 1.9–3.7)
Indirect Bilirubin: 0.3 mg/dL (calc) (ref 0.2–1.2)
TOTAL PROTEIN: 6.6 g/dL (ref 6.1–8.1)

## 2017-11-03 LAB — PROTIME-INR
INR: 0.9
PROTHROMBIN TIME: 9.8 s (ref 9.0–11.5)

## 2017-11-03 LAB — HEPATITIS C RNA QUANTITATIVE
HCV Quantitative Log: <1.18 Log IU/mL — AB
HCV RNA, PCR, QN: DETECTED [IU]/mL — AB

## 2017-11-05 ENCOUNTER — Telehealth: Payer: Self-pay | Admitting: Internal Medicine

## 2017-11-05 NOTE — Telephone Encounter (Signed)
[phone] please call patient if her lab results are back

## 2017-11-07 ENCOUNTER — Ambulatory Visit (HOSPITAL_COMMUNITY)
Admission: RE | Admit: 2017-11-07 | Discharge: 2017-11-07 | Disposition: A | Discharge status: Home / Self Care | Payer: Self-pay | Source: Ambulatory Visit | Attending: Gastroenterology | Admitting: Gastroenterology

## 2017-11-07 ENCOUNTER — Encounter: Payer: Self-pay | Admitting: Cardiology

## 2017-11-07 DIAGNOSIS — Z8619 Personal history of other infectious and parasitic diseases: Secondary | ICD-10-CM | POA: Insufficient documentation
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
## 2017-11-08 ENCOUNTER — Ambulatory Visit (INDEPENDENT_AMBULATORY_CARE_PROVIDER_SITE_OTHER): Payer: Self-pay | Admitting: Cardiology

## 2017-11-08 ENCOUNTER — Other Ambulatory Visit: Payer: Self-pay

## 2017-11-08 ENCOUNTER — Encounter: Payer: Self-pay | Admitting: Cardiology

## 2017-11-08 ENCOUNTER — Encounter: Payer: Self-pay | Admitting: *Deleted

## 2017-11-08 VITALS — BP 132/68 | HR 88 | Ht 62.0 in | Wt 162.0 lb

## 2017-11-08 DIAGNOSIS — R011 Cardiac murmur, unspecified: Secondary | ICD-10-CM

## 2017-11-08 DIAGNOSIS — Z8249 Family history of ischemic heart disease and other diseases of the circulatory system: Secondary | ICD-10-CM

## 2017-11-08 DIAGNOSIS — I10 Essential (primary) hypertension: Secondary | ICD-10-CM

## 2017-11-08 DIAGNOSIS — R9431 Abnormal electrocardiogram [ECG] [EKG]: Secondary | ICD-10-CM

## 2017-11-08 DIAGNOSIS — E782 Mixed hyperlipidemia: Secondary | ICD-10-CM

## 2017-11-08 DIAGNOSIS — B182 Chronic viral hepatitis C: Secondary | ICD-10-CM

## 2017-11-08 DIAGNOSIS — Z720 Tobacco use: Secondary | ICD-10-CM

## 2017-11-08 DIAGNOSIS — R0602 Shortness of breath: Secondary | ICD-10-CM

## 2017-11-08 NOTE — Telephone Encounter (Signed)
Please see result note. I have already called and informed pt. Sent to [place] to nic [place] in 6 months.

## 2017-11-08 NOTE — Patient Instructions (Addendum)
Medication Instructions:   Your physician recommends that you continue on your current medications as directed. Please refer to the Current Medication list given to you today.  Labwork:  NONE  Testing/Procedures: Your physician has requested that you have an echocardiogram. Echocardiography is a painless test that uses sound waves to create images of your heart. It provides your doctor with information about the size and shape of your heart and how well your heart's chambers and valves are working. This procedure takes approximately one hour. There are no restrictions for this procedure. Your physician has requested that you have a lexiscan myoview. For further information please visit [email or web address]. Please follow instruction sheet, as given.  Follow-Up:  Your physician recommends that you schedule a follow-up appointment in: pending test results.  Any Other Special Instructions Will Be Listed Below (If Applicable).  If you need a refill on your cardiac medications before your next appointment, please call your pharmacy. 

## 2017-11-08 NOTE — Progress Notes (Signed)
PT is aware and lab orders on file for Dec 2019.

## 2017-11-08 NOTE — Progress Notes (Signed)
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 Repeat US in 6 months. She still has Hep C virus slightly detectable. This can happen but it does not mean she will not achieve cure: we need to pursue repeat HCV RNA in 3 months, along with HFP. Her LFTs are normal and if any jump in these, we need to check Hep B DNA.

## 2017-11-08 NOTE — Telephone Encounter (Signed)
[name]:  I sent result note to Elmo Putt, but you can tell her we need to recheck HCV RNA and HFP in 3 months. Still has small amount of detectable virus. I am hoping this will continue to clear. We will watch closely. Next ultrasound in 6 months due to elastography score of F2/F3.

## 2017-11-08 NOTE — Progress Notes (Signed)
ON RECALL  °

## 2017-11-08 NOTE — Progress Notes (Signed)
Pt is aware. Lab orders on file for 02/07/2018. Forwarding to [place] to nic the [place] in 6 months.

## 2017-11-08 NOTE — Telephone Encounter (Signed)
Forwarding to Anna Boone, NP for results.  

## 2017-11-12 ENCOUNTER — Ambulatory Visit (HOSPITAL_COMMUNITY): Payer: Self-pay | Admitting: Psychiatry

## 2017-11-15 ENCOUNTER — Ambulatory Visit: Payer: Self-pay | Admitting: Student in an Organized Health Care Education/Training Program

## 2017-11-20 ENCOUNTER — Encounter (HOSPITAL_COMMUNITY)
Admission: RE | Admit: 2017-11-20 | Discharge: 2017-11-20 | Disposition: A | Discharge status: Home / Self Care | Payer: Self-pay | Source: Ambulatory Visit | Attending: Cardiology | Admitting: Cardiology

## 2017-11-20 ENCOUNTER — Encounter (HOSPITAL_COMMUNITY): Payer: Self-pay | Admitting: Emergency Medicine

## 2017-11-20 ENCOUNTER — Ambulatory Visit (HOSPITAL_COMMUNITY): Admission: RE | Admit: 2017-11-20 | Payer: Self-pay | Source: Ambulatory Visit

## 2017-11-20 ENCOUNTER — Encounter (HOSPITAL_COMMUNITY): Payer: Self-pay

## 2017-11-20 ENCOUNTER — Other Ambulatory Visit: Payer: Self-pay

## 2017-11-20 ENCOUNTER — Emergency Department (HOSPITAL_COMMUNITY)
Admission: EM | Admit: 2017-11-20 | Discharge: 2017-11-20 | Disposition: A | Discharge status: Home / Self Care | Payer: Self-pay | Attending: Emergency Medicine | Admitting: Emergency Medicine

## 2017-11-20 DIAGNOSIS — I10 Essential (primary) hypertension: Secondary | ICD-10-CM | POA: Insufficient documentation

## 2017-11-20 DIAGNOSIS — M545 Low back pain: Secondary | ICD-10-CM | POA: Insufficient documentation

## 2017-11-20 DIAGNOSIS — Z79899 Other long term (current) drug therapy: Secondary | ICD-10-CM | POA: Insufficient documentation

## 2017-11-20 DIAGNOSIS — Z7982 Long term (current) use of aspirin: Secondary | ICD-10-CM | POA: Insufficient documentation

## 2017-11-20 DIAGNOSIS — R9431 Abnormal electrocardiogram [ECG] [EKG]: Secondary | ICD-10-CM | POA: Insufficient documentation

## 2017-11-20 DIAGNOSIS — R0602 Shortness of breath: Secondary | ICD-10-CM | POA: Insufficient documentation

## 2017-11-20 DIAGNOSIS — G8929 Other chronic pain: Secondary | ICD-10-CM | POA: Insufficient documentation

## 2017-11-20 DIAGNOSIS — F17210 Nicotine dependence, cigarettes, uncomplicated: Secondary | ICD-10-CM | POA: Insufficient documentation

## 2017-11-20 MED ORDER — TRAMADOL HCL 50 MG PO TABS: 50.0000 mg | ORAL_TABLET | Freq: Four times a day (QID) | ORAL | 0 refills | Status: DC | PRN

## 2017-11-20 MED ORDER — TRAMADOL HCL 50 MG PO TABS
50.0000 mg | ORAL_TABLET | Freq: Once | ORAL | Status: AC
  Administered 2017-11-20: 50 mg via ORAL
  Filled 2017-11-20: qty 1

## 2017-11-20 NOTE — Discharge Instructions (Signed)
You were evaluated today for your chronic lower back pain.  I have prescribed you a short course of Ultram tablet. Please take as prescribed. Please follow up with your Orthopedist for re-evaluation. Please keep your Pain clinic appointment that is on Thursday.  Please return to the ED with any new or worsening symptoms such as:  Contact a doctor if: You have pain that does not go away with rest or medicine. You have worsening pain that goes down into your legs or buttocks. You have pain that does not get better in one week. You have pain at night. You lose weight. You have a fever or chills. Get help right away if: You cannot control when you poop (bowel movement) or pee (urinate). Your arms or legs feel weak. Your arms or legs lose feeling (numbness). You feel sick to your stomach (nauseous) or throw up (vomit). You have belly (abdominal) pain. You feel like you may pass out (faint).

## 2017-11-20 NOTE — Pre-Procedure Instructions (Signed)
Called patient to go over times for TCS. "I am in the hospital now doing a stress-test. I don't think I can do this test though. I have back pain all the time and I think I am dehydrated. I am going to talk to this tech and tell her that." I asked her if she was still planning on having her colonoscopy done. She said she was.I attempted to go over instructions with her and she states, " I just can't do this right now. I can't concentrate". I printed instructions to patient and took them to her nurse to give them to her when her test is completed.

## 2017-11-20 NOTE — ED Triage Notes (Signed)
Pt states here today for lower back pain. Pt states was seen at [facility] and given toradol shot and pills but didn't get filled due to not helping from shot. Pt reports was referred to pain clinic in [facility] and was unable to keep due to not having a ride or money to pay any one.
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
## 2017-11-20 NOTE — Patient Instructions (Signed)
    [name]  11/20/2017     @PREFPERIOPPHARMACY @   Your procedure is scheduled on  11/26/2017.  Report to [facility] at  [place]   A.M.  Call this number if you have problems the morning of surgery:  [phone]   Remember:  Do not eat or drink after midnight.  You may drink clear liquids until  ( follow the instructions given to you) .  Clear liquids allowed are:                    Water, Juice (non-citric and without pulp), Carbonated beverages, Clear Tea, Black Coffee only, Plain Jell-O only, Gatorade and Plain Popsicles only    Take these medicines the morning of surgery with A SIP OF WATER  Xanax( if needed), amlodipine, adderall, zyrtec, flexaril, gabapentin, mobic, prilosec, zoloft.    Do not wear jewelry, make-up or nail polish.  Do not wear lotions, powders, or perfumes, or deodorant.  Do not shave 48 hours prior to surgery.  Men may shave face and neck.  Do not bring valuables to the hospital.  [facility] is not responsible for any belongings or valuables.  Contacts, dentures or bridgework may not be worn into surgery.  Leave your suitcase in the car.  After surgery it may be brought to your room.  For patients admitted to the hospital, discharge time will be determined by your treatment team.  Patients discharged the day of surgery will not be allowed to drive home.   Name and phone number of your driver:   [name] Special instructions:  Follow the diet and prep instructions given to you,  Please read over the following fact sheets that you were given. Anesthesia Post-op Instructions and Care and Recovery After Surgery

## 2017-11-21 ENCOUNTER — Encounter (HOSPITAL_COMMUNITY)
Admission: RE | Admit: 2017-11-21 | Discharge: 2017-11-21 | Disposition: A | Discharge status: Home / Self Care | Payer: Self-pay | Source: Ambulatory Visit | Attending: Internal Medicine | Admitting: Internal Medicine

## 2017-11-22 ENCOUNTER — Ambulatory Visit: Payer: Self-pay | Admitting: Student in an Organized Health Care Education/Training Program

## 2017-11-26 ENCOUNTER — Other Ambulatory Visit: Payer: Self-pay

## 2017-11-26 ENCOUNTER — Ambulatory Visit (HOSPITAL_COMMUNITY)
Admission: RE | Admit: 2017-11-26 | Discharge: 2017-11-26 | Disposition: A | Discharge status: Home / Self Care | Payer: Self-pay | Source: Ambulatory Visit | Attending: Internal Medicine | Admitting: Internal Medicine

## 2017-11-26 ENCOUNTER — Encounter (HOSPITAL_COMMUNITY): Payer: Self-pay | Admitting: *Deleted

## 2017-11-26 ENCOUNTER — Encounter (HOSPITAL_COMMUNITY)
Admission: RE | Disposition: A | Discharge status: Home / Self Care | Payer: Self-pay | Source: Ambulatory Visit | Attending: Internal Medicine

## 2017-11-26 ENCOUNTER — Ambulatory Visit (HOSPITAL_COMMUNITY): Payer: Self-pay | Admitting: Anesthesiology

## 2017-11-26 ENCOUNTER — Telehealth: Payer: Self-pay | Admitting: Internal Medicine

## 2017-11-26 DIAGNOSIS — E782 Mixed hyperlipidemia: Secondary | ICD-10-CM | POA: Insufficient documentation

## 2017-11-26 DIAGNOSIS — K644 Residual hemorrhoidal skin tags: Secondary | ICD-10-CM | POA: Insufficient documentation

## 2017-11-26 DIAGNOSIS — B1920 Unspecified viral hepatitis C without hepatic coma: Secondary | ICD-10-CM | POA: Insufficient documentation

## 2017-11-26 DIAGNOSIS — Z880 Allergy status to penicillin: Secondary | ICD-10-CM | POA: Insufficient documentation

## 2017-11-26 DIAGNOSIS — D122 Benign neoplasm of ascending colon: Secondary | ICD-10-CM | POA: Insufficient documentation

## 2017-11-26 DIAGNOSIS — Z7982 Long term (current) use of aspirin: Secondary | ICD-10-CM | POA: Insufficient documentation

## 2017-11-26 DIAGNOSIS — F419 Anxiety disorder, unspecified: Secondary | ICD-10-CM | POA: Insufficient documentation

## 2017-11-26 DIAGNOSIS — Z8249 Family history of ischemic heart disease and other diseases of the circulatory system: Secondary | ICD-10-CM | POA: Insufficient documentation

## 2017-11-26 DIAGNOSIS — K5730 Diverticulosis of large intestine without perforation or abscess without bleeding: Secondary | ICD-10-CM | POA: Insufficient documentation

## 2017-11-26 DIAGNOSIS — F17210 Nicotine dependence, cigarettes, uncomplicated: Secondary | ICD-10-CM | POA: Insufficient documentation

## 2017-11-26 DIAGNOSIS — I69311 Memory deficit following cerebral infarction: Secondary | ICD-10-CM | POA: Insufficient documentation

## 2017-11-26 DIAGNOSIS — K219 Gastro-esophageal reflux disease without esophagitis: Secondary | ICD-10-CM | POA: Insufficient documentation

## 2017-11-26 DIAGNOSIS — F609 Personality disorder, unspecified: Secondary | ICD-10-CM | POA: Insufficient documentation

## 2017-11-26 DIAGNOSIS — D123 Benign neoplasm of transverse colon: Secondary | ICD-10-CM | POA: Insufficient documentation

## 2017-11-26 DIAGNOSIS — K921 Melena: Secondary | ICD-10-CM | POA: Insufficient documentation

## 2017-11-26 DIAGNOSIS — K642 Third degree hemorrhoids: Secondary | ICD-10-CM | POA: Insufficient documentation

## 2017-11-26 DIAGNOSIS — I10 Essential (primary) hypertension: Secondary | ICD-10-CM | POA: Insufficient documentation

## 2017-11-26 DIAGNOSIS — M1990 Unspecified osteoarthritis, unspecified site: Secondary | ICD-10-CM | POA: Insufficient documentation

## 2017-11-26 DIAGNOSIS — R0602 Shortness of breath: Secondary | ICD-10-CM | POA: Insufficient documentation

## 2017-11-26 DIAGNOSIS — Z79899 Other long term (current) drug therapy: Secondary | ICD-10-CM | POA: Insufficient documentation

## 2017-11-26 HISTORY — PX: COLONOSCOPY WITH PROPOFOL: SHX5780

## 2017-11-26 HISTORY — PX: POLYPECTOMY: SHX5525

## 2017-11-26 SURGERY — COLONOSCOPY WITH PROPOFOL
Anesthesia: Monitor Anesthesia Care

## 2017-11-26 MED ORDER — MIDAZOLAM HCL 5 MG/5ML IJ SOLN
INTRAMUSCULAR | Status: DC | PRN
  Administered 2017-11-26: 2 mg via INTRAVENOUS

## 2017-11-26 MED ORDER — MEPERIDINE HCL 100 MG/ML IJ SOLN: 6.2500 mg | INTRAMUSCULAR | Status: DC | PRN

## 2017-11-26 MED ORDER — HYDROCODONE-ACETAMINOPHEN 7.5-325 MG PO TABS: 1.0000 | ORAL_TABLET | Freq: Once | ORAL | Status: DC | PRN

## 2017-11-26 MED ORDER — LACTATED RINGERS IV SOLN
INTRAVENOUS | Status: DC
  Administered 2017-11-26: 12:00:00 via INTRAVENOUS

## 2017-11-26 MED ORDER — HYDROMORPHONE HCL 1 MG/ML IJ SOLN: 0.2500 mg | INTRAMUSCULAR | Status: DC | PRN

## 2017-11-26 MED ORDER — PROMETHAZINE HCL 25 MG/ML IJ SOLN: 6.2500 mg | INTRAMUSCULAR | Status: DC | PRN

## 2017-11-26 MED ORDER — PROPOFOL 500 MG/50ML IV EMUL
INTRAVENOUS | Status: DC | PRN
  Administered 2017-11-26: 13:00:00 via INTRAVENOUS
  Administered 2017-11-26: 150 ug/kg/min via INTRAVENOUS

## 2017-11-26 MED ORDER — STERILE WATER FOR IRRIGATION IR SOLN
Status: DC | PRN
  Administered 2017-11-26: 100 mL

## 2017-11-26 MED ORDER — MIDAZOLAM HCL 2 MG/2ML IJ SOLN
INTRAMUSCULAR | Status: AC
  Filled 2017-11-26: qty 2

## 2017-11-26 MED ORDER — LACTATED RINGERS IV SOLN: INTRAVENOUS | Status: DC

## 2017-11-26 MED ORDER — PROPOFOL 10 MG/ML IV BOLUS
INTRAVENOUS | Status: AC
  Filled 2017-11-26: qty 40

## 2017-11-26 NOTE — Anesthesia Preprocedure Evaluation (Signed)
Anesthesia Evaluation  Patient identified by MRN, date of birth, ID band Patient awake    Reviewed: Allergy & Precautions, H&P , NPO status , Patient's Chart, lab work & pertinent test results, reviewed documented beta blocker date and time   Airway Mallampati: II  TM Distance: >3 FB Neck ROM: full    Dental no notable dental hx.    Pulmonary neg pulmonary ROS, shortness of breath and with exertion, Current Smoker,    Pulmonary exam normal breath sounds clear to auscultation       Cardiovascular Exercise Tolerance: Good hypertension, On Medications negative cardio ROS   Rhythm:regular Rate:Normal     Neuro/Psych  Headaches, PSYCHIATRIC DISORDERS Anxiety  Neuromuscular disease negative neurological ROS  negative psych ROS   GI/Hepatic negative GI ROS, Neg liver ROS, (+) C  Endo/Other  negative endocrine ROS  Renal/GU negative Renal ROS  negative genitourinary   Musculoskeletal   Abdominal   Peds  Hematology negative hematology ROS (+) anemia ,   Anesthesia Other Findings Polysubstance, IV drug abuse hx with Hep C States completed Hep C treatment Positive PPD hx CVA in late 1990s  Reproductive/Obstetrics negative OB ROS                             Anesthesia Physical Anesthesia Plan  ASA: III  Anesthesia Plan: MAC   Post-op Pain Management:    Induction:   PONV Risk Score and Plan:   Airway Management Planned:   Additional Equipment:   Intra-op Plan:   Post-operative Plan:   Informed Consent: I have reviewed the patients History and Physical, chart, labs and discussed the procedure including the risks, benefits and alternatives for the proposed anesthesia with the patient or authorized representative who has indicated his/her understanding and acceptance.   Dental Advisory Given  Plan Discussed with: CRNA and Anesthesiologist  Anesthesia Plan Comments:          Anesthesia Quick Evaluation

## 2017-11-26 NOTE — Discharge Instructions (Signed)
Colon Polyps Polyps are tissue growths inside the body. Polyps can grow in many places, including the large intestine (colon). A polyp may be a round bump or a mushroom-shaped growth. You could have one polyp or several. Most colon polyps are noncancerous (benign). However, some colon polyps can become cancerous over time. What are the causes? The exact cause of colon polyps is not known. What increases the risk? This condition is more likely to develop in people who:  Have a family history of colon cancer or colon polyps.  Are older than 73 or older than 45 if they are African American.  Have inflammatory bowel disease, such as ulcerative colitis or Crohn disease.  Are overweight.  Smoke cigarettes.  Do not get enough exercise.  Drink too much alcohol.  Eat a diet that is: ? High in fat and red meat. ? Low in fiber.  Had childhood cancer that was treated with abdominal radiation.  What are the signs or symptoms? Most polyps do not cause symptoms. If you have symptoms, they may include:  Blood coming from your rectum when having a bowel movement.  Blood in your stool.The stool may look dark red or black.  A change in bowel habits, such as constipation or diarrhea.  How is this diagnosed? This condition is diagnosed with a colonoscopy. This is a procedure that uses a lighted, flexible scope to look at the inside of your colon. How is this treated? Treatment for this condition involves removing any polyps that are found. Those polyps will then be tested for cancer. If cancer is found, your health care provider will talk to you about options for colon cancer treatment. Follow these instructions at home: Diet  Eat plenty of fiber, such as fruits, vegetables, and whole grains.  Eat foods that are high in calcium and vitamin D, such as milk, cheese, yogurt, eggs, liver, fish, and broccoli.  Limit foods high in fat, red meats, and processed meats, such as hot dogs, sausage,  bacon, and lunch meats.  Maintain a healthy weight, or lose weight if recommended by your health care provider. General instructions  Do not smoke cigarettes.  Do not drink alcohol excessively.  Keep all follow-up visits as told by your health care provider. This is important. This includes keeping regularly scheduled colonoscopies. Talk to your health care provider about when you need a colonoscopy.  Exercise every day or as told by your health care provider. Contact a health care provider if:  You have new or worsening bleeding during a bowel movement.  You have new or increased blood in your stool.  You have a change in bowel habits.  You unexpectedly lose weight. This information is not intended to replace advice given to you by your health care provider. Make sure you discuss any questions you have with your health care provider. Document Released: 11/10/2003 Document Revised: 07/22/2015 Document Reviewed: 01/04/2015 Elsevier Interactive Patient Education  2018 [employer]. Hemorrhoids Hemorrhoids are swollen veins in and around the rectum or anus. There are two types of hemorrhoids:  Internal hemorrhoids. These occur in the veins that are just inside the rectum. They may poke through to the outside and become irritated and painful.  External hemorrhoids. These occur in the veins that are outside of the anus and can be felt as a painful swelling or hard lump near the anus.  Most hemorrhoids do not cause serious problems, and they can be managed with home treatments such as diet and lifestyle changes. If  home treatments do not help your symptoms, procedures can be done to shrink or remove the hemorrhoids. What are the causes? This condition is caused by increased pressure in the anal area. This pressure may result from various things, including:  Constipation.  Straining to have a bowel movement.  Diarrhea.  Pregnancy.  Obesity.  Sitting for long periods of  time.  Heavy lifting or other activity that causes you to strain.  Anal sex.  What are the signs or symptoms? Symptoms of this condition include:  Pain.  Anal itching or irritation.  Rectal bleeding.  Leakage of stool (feces).  Anal swelling.  One or more lumps around the anus.  How is this diagnosed? This condition can often be diagnosed through a visual exam. Other exams or tests may also be done, such as:  Examination of the rectal area with a gloved hand (digital rectal exam).  Examination of the anal canal using a small tube (anoscope).  A blood test, if you have lost a significant amount of blood.  A test to look inside the colon (sigmoidoscopy or colonoscopy).  How is this treated? This condition can usually be treated at home. However, various procedures may be done if dietary changes, lifestyle changes, and other home treatments do not help your symptoms. These procedures can help make the hemorrhoids smaller or remove them completely. Some of these procedures involve surgery, and others do not. Common procedures include:  Rubber band ligation. Rubber bands are placed at the base of the hemorrhoids to cut off the blood supply to them.  Sclerotherapy. Medicine is injected into the hemorrhoids to shrink them.  Infrared coagulation. A type of light energy is used to get rid of the hemorrhoids.  Hemorrhoidectomy surgery. The hemorrhoids are surgically removed, and the veins that supply them are tied off.  Stapled hemorrhoidopexy surgery. A circular stapling device is used to remove the hemorrhoids and use staples to cut off the blood supply to them.  Follow these instructions at home: Eating and drinking  Eat foods that have a lot of fiber in them, such as whole grains, beans, nuts, fruits, and vegetables. Ask your health care provider about taking products that have added fiber (fiber supplements).  Drink enough fluid to keep your urine clear or pale  yellow. Managing pain and swelling  Take warm sitz baths for 20 minutes, 3-4 times a day to ease pain and discomfort.  If directed, apply ice to the affected area. Using ice packs between sitz baths may be helpful. ? Put ice in a plastic bag. ? Place a towel between your skin and the bag. ? Leave the ice on for 20 minutes, 2-3 times a day. General instructions  Take over-the-counter and prescription medicines only as told by your health care provider.  Use medicated creams or suppositories as told.  Exercise regularly.  Go to the bathroom when you have the urge to have a bowel movement. Do not wait.  Avoid straining to have bowel movements.  Keep the anal area dry and clean. Use wet toilet paper or moist towelettes after a bowel movement.  Do not sit on the toilet for long periods of time. This increases blood pooling and pain. Contact a health care provider if:  You have increasing pain and swelling that are not controlled by treatment or medicine.  You have uncontrolled bleeding.  You have difficulty having a bowel movement, or you are unable to have a bowel movement.  You have pain or inflammation outside  the area of the hemorrhoids. This information is not intended to replace advice given to you by your health care provider. Make sure you discuss any questions you have with your health care provider. Document Released: 02/11/2000 Document Revised: 07/14/2015 Document Reviewed: 10/28/2014 Elsevier Interactive Patient Education  2018 [employer]. EGD Discharge instructions Please read the instructions outlined below and refer to this sheet in the next few weeks. These discharge instructions provide you with general information on caring for yourself after you leave the hospital. Your doctor may also give you specific instructions. While your treatment has been planned according to the most current medical practices available, unavoidable complications occasionally occur. If  you have any problems or questions after discharge, please call your doctor. ACTIVITY  You may resume your regular activity but move at a slower pace for the next 24 hours.   Take frequent rest periods for the next 24 hours.   Walking will help expel (get rid of) the air and reduce the bloated feeling in your abdomen.   No driving for 24 hours (because of the anesthesia (medicine) used during the test).   You may shower.   Do not sign any important legal documents or operate any machinery for 24 hours (because of the anesthesia used during the test).  NUTRITION  Drink plenty of fluids.   You may resume your normal diet.   Begin with a light meal and progress to your normal diet.   Avoid alcoholic beverages for 24 hours or as instructed by your caregiver.  MEDICATIONS  You may resume your normal medications unless your caregiver tells you otherwise.  WHAT YOU CAN EXPECT TODAY  You may experience abdominal discomfort such as a feeling of fullness or gas pains.  FOLLOW-UP  Your doctor will discuss the results of your test with you.  SEEK IMMEDIATE MEDICAL ATTENTION IF ANY OF THE FOLLOWING OCCUR:  Excessive nausea (feeling sick to your stomach) and/or vomiting.   Severe abdominal pain and distention (swelling).   Trouble swallowing.   Temperature over 101 F (37.8 C).   Rectal bleeding or vomiting of blood.    Hemorrhoid, diverticulosis and colon polyp information provided  Linzess 290 once daily  MiraLAX 17 g orally twice daily as needed for constipation  Pamphlet on hemorrhoid banding.  Office visit with me in 6 to 8 weeks.  Further recommendations to follow pending review of pathology report

## 2017-11-26 NOTE — Anesthesia Postprocedure Evaluation (Signed)
Anesthesia Post Note  Patient: [name]  Procedure(s) Performed: COLONOSCOPY WITH PROPOFOL (N/A ) POLYPECTOMY  Patient location during evaluation: PACU Anesthesia Type: MAC Level of consciousness: awake and patient cooperative Pain management: pain level controlled Vital Signs Assessment: post-procedure vital signs reviewed and stable Respiratory status: spontaneous breathing, nonlabored ventilation and respiratory function stable Cardiovascular status: blood pressure returned to baseline Postop Assessment: no apparent nausea or vomiting Anesthetic complications: no     Last Vitals:  Vitals:   11/26/17 1105 11/26/17 1109  BP:  (!) 147/90  Pulse: 82   Resp: 12   Temp: 36.8 C   SpO2: 98%     Last Pain:  Vitals:   11/26/17 1249  TempSrc:   PainSc: 0-No pain                 Diamantina Edinger J

## 2017-11-26 NOTE — Op Note (Signed)
[facility] Patient Name: [name] Procedure Date: 11/26/2017 12:03 PM MRN: [id number] Date of Birth: [birth year] Attending MD: Norvel Richards , MD CSN: [id number] Age: 57 Admit Type: Outpatient Procedure:                Colonoscopy Indications:              Hematochezia Providers:                Norvel Richards, MD, Rosina Lowenstein, RN, Nelma Rothman, Technician Referring MD:             Jasper Loser. Luan Pulling MD, MD Medicines:                Propofol per Anesthesia Complications:            No immediate complications. Estimated Blood Loss:     Estimated blood loss was minimal. Procedure:                Pre-Anesthesia Assessment:                           - Prior to the procedure, a History and Physical                            was performed, and patient medications and                            allergies were reviewed. The patient's tolerance of                            previous anesthesia was also reviewed. The risks                            and benefits of the procedure and the sedation                            options and risks were discussed with the patient.                            All questions were answered, and informed consent                            was obtained. Prior Anticoagulants: The patient has                            taken no previous anticoagulant or antiplatelet                            agents. ASA Grade Assessment: II - A patient with                            mild systemic disease. After reviewing the risks  and benefits, the patient was deemed in                            satisfactory condition to undergo the procedure.                           After obtaining informed consent, the colonoscope                            was passed under direct vision. Throughout the                            procedure, the patient's blood pressure, pulse, and                            oxygen  saturations were monitored continuously. The                            [id number]-[id number] ([id number]) scope was introduced through                            the and advanced to the the cecum, identified by                            appendiceal orifice and ileocecal valve. The                            colonoscopy was performed without difficulty. The                            patient tolerated the procedure well. The quality                            of the bowel preparation was adequate. The                            ileocecal valve, appendiceal orifice, and rectum                            were photographed. The colonoscopy was performed                            without difficulty. Scope In: 12:26:15 PM Scope Out: 89:16:94 PM Scope Withdrawal Time: 0 hours 17 minutes 23 seconds  Total Procedure Duration: 0 hours 23 minutes 24 seconds  Findings:      Hemorrhoids were found on perianal exam.      Non-bleeding external and internal hemorrhoids were found during       retroflexion. The hemorrhoids were moderate, medium-sized and Grade III       (internal hemorrhoids that prolapse but require manual reduction). Scar       present distal rectum - site of previous banding.      Scattered medium-mouthed diverticula were found in the sigmoid colon.      A 5 mm polyp was found in the splenic flexure. The polyp  was sessile.       The polyp was removed with a cold snare. Resection and retrieval were       complete. Estimated blood loss was minimal.      A 12 mm polyp was found in the hepatic flexure. The polyp was sessile.       The polyp was removed with a hot snare. Resection and retrieval were       complete. Estimated blood loss: none.      The exam was otherwise without abnormality on direct and retroflexion       views. Impression:               - Hemorrhoids found on perianal exam.                           - Non-bleeding external and internal hemorrhoids.                           -  Diverticulosis in the sigmoid colon.                           - One 5 mm polyp at the splenic flexure, removed                            with a cold snare. Resected and retrieved.                           - One 12 mm polyp at the hepatic flexure, removed                            with a hot snare. Resected and retrieved.                           - The examination was otherwise normal on direct                            and retroflexion views. Moderate Sedation:      Moderate (conscious) sedation was personally administered by an       anesthesia professional. The following parameters were monitored: oxygen       saturation, heart rate, blood pressure, respiratory rate, EKG, adequacy       of pulmonary ventilation, and response to care. Total physician       intraservice time was 29 minutes. Recommendation:           - Patient has a contact number available for                            emergencies. The signs and symptoms of potential                            delayed complications were discussed with the                            patient. Return to normal activities tomorrow.  Written discharge instructions were provided to the                            patient.                           - Resume previous diet.                           - Continue present medications. Continue Linzess                            291 daily. MiraLAX 17 g orally twice daily. Once                            constipation controlled, if persisting symptoms,                            could consider repeat hemorrhoid banding.                           - Repeat colonoscopy for surveillance based on                            pathology results.                           - Return to GI office in 6 weeks. Procedure Code(s):        --- Professional ---                           [phone], Colonoscopy, flexible; with removal of                            tumor(s), polyp(s), or other  lesion(s) by snare                            technique Diagnosis Code(s):        --- Professional ---                           K64.2, Third degree hemorrhoids                           D12.3, Benign neoplasm of transverse colon (hepatic                            flexure or splenic flexure)                           K92.1, Melena (includes Hematochezia)                           K57.30, Diverticulosis of large intestine without                            perforation or abscess without bleeding  CPT copyright 2017 [employer]. All rights reserved. The codes documented in this report are preliminary and upon coder review may  be revised to meet current compliance requirements. Cristopher Estimable. Hallie Ishida, MD Norvel Richards, MD 11/26/2017 12:59:42 PM This report has been signed electronically. Number of Addenda: 0

## 2017-11-26 NOTE — Telephone Encounter (Signed)
[facility] is sending patient over from Short Stay to get samples on LInzess 290 mg

## 2017-11-26 NOTE — Telephone Encounter (Signed)
PT given 3 boxes ( #12 tablets) of the Linzess 290 mcg.  Take one capsule daily 3o min prior to breakfast.
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
## 2017-11-26 NOTE — Transfer of Care (Signed)
Immediate Anesthesia Transfer of Care Note  Patient: [name]  Procedure(s) Performed: COLONOSCOPY WITH PROPOFOL (N/A ) POLYPECTOMY  Patient Location: PACU  Anesthesia Type:MAC  Level of Consciousness: awake and patient cooperative  Airway & Oxygen Therapy: Patient Spontanous Breathing  Post-op Assessment: Report given to RN, Post -op Vital signs reviewed and stable and Patient moving all extremities  Post vital signs: Reviewed and stable  Last Vitals:  Vitals Value Taken Time  BP    Temp    Pulse    Resp    SpO2      Last Pain:  Vitals:   11/26/17 1249  TempSrc:   PainSc: 0-No pain         Complications: No apparent anesthesia complications

## 2017-11-26 NOTE — Telephone Encounter (Signed)
Thanks

## 2017-11-29 ENCOUNTER — Telehealth: Payer: Self-pay | Admitting: *Deleted

## 2017-11-29 ENCOUNTER — Telehealth: Payer: Self-pay | Admitting: Cardiology

## 2017-11-29 ENCOUNTER — Encounter (HOSPITAL_COMMUNITY): Payer: Self-pay | Admitting: Internal Medicine

## 2017-11-29 DIAGNOSIS — R079 Chest pain, unspecified: Secondary | ICD-10-CM

## 2017-11-29 NOTE — Telephone Encounter (Signed)
-----   Message from Chanda Busing sent at 11/27/2017  5:00 PM EDT ----- I have scheduled her [place] for 12-04-2017 she needs to arrive at the hospital 915 am for registeration.  She also NO the Echo. Will need new order put in for that to be done at [facility].   ----- Message ----- From: Massie Maroon, CMA Sent: 11/27/2017   9:15 AM EDT To: Lynnda Child Slaughter  Pt would like to reschedule stress test and not on a Wednesday - thank you  [facility]

## 2017-11-29 NOTE — Telephone Encounter (Signed)
Pre-cert Verification for the following procedure   Lexiscan and Echo scheduled for 12-12-2017 at [facility]

## 2017-11-29 NOTE — Telephone Encounter (Signed)
Pt says she would need to reschedule for the next week (transprotation issues) also wants to schedule both echo and stress test on the same day - orders in for echo and stress test - routed to schedulers

## 2017-11-30 ENCOUNTER — Encounter: Payer: Self-pay | Admitting: Internal Medicine

## 2017-12-03 ENCOUNTER — Other Ambulatory Visit: Payer: Self-pay | Admitting: Obstetrics & Gynecology

## 2017-12-04 ENCOUNTER — Encounter (HOSPITAL_COMMUNITY): Payer: Self-pay

## 2017-12-04 ENCOUNTER — Ambulatory Visit (HOSPITAL_COMMUNITY): Payer: Self-pay

## 2017-12-06 ENCOUNTER — Telehealth: Payer: Self-pay | Admitting: Internal Medicine

## 2017-12-06 NOTE — Telephone Encounter (Signed)
Spoke with pt, she is aware that her results were mailed yesterday. Discuss results over the phone, samples are ready for pickup.

## 2017-12-06 NOTE — Telephone Encounter (Signed)
Patient called and wanted some linzess samples, wants family member to pick them up today, also inquiring about biopsy results

## 2017-12-11 ENCOUNTER — Telehealth: Payer: Self-pay | Admitting: Cardiology

## 2017-12-11 NOTE — Telephone Encounter (Signed)
Patient scheduled for test tomorrow at [facility] and wants to know if she should cancel or not.   Stated that today she has felt fine but past two days she has not felt right and has woken up out of sleep soaking wet from sweating.

## 2017-12-11 NOTE — Telephone Encounter (Signed)
Did have fever yesterday, did not have way to check but was burning up.  Felt warm again today.  Stated that her pmd give her some Tussionex for the cough & really bad sore throat.  Also, given Ultram for back pain.    Nurse suggested that she reschedule till after she is well & no fever.  Will notify [facility] to reschedule to the latter part of next week.  She verbalized understanding.

## 2017-12-12 ENCOUNTER — Telehealth: Payer: Self-pay | Admitting: Internal Medicine

## 2017-12-12 ENCOUNTER — Other Ambulatory Visit (HOSPITAL_COMMUNITY): Payer: Self-pay

## 2017-12-12 ENCOUNTER — Ambulatory Visit (HOSPITAL_COMMUNITY): Payer: Self-pay

## 2017-12-12 ENCOUNTER — Encounter (HOSPITAL_COMMUNITY): Payer: Self-pay

## 2017-12-12 NOTE — Telephone Encounter (Signed)
Spoke with pt. She took some miralax since calling this morning. Pt is going to see if that helps her, increase her fluid intake and call back on Friday if she hasn't had a bowel movement.

## 2017-12-12 NOTE — Telephone Encounter (Signed)
Pt said she hasn't had a BM in 3 days and she was taking Miralax. Please call her at [phone]

## 2017-12-19 ENCOUNTER — Other Ambulatory Visit (HOSPITAL_COMMUNITY): Payer: Self-pay

## 2017-12-19 ENCOUNTER — Encounter (HOSPITAL_COMMUNITY): Payer: Self-pay

## 2017-12-25 ENCOUNTER — Telehealth: Payer: Self-pay | Admitting: Orthopedic Surgery

## 2017-12-25 NOTE — Telephone Encounter (Signed)
Patient is asking for Dr. Aline Brochure to increase the milligrams of her Gabapentin.  Take 1 capsule (100mg  total) by mouth every 3 (three) times daily.   Qty 90 Capsules  PATIENT USES [facility]

## 2017-12-25 NOTE — Telephone Encounter (Signed)
Patient called to let Dr. Aline Brochure know the following: She still has no Medicaid and she went to court about her disability with her attorney in [employer] and they were in front of [place]. Stated she went to the ER and is getting over pneumonia in the right lung and waiting on a inhaler. She is scheduled to see a psychiatrist on 11/18 about her Medicaid. States nobody will give her the injections because she doesn't have Medicaid. She states she needs Dr. Aline Brochure to fill out a form stating her disability so that she can move into a low income apartment. She is having the company fax the forms over for him and she needs them faxed back to them by Thursday morning.

## 2017-12-25 NOTE — Telephone Encounter (Signed)
Dr Aline Brochure does not do these types of forms She needs to discuss with primary care  I have called her to advise.  To you FYI

## 2017-12-26 ENCOUNTER — Other Ambulatory Visit: Payer: Self-pay | Admitting: Orthopedic Surgery

## 2017-12-26 MED ORDER — GABAPENTIN 300 MG PO CAPS: 300.0000 mg | ORAL_CAPSULE | Freq: Three times a day (TID) | ORAL | 5 refills | Status: DC

## 2017-12-27 ENCOUNTER — Ambulatory Visit (INDEPENDENT_AMBULATORY_CARE_PROVIDER_SITE_OTHER): Payer: Self-pay | Admitting: Gastroenterology

## 2017-12-27 ENCOUNTER — Encounter: Payer: Self-pay | Admitting: Internal Medicine

## 2017-12-27 ENCOUNTER — Encounter: Payer: Self-pay | Admitting: Gastroenterology

## 2017-12-27 ENCOUNTER — Telehealth: Payer: Self-pay | Admitting: Gastroenterology

## 2017-12-27 VITALS — BP 149/100 | HR 116 | Temp 99.2°F | Ht 62.0 in | Wt 160.0 lb

## 2017-12-27 DIAGNOSIS — K642 Third degree hemorrhoids: Secondary | ICD-10-CM

## 2017-12-27 DIAGNOSIS — K641 Second degree hemorrhoids: Secondary | ICD-10-CM

## 2017-12-27 NOTE — Telephone Encounter (Signed)
Patient request hyoscyamine, which would not be appropriate in setting of constipation.   Concerned about blood work: it is too early to recheck now. Keep plans for Dec 2019 to recheck Hep C RNA and HFP.   Keep on Linzess 290 mcg. We can provide samples and/or do patient assistance. She should have insurance in the near future to cover this, but we can provide samples in the meantime.

## 2017-12-27 NOTE — Telephone Encounter (Signed)
Noted. Pt notified.  

## 2017-12-27 NOTE — Progress Notes (Addendum)
Meredith Banding Procedure Note:   [name] has presented today in follow-up after colonoscopy in Sept 2019. She has had a BM once per week historically, but Linzess 290 mcg with much improvement, going every 2 days. Using fiber. Notes itching, bleeding, pressure, mild discomfort at times.   The patient presents with symptomatic grade 2-3  hemorrhoids, unresponsive to maximal medical therapy, requesting rubber band ligation of her hemorrhoidal disease. All risks, benefits, and alternative forms of therapy were described and informed consent was obtained.  In the left lateral decubitus position  anoscopic examination revealed grade 2-3 hemorrhoids.  The decision was made to band the right posterior internal hemorrhoid, and the [place] was used to perform band ligation without complication. Digital anorectal examination was then performed to assure proper positioning of the band, and to adjust the banded tissue as required. The patient was discharged home without pain or other issues. Dietary and behavioral recommendations were given. Linzess 290 mcg once daily was provide, along with follow-up instructions. The patient will return in 2-3 weeks for followup and possible additional banding as required.  No complications were encountered and the patient tolerated the procedure well.  [name], PhD, ANP-BC [facility]

## 2017-12-27 NOTE — Patient Instructions (Signed)
Avoid straining.  Benefiber 2 teaspoons twice daily  Limit toilet time to 2-3 minutes  Call with any interim problems  Continue to take Linzess 290 mcg once daily.   Schedule followup appointment in 2-3 weeks from now   I enjoyed seeing you again today! As you know, I value our relationship and want to provide genuine, compassionate, and quality care. I welcome your feedback. If you receive a survey regarding your visit,  I greatly appreciate you taking time to fill this out. See you next time!  [name], PhD, ANP-BC [facility]

## 2017-12-28 ENCOUNTER — Encounter (HOSPITAL_COMMUNITY): Payer: Self-pay

## 2017-12-28 ENCOUNTER — Other Ambulatory Visit (HOSPITAL_COMMUNITY): Payer: Self-pay

## 2018-01-01 ENCOUNTER — Telehealth: Payer: Self-pay | Admitting: Cardiology

## 2018-01-01 NOTE — Telephone Encounter (Signed)
Patient has Lexiscan & Echo scheduled for tomorrow.  Stated that she lost her instructions.  Reviewed instructions with patient.  Nothing to eat/drink 6 hours prior to test, no caffeine or tobacco products as well.  She may take all of her medications the morning of with sips of water.  Plan to be there 3-4 hours or longer since having echo also.  Patient appreciative of the return call and verbalized understanding.

## 2018-01-01 NOTE — Telephone Encounter (Signed)
Patient called stating that she has questions about her upcoming testing scheduled for tomorrow at [facility]. She requested to be called on home phone.

## 2018-01-02 ENCOUNTER — Telehealth: Payer: Self-pay | Admitting: *Deleted

## 2018-01-02 ENCOUNTER — Ambulatory Visit (HOSPITAL_COMMUNITY)
Admission: RE | Admit: 2018-01-02 | Discharge: 2018-01-02 | Disposition: A | Discharge status: Home / Self Care | Payer: Self-pay | Source: Ambulatory Visit | Attending: Cardiology | Admitting: Cardiology

## 2018-01-02 ENCOUNTER — Encounter (HOSPITAL_BASED_OUTPATIENT_CLINIC_OR_DEPARTMENT_OTHER)
Admission: RE | Admit: 2018-01-02 | Discharge: 2018-01-02 | Disposition: A | Discharge status: Home / Self Care | Payer: Self-pay | Source: Ambulatory Visit | Attending: Cardiology | Admitting: Cardiology

## 2018-01-02 ENCOUNTER — Encounter (HOSPITAL_COMMUNITY)
Admission: RE | Admit: 2018-01-02 | Discharge: 2018-01-02 | Disposition: A | Discharge status: Home / Self Care | Payer: Self-pay | Source: Ambulatory Visit | Attending: Cardiology | Admitting: Cardiology

## 2018-01-02 ENCOUNTER — Encounter (HOSPITAL_COMMUNITY): Payer: Self-pay

## 2018-01-02 DIAGNOSIS — R9431 Abnormal electrocardiogram [ECG] [EKG]: Secondary | ICD-10-CM | POA: Insufficient documentation

## 2018-01-02 DIAGNOSIS — R011 Cardiac murmur, unspecified: Secondary | ICD-10-CM

## 2018-01-02 DIAGNOSIS — R079 Chest pain, unspecified: Secondary | ICD-10-CM | POA: Insufficient documentation

## 2018-01-02 DIAGNOSIS — R0602 Shortness of breath: Secondary | ICD-10-CM

## 2018-01-02 DIAGNOSIS — I350 Nonrheumatic aortic (valve) stenosis: Secondary | ICD-10-CM

## 2018-01-02 HISTORY — DX: Systemic involvement of connective tissue, unspecified: M35.9

## 2018-01-02 HISTORY — DX: Unspecified asthma, uncomplicated: J45.909

## 2018-01-02 LAB — NM MYOCAR MULTI W/SPECT W/WALL MOTION / EF
CHL CUP RESTING HR STRESS: 87 {beats}/min
LHR: 0.37
LVDIAVOL: 81 mL (ref 46–106)
LVSYSVOL: 31 mL
NUC STRESS TID: 1.26
Peak HR: 96 {beats}/min
SDS: 2
SRS: 2
SSS: 4

## 2018-01-02 MED ORDER — SODIUM CHLORIDE 0.9% FLUSH
INTRAVENOUS | Status: AC
  Administered 2018-01-02: 10 mL via INTRAVENOUS
  Filled 2018-01-02: qty 10

## 2018-01-02 MED ORDER — REGADENOSON 0.4 MG/5ML IV SOLN
INTRAVENOUS | Status: AC
  Administered 2018-01-02: 0.4 mg via INTRAVENOUS
  Filled 2018-01-02: qty 5

## 2018-01-02 MED ORDER — TECHNETIUM TC 99M TETROFOSMIN IV KIT
10.0000 | PACK | Freq: Once | INTRAVENOUS | Status: AC | PRN
  Administered 2018-01-02: 11 via INTRAVENOUS

## 2018-01-02 MED ORDER — TECHNETIUM TC 99M TETROFOSMIN IV KIT
30.0000 | PACK | Freq: Once | INTRAVENOUS | Status: AC | PRN
  Administered 2018-01-02: 33 via INTRAVENOUS

## 2018-01-02 NOTE — Telephone Encounter (Signed)
-----   Message from Satira Sark, MD sent at 01/02/2018  3:54 PM EST ----- Results reviewed.  Overall low risk results with suspected soft tissue attenuation rather than ischemia and normal LVEF.  Would suggest risk factor modification and medical therapy, keep follow-up with PCP. A copy of this test should be forwarded to Sinda Du, MD.

## 2018-01-02 NOTE — Progress Notes (Signed)
*  PRELIMINARY RESULTS* Echocardiogram 2D Echocardiogram has been performed.  [name] 01/02/2018, 1:32 PM

## 2018-01-02 NOTE — Telephone Encounter (Signed)
-----   Message from Satira Sark, MD sent at 01/02/2018  3:57 PM EST ----- Results reviewed.  LVEF normal.  Heart murmur corresponds with mild aortic stenosis, although would not expect active symptoms in association with this currently.  She should continue to follow with PCP.  Would anticipate a follow-up echocardiogram within the next 2 years. A copy of this test should be forwarded to Sinda Du, MD.

## 2018-01-02 NOTE — Telephone Encounter (Signed)
Patient informed and verbalized understanding of plan. Copy sent to PCP 

## 2018-01-09 ENCOUNTER — Other Ambulatory Visit: Payer: Self-pay

## 2018-01-09 DIAGNOSIS — R011 Cardiac murmur, unspecified: Secondary | ICD-10-CM

## 2018-01-09 DIAGNOSIS — I350 Nonrheumatic aortic (valve) stenosis: Secondary | ICD-10-CM

## 2018-01-09 DIAGNOSIS — B182 Chronic viral hepatitis C: Secondary | ICD-10-CM

## 2018-01-14 ENCOUNTER — Other Ambulatory Visit (HOSPITAL_COMMUNITY): Payer: Self-pay

## 2018-01-14 ENCOUNTER — Encounter (HOSPITAL_COMMUNITY): Payer: Self-pay

## 2018-01-16 ENCOUNTER — Encounter: Payer: Self-pay | Admitting: Gastroenterology

## 2018-01-16 ENCOUNTER — Ambulatory Visit (INDEPENDENT_AMBULATORY_CARE_PROVIDER_SITE_OTHER): Payer: Self-pay | Admitting: Gastroenterology

## 2018-01-16 VITALS — BP 135/85 | HR 100 | Temp 98.4°F | Ht 62.0 in | Wt 156.8 lb

## 2018-01-16 DIAGNOSIS — K641 Second degree hemorrhoids: Secondary | ICD-10-CM

## 2018-01-16 NOTE — Progress Notes (Signed)
[facility] Banding Note:     The patient presents in follow-up after banding of right posterior hemorrhoid. Some improvement noted, decrease in toilet time. Some itching and bleeding but improved.  Symptomatic grade 2-3 hemorrhoids, unresponsive to maximal medical therapy, requesting rubber band ligation of her hemorrhoidal disease. All risks, benefits, and alternative forms of therapy were described and informed consent was obtained.  The decision was made to band the right anterior internal hemorrhoid, and the [place] was used to perform band ligation without complication. Digital anorectal examination was then performed to assure proper positioning of the band, and to adjust the banded tissue as required. The patient was discharged home without pain or other issues. Dietary and behavioral recommendations were given, along with follow-up instructions. The patient will return in 2-3 weeks for followup and possible additional banding as required.  No complications were encountered and the patient tolerated the procedure well. She will continue Linzess 290 mcg samples, and we will attempt to obtain patient assistance for her.  [name], PhD, ANP-BC [facility]

## 2018-01-16 NOTE — Patient Instructions (Signed)
Avoid straining.  Benefiber 2 teaspoons twice daily  Limit toilet time to 2-3 minutes  Call with any interim problems  Please schedule followup appointment in 2-3 weeks from now.  Continue Linzess 290 micrograms once each morning, 30 minutes before breakfast. We are providing samples, and we will also provide patient assistance forms.   I enjoyed seeing you again today! As you know, I value our relationship and want to provide genuine, compassionate, and quality care. I welcome your feedback. If you receive a survey regarding your visit,  I greatly appreciate you taking time to fill this out. See you next time!  [name], PhD, ANP-BC [facility]

## 2018-02-07 ENCOUNTER — Ambulatory Visit: Payer: Self-pay | Admitting: Gastroenterology

## 2018-02-07 ENCOUNTER — Telehealth: Payer: Self-pay | Admitting: Internal Medicine

## 2018-02-07 NOTE — Telephone Encounter (Signed)
Patient will be by Tuesday AM after she has her labs drawn and she needs linzess samples

## 2018-02-07 NOTE — Telephone Encounter (Signed)
Noted. Samples are ready for pickup. Linzess 290 mcg.

## 2018-02-12 ENCOUNTER — Emergency Department (HOSPITAL_COMMUNITY)
Admission: EM | Admit: 2018-02-12 | Discharge: 2018-02-12 | Disposition: A | Discharge status: Home / Self Care | Payer: Self-pay | Attending: Emergency Medicine | Admitting: Emergency Medicine

## 2018-02-12 ENCOUNTER — Other Ambulatory Visit: Payer: Self-pay

## 2018-02-12 ENCOUNTER — Encounter (HOSPITAL_COMMUNITY): Payer: Self-pay | Admitting: Emergency Medicine

## 2018-02-12 DIAGNOSIS — M5442 Lumbago with sciatica, left side: Secondary | ICD-10-CM | POA: Insufficient documentation

## 2018-02-12 DIAGNOSIS — J45909 Unspecified asthma, uncomplicated: Secondary | ICD-10-CM | POA: Insufficient documentation

## 2018-02-12 DIAGNOSIS — Z79899 Other long term (current) drug therapy: Secondary | ICD-10-CM | POA: Insufficient documentation

## 2018-02-12 DIAGNOSIS — Z7982 Long term (current) use of aspirin: Secondary | ICD-10-CM | POA: Insufficient documentation

## 2018-02-12 DIAGNOSIS — I10 Essential (primary) hypertension: Secondary | ICD-10-CM | POA: Insufficient documentation

## 2018-02-12 DIAGNOSIS — F17210 Nicotine dependence, cigarettes, uncomplicated: Secondary | ICD-10-CM | POA: Insufficient documentation

## 2018-02-12 MED ORDER — HYDROCODONE-ACETAMINOPHEN 5-325 MG PO TABS
1.0000 | ORAL_TABLET | Freq: Once | ORAL | Status: AC
  Administered 2018-02-12: 1 via ORAL
  Filled 2018-02-12: qty 1

## 2018-02-12 MED ORDER — PREDNISONE 20 MG PO TABS: 40.0000 mg | ORAL_TABLET | Freq: Every day | ORAL | 0 refills | Status: AC

## 2018-02-12 MED ORDER — DEXAMETHASONE SODIUM PHOSPHATE 10 MG/ML IJ SOLN
10.0000 mg | Freq: Once | INTRAMUSCULAR | Status: AC
  Administered 2018-02-12: 10 mg via INTRAMUSCULAR
  Filled 2018-02-12: qty 1

## 2018-02-12 NOTE — ED Triage Notes (Signed)
Pt c/o of lower back pain over a year.  Denies any new injuries. Stated she already had an MRI in May and states " I cant keep coming up here with this pain"

## 2018-02-12 NOTE — ED Notes (Signed)
Pt at nurses desk requesting to go over to radiolodgy area to speak to someone. Informed patient that she is being discharged and that as soon as we have her papers we would bring them to her and show her where to go. " I have been waiting all day since 0930am and it looks like yall are "really busy." informed pt that for her safety that while she is an ER Pt we are responsible for her. Pt states I am going over there now. Pt walked out of ED towards Radiology doors.
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
## 2018-02-12 NOTE — Discharge Instructions (Signed)
Please see the information and instructions below regarding your visit.  Your diagnoses today include:  1. Acute left-sided low back pain with left-sided sciatica    About diagnosis. Most episodes of acute low back pain are self-limited. Your exam was reassuring today that the source of your pain is not affecting the spinal cord and nerves that originate in the spinal cord.   If you have a history of disc herniation or arthritis in your spine, the nerves exiting the spine on one side get inflamed. This can cause severe pain. We call this radiculopathy. We do not always know what causes the sudden inflammation.  Tests performed today include: See side panel of your discharge paperwork for testing performed today. Vital signs are listed at the bottom of these instructions.   Medications prescribed:    Take any prescribed medications only as prescribed, and any over the counter medications only as directed on the packaging.  You are prescribed prednisone, a steroid. This is a medication to help reduce inflammation in the nerves of the spine.  Common side effects include upset stomach/nausea. You may take this medicine with food if this occurs. Other side effects include restlessness, difficulty sleeping, and increased sweating. Call your healthcare provider if these do not resolve after finishing the medication.  This medicine may increase your blood sugar so additional careful monitoring is needed of blood sugar if you have diabetes. Call your healthcare provider for any signs/symtpoms of high blood sugar such as confusion, feeling sleepy, more thirst, more hunger, passing urine more often, flushing, fast breathing, or breath that smells like fruit.   Home care instructions:   Low back pain gets worse the longer you stay stationary. Please keep moving and walking as tolerated. There are exercises included in this packet to perform as tolerated for your low back pain.   Apply heat to the  areas that are painful. Avoid twisting or bending your trunk to lift something. Do not lift anything above 25 lbs while recovering from this flare of low back pain.  Please follow any educational materials contained in this packet.   Follow-up instructions: Please follow-up with your primary care provider as soon as possible for further evaluation of your symptoms if they are not completely improved.   Return instructions:  Please return to the Emergency Department if you experience worsening symptoms.  Please return for any fever or chills in the setting of your back pain, weakness in the muscles of the legs, numbness in your legs and feet that is new or changing, numbness in the area where you wipe, retention of your urine, loss of bowel or bladder control, or problems with walking. Please return if you have any other emergent concerns.  Additional Information:   Your vital signs today were: BP 131/67    Pulse 87    Temp 97.8 F (36.6 C) (Oral)    Resp 18    Ht 5\' 2"  (1.575 m)    Wt 70.8 kg    SpO2 98%    BMI 28.53 kg/m  If your blood pressure (BP) was elevated on multiple readings during this visit above 130 for the top number or above 80 for the bottom number, please have this repeated by your primary care provider within one month. --------------  Thank you for allowing [place] to participate in your care today.

## 2018-02-14 ENCOUNTER — Telehealth: Payer: Self-pay

## 2018-02-14 LAB — HEPATIC FUNCTION PANEL
AG Ratio: 1.9 (calc) (ref 1.0–2.5)
ALBUMIN MSPROF: 4.6 g/dL (ref 3.6–5.1)
ALKALINE PHOSPHATASE (APISO): 66 U/L (ref 33–130)
ALT: 17 U/L (ref 6–29)
AST: 17 U/L (ref 10–35)
BILIRUBIN DIRECT: 0.1 mg/dL (ref 0.0–0.2)
BILIRUBIN INDIRECT: 0.5 mg/dL (ref 0.2–1.2)
BILIRUBIN TOTAL: 0.6 mg/dL (ref 0.2–1.2)
Globulin: 2.4 g/dL (calc) (ref 1.9–3.7)
Total Protein: 7 g/dL (ref 6.1–8.1)

## 2018-02-14 LAB — HEPATITIS C RNA QUANTITATIVE
HCV QUANT LOG: NOT DETECTED {Log_IU}/mL
HCV RNA, PCR, QN: NOT DETECTED [IU]/mL

## 2018-02-14 NOTE — Telephone Encounter (Signed)
Pt assistance forms for  Linzess were faxed to  [place]. Waiting on approval or denial.

## 2018-02-15 ENCOUNTER — Telehealth: Payer: Self-pay | Admitting: Obstetrics & Gynecology

## 2018-02-15 ENCOUNTER — Other Ambulatory Visit: Payer: Self-pay | Admitting: Obstetrics & Gynecology

## 2018-02-15 MED ORDER — FLUCONAZOLE 100 MG PO TABS: 100.0000 mg | ORAL_TABLET | Freq: Every day | ORAL | 0 refills | Status: DC

## 2018-02-15 NOTE — Telephone Encounter (Signed)
Pt called stating that she would like Dr. Elonda Husky to call her in some Diflucan to her Pharmacy it might take 7-10. Please contact pt

## 2018-02-15 NOTE — Telephone Encounter (Signed)
Sent to [facility]

## 2018-02-15 NOTE — Telephone Encounter (Signed)
Attempted to return pts call. LMOVM. 

## 2018-02-18 NOTE — Progress Notes (Signed)
HFP remains normal. No detectable RNA.

## 2018-02-19 NOTE — Progress Notes (Signed)
Pt is aware of results. Said she has a lot of fatigue and wants to know if she should be on iron. Said she is taking Vit D and that has helped her some. If she needs iron, she cannot afford, she wants to know if they have assistance for that like they do the Linzess. She is working on getting medicaid back and getting disability.

## 2018-02-19 NOTE — Progress Notes (Signed)
Called and many rings and no answer.

## 2018-02-21 ENCOUNTER — Telehealth: Payer: Self-pay | Admitting: Obstetrics & Gynecology

## 2018-02-21 NOTE — Telephone Encounter (Signed)
Patient called, requesting a refill on Diflucan.  She also stated that she is waiting on her Medicaid to come in for a pap/physical.  [facility] [phone]  [phone]

## 2018-02-21 NOTE — Telephone Encounter (Signed)
LMOVM that Diflucan was sent on 12/20 to [facility]. Advised to call pharmacy.

## 2018-02-25 NOTE — Progress Notes (Signed)
No, she does not need iron. Would follow-up with PCP regarding fatigue to rule out other causes.

## 2018-02-26 NOTE — Progress Notes (Signed)
Noted  

## 2018-02-26 NOTE — Progress Notes (Signed)
LMOM to call.

## 2018-03-05 ENCOUNTER — Other Ambulatory Visit: Payer: Self-pay | Admitting: *Deleted

## 2018-03-05 ENCOUNTER — Telehealth: Payer: Self-pay | Admitting: Obstetrics & Gynecology

## 2018-03-05 DIAGNOSIS — K219 Gastro-esophageal reflux disease without esophagitis: Secondary | ICD-10-CM

## 2018-03-05 NOTE — Telephone Encounter (Signed)
Patient requesting refills on omeprazole and would like enough to last until end of year since she is covered until then at [facility]

## 2018-03-05 NOTE — Telephone Encounter (Signed)
Left message letting pt know she can call [facility] and have them transfer prescription to pharmacy of her choice. [place]

## 2018-03-05 NOTE — Telephone Encounter (Signed)
Patient called, stated that that the medication recently called in was called to [facility] should've been called to [facility].  [facility] [phone]  [phone]

## 2018-03-06 MED ORDER — FLUCONAZOLE 100 MG PO TABS: 100.0000 mg | ORAL_TABLET | Freq: Every day | ORAL | 0 refills | Status: DC

## 2018-03-07 MED ORDER — OMEPRAZOLE 20 MG PO CPDR: 20.0000 mg | DELAYED_RELEASE_CAPSULE | Freq: Every morning | ORAL | 11 refills | Status: AC

## 2018-03-07 NOTE — Telephone Encounter (Signed)
Rx sent per patient request  

## 2018-03-07 NOTE — Addendum Note (Signed)
Addended by: Gordy Levan, ERIC A on: 03/07/2018 03:12 PM   Modules accepted: Orders

## 2018-03-12 ENCOUNTER — Other Ambulatory Visit: Payer: Self-pay | Admitting: Pulmonary Disease

## 2018-03-12 ENCOUNTER — Other Ambulatory Visit (HOSPITAL_COMMUNITY): Payer: Self-pay | Admitting: Pulmonary Disease

## 2018-03-12 DIAGNOSIS — J690 Pneumonitis due to inhalation of food and vomit: Secondary | ICD-10-CM

## 2018-03-15 ENCOUNTER — Telehealth: Payer: Self-pay | Admitting: Internal Medicine

## 2018-03-15 NOTE — Telephone Encounter (Signed)
Patient called inquiring if she was approved for the assistance so she can get her linzess.  [phone]

## 2018-03-19 ENCOUNTER — Ambulatory Visit
Payer: Medicaid Other | Attending: Student in an Organized Health Care Education/Training Program | Admitting: Student in an Organized Health Care Education/Training Program

## 2018-03-19 ENCOUNTER — Encounter: Payer: Self-pay | Admitting: Student in an Organized Health Care Education/Training Program

## 2018-03-19 ENCOUNTER — Other Ambulatory Visit: Payer: Self-pay

## 2018-03-19 VITALS — BP 145/88 | HR 118 | Temp 98.5°F | Resp 18 | Ht 62.0 in | Wt 158.2 lb

## 2018-03-19 DIAGNOSIS — M47816 Spondylosis without myelopathy or radiculopathy, lumbar region: Secondary | ICD-10-CM | POA: Diagnosis present

## 2018-03-19 DIAGNOSIS — F1011 Alcohol abuse, in remission: Secondary | ICD-10-CM

## 2018-03-19 DIAGNOSIS — M5416 Radiculopathy, lumbar region: Secondary | ICD-10-CM | POA: Diagnosis present

## 2018-03-19 DIAGNOSIS — G894 Chronic pain syndrome: Secondary | ICD-10-CM | POA: Diagnosis present

## 2018-03-19 DIAGNOSIS — F411 Generalized anxiety disorder: Secondary | ICD-10-CM | POA: Diagnosis present

## 2018-03-19 NOTE — Telephone Encounter (Signed)
Left a message for pt. Pts Linzess has arrived from [place].

## 2018-03-19 NOTE — Patient Instructions (Addendum)
GENERAL RISKS AND COMPLICATIONS  What are the risk, side effects and possible complications? Generally speaking, most procedures are safe.  However, with any procedure there are risks, side effects, and the possibility of complications.  The risks and complications are dependent upon the sites that are lesioned, or the type of nerve block to be performed.  The closer the procedure is to the spine, the more serious the risks are.  Great care is taken when placing the radio frequency needles, block needles or lesioning probes, but sometimes complications can occur. 1. Infection: Any time there is an injection through the skin, there is a risk of infection.  This is why sterile conditions are used for these blocks.  There are four possible types of infection. 1. Localized skin infection. 2. Central Nervous System Infection-This can be in the form of Meningitis, which can be deadly. 3. Epidural Infections-This can be in the form of an epidural abscess, which can cause pressure inside of the spine, causing compression of the spinal cord with subsequent paralysis. This would require an emergency surgery to decompress, and there are no guarantees that the patient would recover from the paralysis. 4. Discitis-This is an infection of the intervertebral discs.  It occurs in about 1% of discography procedures.  It is difficult to treat and it may lead to surgery.        2. Pain: the needles have to go through skin and soft tissues, will cause soreness.       3. Damage to internal structures:  The nerves to be lesioned may be near blood vessels or    other nerves which can be potentially damaged.       4. Bleeding: Bleeding is more common if the patient is taking blood thinners such as  aspirin, Coumadin, Ticiid, Plavix, etc., or if he/she have some genetic predisposition  such as hemophilia. Bleeding into the spinal canal can cause compression of the spinal  cord with subsequent paralysis.  This would require an  emergency surgery to  decompress and there are no guarantees that the patient would recover from the  paralysis.       5. Pneumothorax:  Puncturing of a lung is a possibility, every time a needle is introduced in  the area of the chest or upper back.  Pneumothorax refers to free air around the  collapsed lung(s), inside of the thoracic cavity (chest cavity).  Another two possible  complications related to a similar event would include: Hemothorax and Chylothorax.   These are variations of the Pneumothorax, where instead of air around the collapsed  lung(s), you may have blood or chyle, respectively.       6. Spinal headaches: They may occur with any procedures in the area of the spine.       7. Persistent CSF (Cerebro-Spinal Fluid) leakage: This is a rare problem, but may occur  with prolonged intrathecal or epidural catheters either due to the formation of a fistulous  track or a dural tear.       8. Nerve damage: By working so close to the spinal cord, there is always a possibility of  nerve damage, which could be as serious as a permanent spinal cord injury with  paralysis.       9. Death:  Although rare, severe deadly allergic reactions known as "Anaphylactic  reaction" can occur to any of the medications used.      10. Worsening of the symptoms:  We can always make thing worse.    What are the chances of something like this happening? Chances of any of this occuring are extremely low.  By statistics, you have more of a chance of getting killed in a motor vehicle accident: while driving to the hospital than any of the above occurring .  Nevertheless, you should be aware that they are possibilities.  In general, it is similar to taking a shower.  Everybody knows that you can slip, hit your head and get killed.  Does that mean that you should not shower again?  Nevertheless always keep in mind that statistics do not mean anything if you happen to be on the wrong side of them.  Even if a procedure has a 1  (one) in a 1,000,000 (million) chance of going wrong, it you happen to be that one..Also, keep in mind that by statistics, you have more of a chance of having something go wrong when taking medications.  Who should not have this procedure? If you are on a blood thinning medication (e.g. Coumadin, Plavix, see list of "Blood Thinners"), or if you have an active infection going on, you should not have the procedure.  If you are taking any blood thinners, please inform your physician.  How should I prepare for this procedure?  Do not eat or drink anything at least six hours prior to the procedure.  Bring a driver with you .  It cannot be a taxi.  Come accompanied by an adult that can drive you back, and that is strong enough to help you if your legs get weak or numb from the local anesthetic.  Take all of your medicines the morning of the procedure with just enough water to swallow them.  If you have diabetes, make sure that you are scheduled to have your procedure done first thing in the morning, whenever possible.  If you have diabetes, take only half of your insulin dose and notify our nurse that you have done so as soon as you arrive at the clinic.  If you are diabetic, but only take blood sugar pills (oral hypoglycemic), then do not take them on the morning of your procedure.  You may take them after you have had the procedure.  Do not take aspirin or any aspirin-containing medications, at least eleven (11) days prior to the procedure.  They may prolong bleeding.  Wear loose fitting clothing that may be easy to take off and that you would not mind if it got stained with Betadine or blood.  Do not wear any jewelry or perfume  Remove any nail coloring.  It will interfere with some of our monitoring equipment.  NOTE: Remember that this is not meant to be interpreted as a complete list of all possible complications.  Unforeseen problems may occur.  BLOOD THINNERS The following drugs  contain aspirin or other products, which can cause increased bleeding during surgery and should not be taken for 2 weeks prior to and 1 week after surgery.  If you should need take something for relief of minor pain, you may take acetaminophen which is found in Tylenol,m Datril, Anacin-3 and Panadol. It is not blood thinner. The products listed below are.  Do not take any of the products listed below in addition to any listed on your instruction sheet.  A.P.C or A.P.C with Codeine Codeine Phosphate Capsules #3 Ibuprofen Ridaura  ABC compound Congesprin Imuran rimadil  Advil Cope Indocin Robaxisal  Alka-Seltzer Effervescent Pain Reliever and Antacid Coricidin or Coricidin-D  Indomethacin Rufen    Alka-Seltzer plus Cold Medicine Cosprin Ketoprofen S-A-C Tablets  Anacin Analgesic Tablets or Capsules Coumadin Korlgesic Salflex  Anacin Extra Strength Analgesic tablets or capsules CP-2 Tablets Lanoril Salicylate  Anaprox Cuprimine Capsules Levenox Salocol  Anexsia-D Dalteparin Magan Salsalate  Anodynos Darvon compound Magnesium Salicylate Sine-off  Ansaid Dasin Capsules Magsal Sodium Salicylate  Anturane Depen Capsules Marnal Soma  APF Arthritis pain formula Dewitt's Pills Measurin Stanback  Argesic Dia-Gesic Meclofenamic Sulfinpyrazone  Arthritis Bayer Timed Release Aspirin Diclofenac Meclomen Sulindac  Arthritis pain formula Anacin Dicumarol Medipren Supac  Analgesic (Safety coated) Arthralgen Diffunasal Mefanamic Suprofen  Arthritis Strength Bufferin Dihydrocodeine Mepro Compound Suprol  Arthropan liquid Dopirydamole Methcarbomol with Aspirin Synalgos  ASA tablets/Enseals Disalcid Micrainin Tagament  Ascriptin Doan's Midol Talwin  Ascriptin A/D Dolene Mobidin Tanderil  Ascriptin Extra Strength Dolobid Moblgesic Ticlid  Ascriptin with Codeine Doloprin or Doloprin with Codeine Momentum Tolectin  Asperbuf Duoprin Mono-gesic Trendar  Aspergum Duradyne Motrin or Motrin IB Triminicin  Aspirin  plain, buffered or enteric coated Durasal Myochrisine Trigesic  Aspirin Suppositories Easprin Nalfon Trillsate  Aspirin with Codeine Ecotrin Regular or Extra Strength Naprosyn Uracel  Atromid-S Efficin Naproxen Ursinus  Auranofin Capsules Elmiron Neocylate Vanquish  Axotal Emagrin Norgesic Verin  Azathioprine Empirin or Empirin with Codeine Normiflo Vitamin E  Azolid Emprazil Nuprin Voltaren  Bayer Aspirin plain, buffered or children's or timed BC Tablets or powders Encaprin Orgaran Warfarin Sodium  Buff-a-Comp Enoxaparin Orudis Zorpin  Buff-a-Comp with Codeine Equegesic Os-Cal-Gesic   Buffaprin Excedrin plain, buffered or Extra Strength Oxalid   Bufferin Arthritis Strength Feldene Oxphenbutazone   Bufferin plain or Extra Strength Feldene Capsules Oxycodone with Aspirin   Bufferin with Codeine Fenoprofen Fenoprofen Pabalate or Pabalate-SF   Buffets II Flogesic Panagesic   Buffinol plain or Extra Strength Florinal or Florinal with Codeine Panwarfarin   Buf-Tabs Flurbiprofen Penicillamine   Butalbital Compound Four-way cold tablets Penicillin   Butazolidin Fragmin Pepto-Bismol   Carbenicillin Geminisyn Percodan   Carna Arthritis Reliever Geopen Persantine   Carprofen Gold's salt Persistin   Chloramphenicol Goody's Phenylbutazone   Chloromycetin Haltrain Piroxlcam   Clmetidine heparin Plaquenil   Cllnoril Hyco-pap Ponstel   Clofibrate Hydroxy chloroquine Propoxyphen         Before stopping any of these medications, be sure to consult the physician who ordered them.  Some, such as Coumadin (Warfarin) are ordered to prevent or treat serious conditions such as "deep thrombosis", "pumonary embolisms", and other heart problems.  The amount of time that you may need off of the medication may also vary with the medication and the reason for which you were taking it.  If you are taking any of these medications, please make sure you notify your pain physician before you undergo any  procedures.  Moderate Conscious Sedation, Adult Sedation is the use of medicines to promote relaxation and relieve discomfort and anxiety. Moderate conscious sedation is a type of sedation. Under moderate conscious sedation, you are less alert than normal, but you are still able to respond to instructions, touch, or both. Moderate conscious sedation is used during short medical and dental procedures. It is milder than deep sedation, which is a type of sedation under which you cannot be easily woken up. It is also milder than general anesthesia, which is the use of medicines to make you unconscious. Moderate conscious sedation allows you to return to your regular activities sooner. Tell a health care provider about:  Any allergies you have.  All medicines you are taking, including vitamins, herbs, eye drops, creams,  and over-the-counter medicines.  Use of steroids (by mouth or creams).  Any problems you or family members have had with sedatives and anesthetic medicines.  Any blood disorders you have.  Any surgeries you have had.  Any medical conditions you have, such as sleep apnea.  Whether you are pregnant or may be pregnant.  Any use of cigarettes, alcohol, marijuana, or street drugs. What are the risks? Generally, this is a safe procedure. However, problems may occur, including:  Getting too much medicine (oversedation).  Nausea.  Allergic reaction to medicines.  Trouble breathing. If this happens, a breathing tube may be used to help with breathing. It will be removed when you are awake and breathing on your own.  Heart trouble.  Lung trouble. What happens before the procedure? Staying hydrated Follow instructions from your health care provider about hydration, which may include:  Up to 2 hours before the procedure - you may continue to drink clear liquids, such as water, clear fruit juice, black coffee, and plain tea. Eating and drinking restrictions Follow  instructions from your health care provider about eating and drinking, which may include:  8 hours before the procedure - stop eating heavy meals or foods such as meat, fried foods, or fatty foods.  6 hours before the procedure - stop eating light meals or foods, such as toast or cereal.  6 hours before the procedure - stop drinking milk or drinks that contain milk.  2 hours before the procedure - stop drinking clear liquids. Medicine Ask your health care provider about:  Changing or stopping your regular medicines. This is especially important if you are taking diabetes medicines or blood thinners.  Taking medicines such as aspirin and ibuprofen. These medicines can thin your blood. Do not take these medicines before your procedure if your health care provider instructs you not to.  Tests and exams  You will have a physical exam.  You may have blood tests done to show: ? How well your kidneys and liver are working. ? How well your blood can clot. General instructions  Plan to have someone take you home from the hospital or clinic.  If you will be going home right after the procedure, plan to have someone with you for 24 hours. What happens during the procedure?  An IV tube will be inserted into one of your veins.  Medicine to help you relax (sedative) will be given through the IV tube.  The medical or dental procedure will be performed. What happens after the procedure?  Your blood pressure, heart rate, breathing rate, and blood oxygen level will be monitored often until the medicines you were given have worn off.  Do not drive for 24 hours. This information is not intended to replace advice given to you by your health care provider. Make sure you discuss any questions you have with your health care provider. Document Released: 11/08/2000 Document Revised: 07/20/2015 Document Reviewed: 06/05/2015 [employer]vier Interactive Patient Education  2019 [employer]   Epidural  Steroid Injection Patient Information  Description: The epidural space surrounds the nerves as they exit the spinal cord.  In some patients, the nerves can be compressed and inflamed by a bulging disc or a tight spinal canal (spinal stenosis).  By injecting steroids into the epidural space, we can bring irritated nerves into direct contact with a potentially helpful medication.  These steroids act directly on the irritated nerves and can reduce swelling and inflammation which often leads to decreased pain.  Epidural steroids  may be injected anywhere along the spine and from the neck to the low back depending upon the location of your pain.   After numbing the skin with local anesthetic (like Novocaine), a small needle is passed into the epidural space slowly.  You may experience a sensation of pressure while this is being done.  The entire block usually last less than 10 minutes.  Conditions which may be treated by epidural steroids:   Low back and leg pain  Neck and arm pain  Spinal stenosis  Post-laminectomy syndrome  Herpes zoster (shingles) pain  Pain from compression fractures  Preparation for the injection:  1. Do not eat any solid food or dairy products within 8 hours of your appointment.  2. You may drink clear liquids up to 3 hours before appointment.  Clear liquids include water, black coffee, juice or soda.  No milk or cream please. 3. You may take your regular medication, including pain medications, with a sip of water before your appointment  Diabetics should hold regular insulin (if taken separately) and take 1/2 normal NPH dos the morning of the procedure.  Carry some sugar containing items with you to your appointment. 4. A driver must accompany you and be prepared to drive you home after your procedure.  5. Bring all your current medications with your. 6. An IV may be inserted and sedation may be given at the discretion of the physician.   7. A blood pressure cuff, EKG  and other monitors will often be applied during the procedure.  Some patients may need to have extra oxygen administered for a short period. 8. You will be asked to provide medical information, including your allergies, prior to the procedure.  We must know immediately if you are taking blood thinners (like Coumadin/Warfarin)  Or if you are allergic to IV iodine contrast (dye). We must know if you could possible be pregnant.  Possible side-effects:  Bleeding from needle site  Infection (rare, may require surgery)  Nerve injury (rare)  Numbness & tingling (temporary)  Difficulty urinating (rare, temporary)  Spinal headache ( a headache worse with upright posture)  Light -headedness (temporary)  Pain at injection site (several days)  Decreased blood pressure (temporary)  Weakness in arm/leg (temporary)  Pressure sensation in back/neck (temporary)  Call if you experience:  Fever/chills associated with headache or increased back/neck pain.  Headache worsened by an upright position.  New onset weakness or numbness of an extremity below the injection site  Hives or difficulty breathing (go to the emergency room)  Inflammation or drainage at the infection site  Severe back/neck pain  Any new symptoms which are concerning to you  Please note:  Although the local anesthetic injected can often make your back or neck feel good for several hours after the injection, the pain will likely return.  It takes 3-7 days for steroids to work in the epidural space.  You may not notice any pain relief for at least that one week.  If effective, we will often do a series of three injections spaced 3-6 weeks apart to maximally decrease your pain.  After the initial series, we generally will wait several months before considering a repeat injection of the same type.  If you have any questions, please call [phone] [facility]

## 2018-03-19 NOTE — Progress Notes (Signed)
Safety precautions to be maintained throughout the outpatient stay will include: orient to surroundings, keep bed in low position, maintain call bell within reach at all times, provide assistance with transfer out of bed and ambulation.  

## 2018-03-19 NOTE — Progress Notes (Signed)
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 interpretation table: BMI level Category Range association with higher incidence of chronic pain  <18 kg/m2 Underweight   18.5-24.9 kg/m2 Ideal body weight   25-29.9 kg/m2 Overweight Increased incidence by 20%  30-34.9 kg/m2 Obese (Class I) Increased incidence by 68%  35-39.9 kg/m2 Severe obesity (Class II) Increased incidence by 136%  >40 kg/m2 Extreme obesity (Class III) Increased incidence by 254%   Patient's current BMI Ideal Body weight  Body mass index is 28.94 kg/m. Ideal body weight: 50.1 kg (110 lb 7.2 oz) Adjusted ideal body weight: 58.8 kg (129 lb 8.8 oz)   BMI Readings from Last 4 Encounters:  03/19/18 28.94 kg/m  02/12/18 28.53 kg/m  01/16/18 28.68 kg/m  12/27/17 29.26 kg/m   Wt  Readings from Last 4 Encounters:  03/19/18 158 lb 3.2 oz (71.8 kg)  02/12/18 156 lb (70.8 kg)  01/16/18 156 lb 12.8 oz (71.1 kg)  12/27/17 160 lb (72.6 kg)  Psych/Mental status: Alert, oriented x 3 (person, place, & time)       Eyes: PERLA Respiratory: No evidence of acute respiratory distress  Cervical Spine Area Exam  Skin & Axial Inspection: No masses, redness, edema, swelling, or associated skin lesions Alignment: Symmetrical Functional ROM: Unrestricted ROM      Stability: No instability detected Muscle Tone/Strength: Functionally intact. No obvious neuro-muscular anomalies detected. Sensory (Neurological): Unimpaired Palpation: No palpable anomalies              Upper Extremity (UE) Exam    Side: Right upper extremity  Side: Left upper extremity  Skin & Extremity Inspection: Skin color, temperature, and hair growth are WNL. No peripheral edema or cyanosis. No masses, redness, swelling, asymmetry, or associated skin lesions. No contractures.  Skin & Extremity Inspection: Skin color, temperature, and hair growth are WNL. No peripheral edema or cyanosis. No masses, redness, swelling, asymmetry, or associated skin lesions. No contractures.  Functional ROM: Unrestricted ROM          Functional ROM: Unrestricted ROM          Muscle Tone/Strength: Functionally intact. No obvious neuro-muscular anomalies detected.  Muscle Tone/Strength: Functionally intact. No obvious neuro-muscular anomalies detected.  Sensory (Neurological): Unimpaired          Sensory (Neurological): Unimpaired          Palpation: No palpable anomalies              Palpation: No palpable anomalies              Provocative Test(s):  Phalen's test: deferred Tinel's test: deferred Apley's scratch test (touch opposite shoulder):  Action 1 (Across chest): deferred Action 2 (Overhead): deferred Action 3 (LB reach): deferred   Provocative Test(s):  Phalen's test: deferred Tinel's test: deferred  Apley's scratch test  (touch opposite shoulder):  Action 1 (Across chest): deferred Action 2 (Overhead): deferred Action 3 (LB reach): deferred    Thoracic Spine Area Exam  Skin & Axial Inspection: No masses, redness, or swelling Alignment: Symmetrical Functional ROM: Unrestricted ROM Stability: No instability detected Muscle Tone/Strength: Functionally intact. No obvious neuro-muscular anomalies detected. Sensory (Neurological): Unimpaired Muscle strength & Tone: No palpable anomalies  Lumbar Spine Area Exam  Skin & Axial Inspection: No masses, redness, or swelling Alignment: Symmetrical Functional ROM: Decreased ROM       Stability: No instability detected Muscle Tone/Strength: Functionally intact. No obvious neuro-muscular anomalies detected. Sensory (Neurological): Dermatomal pain pattern and musculoskeletal Palpation: No palpable anomalies       Provocative Tests: Hyperextension/rotation test: (+) bilaterally for facet joint pain. Lumbar quadrant test (Kemp's test): (+) on the right for foraminal stenosis Lateral bending test: deferred today       Patrick's Maneuver: deferred today                   FABER* test: deferred today                   S-I anterior distraction/compression test: deferred today         S-I lateral compression test: deferred today         S-I Thigh-thrust test: deferred today         S-I Gaenslen's test: deferred today         *(Flexion, ABduction and External Rotation)  Gait & Posture Assessment  Ambulation: Unassisted Gait: Relatively normal for age and body habitus Posture: WNL   Lower Extremity Exam    Side: Right lower extremity  Side: Left lower extremity  Stability: No instability observed          Stability: No instability observed          Skin & Extremity Inspection: Skin color, temperature, and hair growth are WNL. No peripheral edema or cyanosis. No masses, redness, swelling, asymmetry, or associated skin lesions. No contractures.  Skin & Extremity  Inspection: Skin color, temperature, and hair growth are WNL. No peripheral edema or cyanosis. No masses, redness, swelling, asymmetry, or associated skin lesions. No contractures.  Functional ROM: Decreased ROM for hip and knee joints          Functional ROM: Unrestricted ROM                  Muscle Tone/Strength: Functionally intact. No obvious neuro-muscular anomalies detected.  Muscle Tone/Strength: Functionally intact. No obvious neuro-muscular anomalies detected.  Sensory (Neurological): Dermatomal pain pattern top of foot & big toe (L5)  Sensory (Neurological): Unimpaired        DTR: Patellar: 2+: normal Achilles: 1+: trace Plantar: deferred today  DTR: Patellar: 2+: normal Achilles: 1+: trace Plantar: deferred today  Palpation: No palpable anomalies  Palpation: No palpable anomalies   Assessment  Primary Diagnosis & Pertinent Problem List: The primary encounter diagnosis was Lumbar radiculopathy (RIGHT). Diagnoses of Lumbar facet arthropathy, Lumbar spondylosis, History of alcohol abuse, Generalized anxiety disorder, and Chronic pain syndrome were also pertinent to this visit.  Visit Diagnosis (New problems to examiner): 1. Lumbar radiculopathy (RIGHT)   2. Lumbar facet arthropathy   3. Lumbar spondylosis   4. History of alcohol abuse   5. Generalized anxiety disorder   6. Chronic pain syndrome    Plan of Care (Initial workup plan)   Axial low back pain with radiation to right lower extremity in  a dermatomal fashion consistent with right lumbar radiculopathy.  Discussed diagnostic right-sided L4-L5 ESI.  Risks and benefits discussed.  Patient like to proceed.  Patient also has lumbar facet arthropathy as well as lumbar spondylosis on most recent lumbar MRI.  If diagnostic right-sided ESI not effective, can consider diagnostic bilateral L3, L4, L5, S1 facet medial branch nerve blocks.  Focus on interventional therapy only.  Patient is not a candidate for chronic opioid therapy  secondary to history of alcohol abuse, generalized anxiety, chronic and daily Xanax usage.  Medication management to continue with PCP.  Ordered Lab-work, Procedure(s), Referral(s), & Consult(s): Orders Placed This Encounter  Procedures  . Lumbar Epidural Injection    Interventional management options: Ms. Formoso was informed that there is no guarantee that she would be a candidate for interventional therapies. The decision will be based on the results of diagnostic studies, as well as Ms. Mccartin's risk profile.  Procedure(s) under consideration:  Right L4-L5 ESI Bilateral L3, L4, L5, S1 facet medial branch nerve blocks   Provider-requested follow-up: Return in about 2 weeks (around 04/02/2018) for Procedure.  Future Appointments  Date Time Provider [place]  04/01/2018 10:00 AM AP-CT 1 [facility]-CT [facility]  04/30/2018  1:30 PM [name], NP [facility]  01/14/2020 10:00 AM [facility] [place] 1 [facility]    Primary Care Physician: Meredith Du, MD Location: [facility] Note by: [name], M.D, Date: 03/19/2018; Time: 3:27 PM  Patient Instructions   GENERAL RISKS AND COMPLICATIONS  What are the risk, side effects and possible complications? Generally speaking, most procedures are safe.  However, with any procedure there are risks, side effects, and the possibility of complications.  The risks and complications are dependent upon the sites that are lesioned, or the type of nerve block to be performed.  The closer the procedure is to the spine, the more serious the risks are.  Great care is taken when placing the radio frequency needles, block needles or lesioning probes, but sometimes complications can occur. 1. Infection: Any time there is an injection through the skin, there is a risk of infection.  This is why sterile conditions are used for these blocks.  There are four possible types of infection. 1. Localized skin  infection. 2. Central Nervous System Infection-This can be in the form of Meningitis, which can be deadly. 3. Epidural Infections-This can be in the form of an epidural abscess, which can cause pressure inside of the spine, causing compression of the spinal cord with subsequent paralysis. This would require an emergency surgery to decompress, and there are no guarantees that the patient would recover from the paralysis. 4. Discitis-This is an infection of the intervertebral discs.  It occurs in about 1% of discography procedures.  It is difficult to treat and it may lead to surgery.        2. Pain: the needles have to go through skin and soft tissues, will cause soreness.       3. Damage to internal structures:  The nerves to be lesioned may be near blood vessels or    other nerves which can be potentially damaged.       4. Bleeding: Bleeding is more common if the patient is taking blood thinners such as  aspirin, Coumadin, Ticiid, Plavix, etc., or if he/she have some genetic predisposition  such as hemophilia. Bleeding into the spinal canal can cause compression of the spinal  cord with subsequent paralysis.  This  would require an emergency surgery to  decompress and there are no guarantees that the patient would recover from the  paralysis.       5. Pneumothorax:  Puncturing of a lung is a possibility, every time a needle is introduced in  the area of the chest or upper back.  Pneumothorax refers to free air around the  collapsed lung(s), inside of the thoracic cavity (chest cavity).  Another two possible  complications related to a similar event would include: Hemothorax and Chylothorax.   These are variations of the Pneumothorax, where instead of air around the collapsed  lung(s), you may have blood or chyle, respectively.       6. Spinal headaches: They may occur with any procedures in the area of the spine.       7. Persistent CSF (Cerebro-Spinal Fluid) leakage: This is a rare problem, but may occur   with prolonged intrathecal or epidural catheters either due to the formation of a fistulous  track or a dural tear.       8. Nerve damage: By working so close to the spinal cord, there is always a possibility of  nerve damage, which could be as serious as a permanent spinal cord injury with  paralysis.       9. Death:  Although rare, severe deadly allergic reactions known as "Anaphylactic  reaction" can occur to any of the medications used.      10. Worsening of the symptoms:  We can always make thing worse.  What are the chances of something like this happening? Chances of any of this occuring are extremely low.  By statistics, you have more of a chance of getting killed in a motor vehicle accident: while driving to the hospital than any of the above occurring .  Nevertheless, you should be aware that they are possibilities.  In general, it is similar to taking a shower.  Everybody knows that you can slip, hit your head and get killed.  Does that mean that you should not shower again?  Nevertheless always keep in mind that statistics do not mean anything if you happen to be on the wrong side of them.  Even if a procedure has a 1 (one) in a 1,000,000 (million) chance of going wrong, it you happen to be that one..Also, keep in mind that by statistics, you have more of a chance of having something go wrong when taking medications.  Who should not have this procedure? If you are on a blood thinning medication (e.g. Coumadin, Plavix, see list of "Blood Thinners"), or if you have an active infection going on, you should not have the procedure.  If you are taking any blood thinners, please inform your physician.  How should I prepare for this procedure?  Do not eat or drink anything at least six hours prior to the procedure.  Bring a driver with you .  It cannot be a taxi.  Come accompanied by an adult that can drive you back, and that is strong enough to help you if your legs get weak or numb from the  local anesthetic.  Take all of your medicines the morning of the procedure with just enough water to swallow them.  If you have diabetes, make sure that you are scheduled to have your procedure done first thing in the morning, whenever possible.  If you have diabetes, take only half of your insulin dose and notify our nurse that you have done so as soon as  you arrive at the clinic.  If you are diabetic, but only take blood sugar pills (oral hypoglycemic), then do not take them on the morning of your procedure.  You may take them after you have had the procedure.  Do not take aspirin or any aspirin-containing medications, at least eleven (11) days prior to the procedure.  They may prolong bleeding.  Wear loose fitting clothing that may be easy to take off and that you would not mind if it got stained with Betadine or blood.  Do not wear any jewelry or perfume  Remove any nail coloring.  It will interfere with some of our monitoring equipment.  NOTE: Remember that this is not meant to be interpreted as a complete list of all possible complications.  Unforeseen problems may occur.  BLOOD THINNERS The following drugs contain aspirin or other products, which can cause increased bleeding during surgery and should not be taken for 2 weeks prior to and 1 week after surgery.  If you should need take something for relief of minor pain, you may take acetaminophen which is found in Tylenol,m Datril, Anacin-3 and Panadol. It is not blood thinner. The products listed below are.  Do not take any of the products listed below in addition to any listed on your instruction sheet.  A.P.C or A.P.C with Codeine Codeine Phosphate Capsules #3 Ibuprofen Ridaura  ABC compound Congesprin Imuran rimadil  Advil Cope Indocin Robaxisal  Alka-Seltzer Effervescent Pain Reliever and Antacid Coricidin or Coricidin-D  Indomethacin Rufen  Alka-Seltzer plus Cold Medicine Cosprin Ketoprofen S-A-C Tablets  Anacin Analgesic  Tablets or Capsules Coumadin Korlgesic Salflex  Anacin Extra Strength Analgesic tablets or capsules CP-2 Tablets Lanoril Salicylate  Anaprox Cuprimine Capsules Levenox Salocol  Anexsia-D Dalteparin Magan Salsalate  Anodynos Darvon compound Magnesium Salicylate Sine-off  Ansaid Dasin Capsules Magsal Sodium Salicylate  Anturane Depen Capsules Marnal Soma  APF Arthritis pain formula Dewitt's Pills Measurin Stanback  Argesic Dia-Gesic Meclofenamic Sulfinpyrazone  Arthritis Bayer Timed Release Aspirin Diclofenac Meclomen Sulindac  Arthritis pain formula Anacin Dicumarol Medipren Supac  Analgesic (Safety coated) Arthralgen Diffunasal Mefanamic Suprofen  Arthritis Strength Bufferin Dihydrocodeine Mepro Compound Suprol  Arthropan liquid Dopirydamole Methcarbomol with Aspirin Synalgos  ASA tablets/Enseals Disalcid Micrainin Tagament  Ascriptin Doan's Midol Talwin  Ascriptin A/D Dolene Mobidin Tanderil  Ascriptin Extra Strength Dolobid Moblgesic Ticlid  Ascriptin with Codeine Doloprin or Doloprin with Codeine Momentum Tolectin  Asperbuf Duoprin Mono-gesic Trendar  Aspergum Duradyne Motrin or Motrin IB Triminicin  Aspirin plain, buffered or enteric coated Durasal Myochrisine Trigesic  Aspirin Suppositories Easprin Nalfon Trillsate  Aspirin with Codeine Ecotrin Regular or Extra Strength Naprosyn Uracel  Atromid-S Efficin Naproxen Ursinus  Auranofin Capsules Elmiron Neocylate Vanquish  Axotal Emagrin Norgesic Verin  Azathioprine Empirin or Empirin with Codeine Normiflo Vitamin E  Azolid Emprazil Nuprin Voltaren  Bayer Aspirin plain, buffered or children's or timed BC Tablets or powders Encaprin Orgaran Warfarin Sodium  Buff-a-Comp Enoxaparin Orudis Zorpin  Buff-a-Comp with Codeine Equegesic Os-Cal-Gesic   Buffaprin Excedrin plain, buffered or Extra Strength Oxalid   Bufferin Arthritis Strength Feldene Oxphenbutazone   Bufferin plain or Extra Strength Feldene Capsules Oxycodone with Aspirin    Bufferin with Codeine Fenoprofen Fenoprofen Pabalate or Pabalate-SF   Buffets II Flogesic Panagesic   Buffinol plain or Extra Strength Florinal or Florinal with Codeine Panwarfarin   Buf-Tabs Flurbiprofen Penicillamine   Butalbital Compound Four-way cold tablets Penicillin   Butazolidin Fragmin Pepto-Bismol   Carbenicillin Geminisyn Percodan   Carna Arthritis Reliever Geopen Persantine  Carprofen Gold's salt Persistin   Chloramphenicol Goody's Phenylbutazone   Chloromycetin Haltrain Piroxlcam   Clmetidine heparin Plaquenil   Cllnoril Hyco-pap Ponstel   Clofibrate Hydroxy chloroquine Propoxyphen         Before stopping any of these medications, be sure to consult the physician who ordered them.  Some, such as Coumadin (Warfarin) are ordered to prevent or treat serious conditions such as "deep thrombosis", "pumonary embolisms", and other heart problems.  The amount of time that you may need off of the medication may also vary with the medication and the reason for which you were taking it.  If you are taking any of these medications, please make sure you notify your pain physician before you undergo any procedures.  Moderate Conscious Sedation, Adult Sedation is the use of medicines to promote relaxation and relieve discomfort and anxiety. Moderate conscious sedation is a type of sedation. Under moderate conscious sedation, you are less alert than normal, but you are still able to respond to instructions, touch, or both. Moderate conscious sedation is used during short medical and dental procedures. It is milder than deep sedation, which is a type of sedation under which you cannot be easily woken up. It is also milder than general anesthesia, which is the use of medicines to make you unconscious. Moderate conscious sedation allows you to return to your regular activities sooner. Tell a health care provider about:  Any allergies you have.  All medicines you are taking, including vitamins,  herbs, eye drops, creams, and over-the-counter medicines.  Use of steroids (by mouth or creams).  Any problems you or family members have had with sedatives and anesthetic medicines.  Any blood disorders you have.  Any surgeries you have had.  Any medical conditions you have, such as sleep apnea.  Whether you are pregnant or may be pregnant.  Any use of cigarettes, alcohol, marijuana, or street drugs. What are the risks? Generally, this is a safe procedure. However, problems may occur, including:  Getting too much medicine (oversedation).  Nausea.  Allergic reaction to medicines.  Trouble breathing. If this happens, a breathing tube may be used to help with breathing. It will be removed when you are awake and breathing on your own.  Heart trouble.  Lung trouble. What happens before the procedure? Staying hydrated Follow instructions from your health care provider about hydration, which may include:  Up to 2 hours before the procedure - you may continue to drink clear liquids, such as water, clear fruit juice, black coffee, and plain tea. Eating and drinking restrictions Follow instructions from your health care provider about eating and drinking, which may include:  8 hours before the procedure - stop eating heavy meals or foods such as meat, fried foods, or fatty foods.  6 hours before the procedure - stop eating light meals or foods, such as toast or cereal.  6 hours before the procedure - stop drinking milk or drinks that contain milk.  2 hours before the procedure - stop drinking clear liquids. Medicine Ask your health care provider about:  Changing or stopping your regular medicines. This is especially important if you are taking diabetes medicines or blood thinners.  Taking medicines such as aspirin and ibuprofen. These medicines can thin your blood. Do not take these medicines before your procedure if your health care provider instructs you not to.  Tests and  exams  You will have a physical exam.  You may have blood tests done to show: ? How well  your kidneys and liver are working. ? How well your blood can clot. General instructions  Plan to have someone take you home from the hospital or clinic.  If you will be going home right after the procedure, plan to have someone with you for 24 hours. What happens during the procedure?  An IV tube will be inserted into one of your veins.  Medicine to help you relax (sedative) will be given through the IV tube.  The medical or dental procedure will be performed. What happens after the procedure?  Your blood pressure, heart rate, breathing rate, and blood oxygen level will be monitored often until the medicines you were given have worn off.  Do not drive for 24 hours. This information is not intended to replace advice given to you by your health care provider. Make sure you discuss any questions you have with your health care provider. Document Released: 11/08/2000 Document Revised: 07/20/2015 Document Reviewed: 06/05/2015 Elsevier Interactive Patient Education  2019 [employer]   Epidural Steroid Injection Patient Information  Description: The epidural space surrounds the nerves as they exit the spinal cord.  In some patients, the nerves can be compressed and inflamed by a bulging disc or a tight spinal canal (spinal stenosis).  By injecting steroids into the epidural space, we can bring irritated nerves into direct contact with a potentially helpful medication.  These steroids act directly on the irritated nerves and can reduce swelling and inflammation which often leads to decreased pain.  Epidural steroids may be injected anywhere along the spine and from the neck to the low back depending upon the location of your pain.   After numbing the skin with local anesthetic (like Novocaine), a small needle is passed into the epidural space slowly.  You may experience a sensation of pressure while  this is being done.  The entire block usually last less than 10 minutes.  Conditions which may be treated by epidural steroids:   Low back and leg pain  Neck and arm pain  Spinal stenosis  Post-laminectomy syndrome  Herpes zoster (shingles) pain  Pain from compression fractures  Preparation for the injection:  1. Do not eat any solid food or dairy products within 8 hours of your appointment.  2. You may drink clear liquids up to 3 hours before appointment.  Clear liquids include water, black coffee, juice or soda.  No milk or cream please. 3. You may take your regular medication, including pain medications, with a sip of water before your appointment  Diabetics should hold regular insulin (if taken separately) and take 1/2 normal NPH dos the morning of the procedure.  Carry some sugar containing items with you to your appointment. 4. A driver must accompany you and be prepared to drive you home after your procedure.  5. Bring all your current medications with your. 6. An IV may be inserted and sedation may be given at the discretion of the physician.   7. A blood pressure cuff, EKG and other monitors will often be applied during the procedure.  Some patients may need to have extra oxygen administered for a short period. 8. You will be asked to provide medical information, including your allergies, prior to the procedure.  We must know immediately if you are taking blood thinners (like Coumadin/Warfarin)  Or if you are allergic to IV iodine contrast (dye). We must know if you could possible be pregnant.  Possible side-effects:  Bleeding from needle site  Infection (rare, may require surgery)  Nerve injury (rare)  Numbness & tingling (temporary)  Difficulty urinating (rare, temporary)  Spinal headache ( a headache worse with upright posture)  Light -headedness (temporary)  Pain at injection site (several days)  Decreased blood pressure (temporary)  Weakness in arm/leg  (temporary)  Pressure sensation in back/neck (temporary)  Call if you experience:  Fever/chills associated with headache or increased back/neck pain.  Headache worsened by an upright position.  New onset weakness or numbness of an extremity below the injection site  Hives or difficulty breathing (go to the emergency room)  Inflammation or drainage at the infection site  Severe back/neck pain  Any new symptoms which are concerning to you  Please note:  Although the local anesthetic injected can often make your back or neck feel good for several hours after the injection, the pain will likely return.  It takes 3-7 days for steroids to work in the epidural space.  You may not notice any pain relief for at least that one week.  If effective, we will often do a series of three injections spaced 3-6 weeks apart to maximally decrease your pain.  After the initial series, we generally will wait several months before considering a repeat injection of the same type.  If you have any questions, please call [phone] [facility]

## 2018-03-19 NOTE — Telephone Encounter (Signed)
Pt returned call. Pt is aware that her medication arrived.

## 2018-04-01 ENCOUNTER — Ambulatory Visit (HOSPITAL_COMMUNITY)
Admission: RE | Admit: 2018-04-01 | Discharge: 2018-04-01 | Disposition: A | Discharge status: Home / Self Care | Payer: Medicaid Other | Source: Ambulatory Visit | Attending: Pulmonary Disease | Admitting: Pulmonary Disease

## 2018-04-01 DIAGNOSIS — J690 Pneumonitis due to inhalation of food and vomit: Secondary | ICD-10-CM | POA: Diagnosis not present

## 2018-04-01 MED ORDER — IOPAMIDOL (ISOVUE-300) INJECTION 61%
100.0000 mL | Freq: Once | INTRAVENOUS | Status: AC | PRN
  Administered 2018-04-01: 75 mL via INTRAVENOUS

## 2018-04-10 ENCOUNTER — Telehealth: Payer: Self-pay | Admitting: Internal Medicine

## 2018-04-10 NOTE — Telephone Encounter (Signed)
Recall for US abdomen

## 2018-04-10 NOTE — Telephone Encounter (Signed)
Recall sent 

## 2018-04-15 ENCOUNTER — Encounter: Payer: Self-pay | Admitting: Student in an Organized Health Care Education/Training Program

## 2018-04-15 ENCOUNTER — Ambulatory Visit (HOSPITAL_BASED_OUTPATIENT_CLINIC_OR_DEPARTMENT_OTHER): Payer: Medicaid Other | Admitting: Student in an Organized Health Care Education/Training Program

## 2018-04-15 ENCOUNTER — Other Ambulatory Visit: Payer: Self-pay

## 2018-04-15 ENCOUNTER — Ambulatory Visit
Admission: RE | Admit: 2018-04-15 | Discharge: 2018-04-15 | Disposition: A | Discharge status: Home / Self Care | Payer: Medicaid Other | Source: Ambulatory Visit | Attending: Student in an Organized Health Care Education/Training Program | Admitting: Student in an Organized Health Care Education/Training Program

## 2018-04-15 VITALS — BP 127/76 | HR 98 | Temp 98.3°F | Resp 18 | Ht 62.0 in | Wt 158.0 lb

## 2018-04-15 DIAGNOSIS — M5416 Radiculopathy, lumbar region: Secondary | ICD-10-CM | POA: Insufficient documentation

## 2018-04-15 MED ORDER — LACTATED RINGERS IV SOLN
1000.0000 mL | Freq: Once | INTRAVENOUS | Status: DC
  Administered 2018-04-15: 1000 mL via INTRAVENOUS

## 2018-04-15 MED ORDER — DEXAMETHASONE SODIUM PHOSPHATE 10 MG/ML IJ SOLN
10.0000 mg | Freq: Once | INTRAMUSCULAR | Status: AC
  Administered 2018-04-15: 10 mg

## 2018-04-15 MED ORDER — SODIUM CHLORIDE (PF) 0.9 % IJ SOLN
INTRAMUSCULAR | Status: AC
  Filled 2018-04-15: qty 10

## 2018-04-15 MED ORDER — ROPIVACAINE HCL 2 MG/ML IJ SOLN
INTRAMUSCULAR | Status: AC
  Filled 2018-04-15: qty 10

## 2018-04-15 MED ORDER — SODIUM CHLORIDE 0.9% FLUSH
2.0000 mL | Freq: Once | INTRAVENOUS | Status: AC
  Administered 2018-04-15: 2 mL

## 2018-04-15 MED ORDER — IOPAMIDOL (ISOVUE-M 200) INJECTION 41%
INTRAMUSCULAR | Status: AC
  Filled 2018-04-15: qty 10

## 2018-04-15 MED ORDER — MIDAZOLAM HCL 5 MG/5ML IJ SOLN
1.0000 mg | INTRAMUSCULAR | Status: DC | PRN
  Administered 2018-04-15: 1 mg via INTRAVENOUS

## 2018-04-15 MED ORDER — ROPIVACAINE HCL 2 MG/ML IJ SOLN
2.0000 mL | Freq: Once | INTRAMUSCULAR | Status: AC
  Administered 2018-04-15: 10 mL via EPIDURAL

## 2018-04-15 MED ORDER — LIDOCAINE HCL 2 % IJ SOLN
10.0000 mL | Freq: Once | INTRAMUSCULAR | Status: AC
  Administered 2018-04-15: 400 mg

## 2018-04-15 MED ORDER — LIDOCAINE HCL 2 % IJ SOLN
INTRAMUSCULAR | Status: AC
  Filled 2018-04-15: qty 20

## 2018-04-15 MED ORDER — IOPAMIDOL (ISOVUE-M 200) INJECTION 41%
10.0000 mL | Freq: Once | INTRAMUSCULAR | Status: AC
  Administered 2018-04-15: 10 mL via EPIDURAL

## 2018-04-15 MED ORDER — MIDAZOLAM HCL 5 MG/5ML IJ SOLN
INTRAMUSCULAR | Status: AC
  Filled 2018-04-15: qty 5

## 2018-04-15 MED ORDER — DEXAMETHASONE SODIUM PHOSPHATE 10 MG/ML IJ SOLN
INTRAMUSCULAR | Status: AC
  Filled 2018-04-15: qty 1

## 2018-04-15 NOTE — Progress Notes (Signed)
Safety precautions to be maintained throughout the outpatient stay will include: orient to surroundings, keep bed in low position, maintain call bell within reach at all times, provide assistance with transfer out of bed and ambulation.  

## 2018-04-15 NOTE — Progress Notes (Signed)
Patient's Name: [name]  MRN: [id number]  Referring Provider: Sinda Du, MD  DOB: 1960/04/02  PCP: Sinda Du, MD  DOS: 04/15/2018  Note by: Gillis Santa, MD  Service setting: Ambulatory outpatient  Specialty: Interventional Pain Management  Patient type: Established  Location: [facility] (AMB) Pain Management Facility  Visit type: Interventional Procedure   Primary Reason for Visit: Interventional Pain Management Treatment. CC: Back Pain (lower)  Procedure:          Anesthesia, Analgesia, Anxiolysis:  Type: Diagnostic Inter-Laminar Epidural Steroid Injection  #1  Region: Lumbar Level: L4-5 Level. Laterality: Right-Sided          **ABORTED due to severe patient anxiety and panic attack (shortness of breath and indication that she did not want to continue. This is after 0.5 mg of IV Versed x 2. Unable to continue. Had this rxn when injection local  Type: Moderate (Conscious) Sedation combined with Local Anesthesia Indication(s): Analgesia and Anxiety Route: Intravenous (IV) IV Access: Secured Sedation: Meaningful verbal contact was maintained at all times during the procedure  Local Anesthetic: Lidocaine 1-2%  Position: Prone with head of the table was raised to facilitate breathing.   Indications: 1. Lumbar radiculopathy (RIGHT)    Pain Score: Pre-procedure: 9 /10 Post-procedure: 9 /10  Pre-op Assessment:  [name] is a 58 y.o. (year old), female patient, seen today for interventional treatment. She  has a past surgical history that includes Nose surgery; Left wrist repair; Esophagogastroduodenoscopy (10/17/2010); maloney dilation (10/17/2010); Savory dilation (10/17/2010); Liver biopsy; Colonoscopy (05/24/2011); Esophagogastroduodenoscopy (05/24/2011); Tubal ligation; Endometrial ablation (06/2011); Bladder suspension (01/16/2012); Multiple extractions with alveoloplasty (N/A, 01/06/2013); Mouth surgery (12/2012); Hemorrhoid banding; transvaginal mesh; Cholecystectomy (N/A,  01/02/2014); Liver biopsy (N/A, 01/02/2014); Cystoscopy; Esophagogastroduodenoscopy (egd) with propofol (N/A, 12/25/2016); maloney dilation (N/A, 12/25/2016); biopsy (12/25/2016); Colonoscopy with propofol (N/A, 11/26/2017); and polypectomy (11/26/2017). [name] has a current medication list which includes the following prescription(s): alprazolam, amlodipine, amphetamine-dextroamphetamine, aspirin ec, atorvastatin, cephalexin, cetirizine, vitamin d, cyclobenzaprine, detrol la, estradiol, linaclotide, medroxyprogesterone, meloxicam, menthol-methyl salicylate, mometasone, omeprazole, ondansetron, polyethylene glycol powder, proventil hfa, sennosides-docusate sodium, sertraline, vitamin c, acetaminophen, gabapentin, and inulin, and the following Facility-Administered Medications: midazolam. Her primarily concern today is the Back Pain (lower)  Initial Vital Signs:  Pulse/HCG Rate: 60  Temp: 98 F (36.7 C) Resp: 18 BP: 136/88 SpO2: 100 %  BMI: Estimated body mass index is 28.9 kg/m as calculated from the following:   Height as of this encounter: 5\' 2"  (1.575 m).   Weight as of this encounter: 158 lb (71.7 kg).  Risk Assessment: Allergies: Reviewed. She is allergic to penicillins.  Allergy Precautions: None required Coagulopathies: Reviewed. None identified.  Blood-thinner therapy: None at this time Active Infection(s): Reviewed. None identified. [name] is afebrile  Site Confirmation: [name] was asked to confirm the procedure and laterality before marking the site Procedure checklist: Completed Consent: Before the procedure and under the influence of no sedative(s), amnesic(s), or anxiolytics, the patient was informed of the treatment options, risks and possible complications. To fulfill our ethical and legal obligations, as recommended by the American Medical Association's Code of Ethics, I have informed the patient of my clinical impression; the nature and purpose of the treatment or  procedure; the risks, benefits, and possible complications of the intervention; the alternatives, including doing nothing; the risk(s) and benefit(s) of the alternative treatment(s) or procedure(s); and the risk(s) and benefit(s) of doing nothing. The patient was provided information about the general risks and possible complications associated with the procedure.  These may include, but are not limited to: failure to achieve desired goals, infection, bleeding, organ or nerve damage, allergic reactions, paralysis, and death. In addition, the patient was informed of those risks and complications associated to Spine-related procedures, such as failure to decrease pain; infection (i.e.: Meningitis, epidural or intraspinal abscess); bleeding (i.e.: epidural hematoma, subarachnoid hemorrhage, or any other type of intraspinal or peri-dural bleeding); organ or nerve damage (i.e.: Any type of peripheral nerve, nerve root, or spinal cord injury) with subsequent damage to sensory, motor, and/or autonomic systems, resulting in permanent pain, numbness, and/or weakness of one or several areas of the body; allergic reactions; (i.e.: anaphylactic reaction); and/or death. Furthermore, the patient was informed of those risks and complications associated with the medications. These include, but are not limited to: allergic reactions (i.e.: anaphylactic or anaphylactoid reaction(s)); adrenal axis suppression; blood sugar elevation that in diabetics may result in ketoacidosis or comma; water retention that in patients with history of congestive heart failure may result in shortness of breath, pulmonary edema, and decompensation with resultant heart failure; weight gain; swelling or edema; medication-induced neural toxicity; particulate matter embolism and blood vessel occlusion with resultant organ, and/or nervous system infarction; and/or aseptic necrosis of one or more joints. Finally, the patient was informed that Medicine is  not an exact science; therefore, there is also the possibility of unforeseen or unpredictable risks and/or possible complications that may result in a catastrophic outcome. The patient indicated having understood very clearly. We have given the patient no guarantees and we have made no promises. Enough time was given to the patient to ask questions, all of which were answered to the patient's satisfaction. Ms. Armstead has indicated that she wanted to continue with the procedure. Attestation: I, the ordering provider, attest that I have discussed with the patient the benefits, risks, side-effects, alternatives, likelihood of achieving goals, and potential problems during recovery for the procedure that I have provided informed consent. Date  Time: 04/15/2018  9:20 AM  Pre-Procedure Preparation:  Monitoring: As per clinic protocol. Respiration, ETCO2, SpO2, BP, heart rate and rhythm monitor placed and checked for adequate function Safety Precautions: Patient was assessed for positional comfort and pressure points before starting the procedure. Time-out: I initiated and conducted the "Time-out" before starting the procedure, as per protocol. The patient was asked to participate by confirming the accuracy of the "Time Out" information. Verification of the correct person, site, and procedure were performed and confirmed by me, the nursing staff, and the patient. "Time-out" conducted as per Joint Commission's Universal Protocol (UP.01.01.01). Time: 1002  Description of Procedure:          Target Area: The interlaminar space, initially targeting the lower laminar border of the superior vertebral body. Approach: Paramedial approach. Area Prepped: Entire Posterior Lumbar Region Prepping solution: ChloraPrep (2% chlorhexidine gluconate and 70% isopropyl alcohol) Safety Precautions: Aspiration looking for blood return was conducted prior to all injections. At no point did we inject any substances, as a needle was  being advanced. No attempts were made at seeking any paresthesias. Safe injection practices and needle disposal techniques used. Medications properly checked for expiration dates. SDV (single dose vial) medications used.  ABORTED due to severe patient anxiety and panic attack (shortness of breath and indication that she did not want to continue. This is after 0.5 mg of IV Versed x 2. Unable to continue. Had this rxn when injection local. Patient has history of chronic, severe anxiety. Unable to tolerate any interventional options.   Vitals:  04/15/18 1000 04/15/18 1005 04/15/18 1015 04/15/18 1025  BP: 113/80 130/79 133/84 127/76  Pulse: 91 90 89 98  Resp: 15 18 18    Temp:  98.5 F (36.9 C)  98.3 F (36.8 C)  TempSrc:    Oral  SpO2: 100% 100% 100% 100%  Weight:      Height:        Start Time: 1002 hrs. End Time: 1005 hrs.    Post-operative Assessment:  Post-procedure Vital Signs:  Pulse/HCG Rate: 98  Temp: 98.3 F (36.8 C) Resp: 18 BP: 127/76 SpO2: 100 %  Patient symptoms improved in recovery room. Plan of Care   Imaging Orders     DG C-Arm 1-60 Min-No Report Procedure Orders    No procedure(s) ordered today    In terms of plan, given patient's severe anxiety and panic attack, I do not feel that the patient will be a candidate for any interventional procedure options.  Furthermore she will not be a candidate for chronic opioid therapy given her psychiatric illness, use of chronic daily Xanax, history of alcohol abuse, history of DUI.  Patient can follow-up with her primary care provider for non-opioid-based medication management.  Recommend physical therapy which the patient declined.  Do not have anything else to offer the patient.   Disposition: Discharge home  Discharge Date & Time: 04/15/2018; 1026 hrs.    Future Appointments  Date Time Provider [place]  04/30/2018  1:30 PM [name], NP [facility]  01/14/2020 10:00 AM [facility] [place] 1 [facility]   Primary Care Physician: Sinda Du, MD Location: [facility] Outpatient [facility] Management Facility Note by: Gillis Santa, MD Date: 04/15/2018; Time: 11:00 AM  Disclaimer:  Medicine is not an exact science. The only guarantee in medicine is that nothing is guaranteed. It is important to note that the decision to proceed with this intervention was based on the information collected from the patient. The Data and conclusions were drawn from the patient's questionnaire, the interview, and the physical examination. Because the information was provided in large part by the patient, it cannot be guaranteed that it has not been purposely or unconsciously manipulated. Every effort has been made to obtain as much relevant data as possible for this evaluation. It is important to note that the conclusions that lead to this procedure are derived in large part from the available data. Always take into account that the treatment will also be dependent on availability of resources and existing treatment guidelines, considered by other Pain Management Practitioners as being common knowledge and practice, at the time of the intervention. For Medico-Legal purposes, it is also important to point out that variation in procedural techniques and pharmacological choices are the acceptable norm. The indications, contraindications, technique, and results of the above procedure should only be interpreted and judged by a Board-Certified Interventional Pain Specialist with extensive familiarity and expertise in the same exact procedure and technique.

## 2018-04-16 ENCOUNTER — Telehealth: Payer: Self-pay

## 2018-04-16 ENCOUNTER — Telehealth: Payer: Self-pay | Admitting: Orthopedic Surgery

## 2018-04-16 ENCOUNTER — Telehealth: Payer: Self-pay | Admitting: Internal Medicine

## 2018-04-16 DIAGNOSIS — B182 Chronic viral hepatitis C: Secondary | ICD-10-CM

## 2018-04-16 NOTE — Telephone Encounter (Signed)
No one takes her charity care for injections, I have advised her multiple times. She should follow up with her PCP for any medication changes, Dr Aline Brochure has nothing further to offer other than referring for injections, which she cannot afford. I have advised her of this multiple times in the past, I called again, but voice mail box not set up. I have apologized to her, and discussed with Dr Aline Brochure also, but there is nothing more he can offer.

## 2018-04-16 NOTE — Telephone Encounter (Signed)
Pt received a letter to call and schedule her abdominal U/S. Please call [phone]

## 2018-04-16 NOTE — Telephone Encounter (Signed)
Patient called upset and crying, stating that she could not do the shots in her back. Her last visit with our office was 07/09/17. I asked why she waited so long to get the injection and she stated that she has to find someone to take her to appointments. She was wanting something for pain and I told her that she would probably have to be seen before any medication could be prescribed.   Please review note, call and advise what patient's next step is.

## 2018-04-16 NOTE — Telephone Encounter (Signed)
Post procedure phone call.  Patient informed me that we did not finish her procedure due to her having a panic attack.

## 2018-04-17 NOTE — Telephone Encounter (Signed)
Pt called office, informed of [place] appt. Letter mailed.

## 2018-04-17 NOTE — Telephone Encounter (Signed)
AB advised pt needs [place] abd RUQ. [place] scheduled for 04/29/18 at 10:30am, arrive at 10:15am. NPO after midnight. (she has OV w/AB 04/30/18). Tried to call pt, no answer, unable to leave VM d/t mailbox hasn't been set-up.

## 2018-04-17 NOTE — Telephone Encounter (Signed)
Patient called and I explained what Amy has noted below. Stated ok and hung up.

## 2018-04-29 ENCOUNTER — Ambulatory Visit (HOSPITAL_COMMUNITY): Payer: Medicaid Other

## 2018-04-29 NOTE — Progress Notes (Deleted)
58 year old female with history of symptomatic grade 2-3 hemorrhoids, s/p prior banding of right posterior and right anterior. Presenting today to discuss left lateral banding.

## 2018-04-29 NOTE — Telephone Encounter (Signed)
Note sent to nurse. 

## 2018-04-30 ENCOUNTER — Encounter: Payer: Self-pay | Admitting: Gastroenterology

## 2018-05-09 ENCOUNTER — Ambulatory Visit (HOSPITAL_COMMUNITY)
Admission: RE | Admit: 2018-05-09 | Discharge: 2018-05-09 | Disposition: A | Discharge status: Home / Self Care | Payer: Medicaid Other | Source: Ambulatory Visit | Attending: Gastroenterology | Admitting: Gastroenterology

## 2018-05-09 ENCOUNTER — Other Ambulatory Visit: Payer: Self-pay

## 2018-05-09 DIAGNOSIS — B182 Chronic viral hepatitis C: Secondary | ICD-10-CM | POA: Diagnosis not present

## 2018-05-16 NOTE — Progress Notes (Signed)
Fatty liver. Chronically dilated CBD in setting of post-cholecystectomy, normal LFTs. Would repeat US yearly.

## 2018-05-27 ENCOUNTER — Telehealth: Payer: Self-pay | Admitting: Internal Medicine

## 2018-05-27 NOTE — Telephone Encounter (Signed)
Pt has questions about her LInzess [phone]

## 2018-05-27 NOTE — Telephone Encounter (Signed)
Tried calling pt. VM not set up. Will call pt back. Spoke with [employer] and they can only have medication ship to providers office. They can have it shipped to a pharmacy with a letter written as to why it must go there.

## 2018-05-27 NOTE — Telephone Encounter (Signed)
Normally linzess will only ship to the office. You can try to call the company to see if they will ship it to her home.

## 2018-05-27 NOTE — Telephone Encounter (Signed)
Pt receives pt assistance from [place] for [place]. Pt's next appoint with our office is 07/2018. Pt doesn't have a vehicle and wants to know if we can have her medication shipped to her home? Please advise.

## 2018-05-28 ENCOUNTER — Encounter: Payer: Medicaid Other | Admitting: Gastroenterology

## 2018-06-03 NOTE — Telephone Encounter (Signed)
Tried calling pt. Will call her back.

## 2018-06-03 NOTE — Telephone Encounter (Signed)
Spoke with pt. She is aware that all shipments must come to our office. [employer] x 3 times, line was busy. Will call back .

## 2018-06-05 NOTE — Telephone Encounter (Signed)
Spoke with [employer] rep. Rx Linzess 290 mcg will be arrive at the office in the next 7-10 business days. Order [id number], pt will be notified when it's received.

## 2018-06-11 NOTE — Telephone Encounter (Signed)
Pt called to see if the Linzess has arrived. Will call pt when medication arrives.

## 2018-06-14 ENCOUNTER — Emergency Department (HOSPITAL_COMMUNITY): Payer: Medicaid Other

## 2018-06-14 ENCOUNTER — Encounter (HOSPITAL_COMMUNITY): Payer: Self-pay | Admitting: Emergency Medicine

## 2018-06-14 ENCOUNTER — Other Ambulatory Visit: Payer: Self-pay

## 2018-06-14 ENCOUNTER — Emergency Department (HOSPITAL_COMMUNITY)
Admission: EM | Admit: 2018-06-14 | Discharge: 2018-06-14 | Disposition: A | Discharge status: Home / Self Care | Payer: Medicaid Other | Attending: Emergency Medicine | Admitting: Emergency Medicine

## 2018-06-14 DIAGNOSIS — F17210 Nicotine dependence, cigarettes, uncomplicated: Secondary | ICD-10-CM | POA: Insufficient documentation

## 2018-06-14 DIAGNOSIS — K5900 Constipation, unspecified: Secondary | ICD-10-CM | POA: Insufficient documentation

## 2018-06-14 DIAGNOSIS — R109 Unspecified abdominal pain: Secondary | ICD-10-CM | POA: Diagnosis present

## 2018-06-14 DIAGNOSIS — J45909 Unspecified asthma, uncomplicated: Secondary | ICD-10-CM | POA: Insufficient documentation

## 2018-06-14 DIAGNOSIS — I10 Essential (primary) hypertension: Secondary | ICD-10-CM | POA: Diagnosis not present

## 2018-06-14 DIAGNOSIS — Z7982 Long term (current) use of aspirin: Secondary | ICD-10-CM | POA: Diagnosis not present

## 2018-06-14 DIAGNOSIS — Z79899 Other long term (current) drug therapy: Secondary | ICD-10-CM | POA: Insufficient documentation

## 2018-06-14 LAB — BASIC METABOLIC PANEL
Anion gap: 10 (ref 5–15)
BUN: 17 mg/dL (ref 6–20)
CO2: 29 mmol/L (ref 22–32)
Calcium: 8.9 mg/dL (ref 8.9–10.3)
Chloride: 94 mmol/L — ABNORMAL LOW (ref 98–111)
Creatinine, Ser: 0.92 mg/dL (ref 0.44–1.00)
GFR calc Af Amer: >60 mL/min (ref >60)
GFR calc non Af Amer: >60 mL/min (ref >60)
Glucose, Bld: 147 mg/dL — ABNORMAL HIGH (ref 70–99)
Potassium: 3.9 mmol/L (ref 3.5–5.1)
Sodium: 133 mmol/L — ABNORMAL LOW (ref 135–145)

## 2018-06-14 MED ORDER — BISACODYL 5 MG PO TBEC
5.0000 mg | DELAYED_RELEASE_TABLET | Freq: Once | ORAL | Status: AC
  Administered 2018-06-14: 5 mg via ORAL
  Filled 2018-06-14: qty 1

## 2018-06-14 NOTE — ED Triage Notes (Signed)
Pt c/o of abdominal pain since yesterday. Pt states she took magnesium citrate today and her abd pain got worse. Pt states she hasnt had a bm for 3 weeks. Pt states she has no symptoms of covid 19 but was tested for it today at her doctors office.

## 2018-06-14 NOTE — Discharge Instructions (Addendum)
Your labs and xrays are stable today.  You do have a moderate amount of stool in your right colon but are not impacted so should be able to pass this without any treatments here.  You may take the dulcolax as prescribed to help over the next several days.  In addition, I recommend Milk of Magnesia (no prescription needed) which will help help to pass this stool.  You may need several more enemas over the weekend.  It appears the one you took today was helpful as there is minimal stool in your left colon.

## 2018-06-14 NOTE — ED Notes (Signed)
Radiology at bedside
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
## 2018-06-14 NOTE — ED Notes (Signed)
Pt ambulatory to waiting room. Pt verbalized understanding of discharge instructions.   

## 2018-06-17 ENCOUNTER — Telehealth: Payer: Self-pay | Admitting: Obstetrics & Gynecology

## 2018-06-17 NOTE — Telephone Encounter (Signed)
Patient called, would like to know if Dr. Elonda Husky wants her to stay on the antibiotic for UTI's.  If so, please call more in.  [place]  [phone]

## 2018-06-18 ENCOUNTER — Other Ambulatory Visit: Payer: Self-pay | Admitting: Obstetrics & Gynecology

## 2018-06-18 MED ORDER — CIPROFLOXACIN HCL 500 MG PO TABS: 500.0000 mg | ORAL_TABLET | Freq: Two times a day (BID) | ORAL | 0 refills | Status: DC

## 2018-06-18 MED ORDER — CEPHALEXIN 500 MG PO CAPS: ORAL_CAPSULE | ORAL | 5 refills | Status: DC

## 2018-06-18 NOTE — Telephone Encounter (Signed)
refilled 

## 2018-06-18 NOTE — Telephone Encounter (Signed)
How do we know she has a UTI

## 2018-06-18 NOTE — Telephone Encounter (Signed)
No I sent a script to [facility] so she could go ahead and get it, it should not be too expensive

## 2018-06-18 NOTE — Telephone Encounter (Addendum)
Pt called [facility] and Cipro was going to cost $85.05. Pt can't afford that. Please send prescription to [facility]. Pt states she's not having any symptoms so she's fine with waiting. Thanks!! [place]

## 2018-06-18 NOTE — Telephone Encounter (Signed)
Pts Linzess 290 mcg has arrived. Pt wants medication mailed to her. Pt was told that this can't be mailed per the pt assistance rules and office guidelines.

## 2018-06-18 NOTE — Telephone Encounter (Signed)
Pt states she has a UTI. I advised Keflex will treat a UTI. Does she need to be prescribed it more than at bedtime since she has a UTI? Please advise. Thanks!! [place]

## 2018-06-18 NOTE — Telephone Encounter (Signed)
Pt states she got tested at [facility] and she does have a UTI and wants to be sure the Keflex will also treat the UTI. Pt would like Dr. Elonda Husky to have a copy of the urinalysis that was done. She said it was done on 4/14 and got the results today. She says the number is [phone].

## 2018-06-19 NOTE — Telephone Encounter (Signed)
Pt states that [facility] does carry the Cipro. The phone number to them is [phone].

## 2018-06-19 NOTE — Telephone Encounter (Addendum)
Cipro 500 mg 1 BID x 7 days #14 called in to [facility]. Pt aware.  [place]

## 2018-06-24 ENCOUNTER — Telehealth: Payer: Self-pay | Admitting: *Deleted

## 2018-06-24 NOTE — Telephone Encounter (Signed)
Pat from [facility] told pt not to take Zofran while on Cipro. Pt voiced understanding. Wanted to let us know. [place]

## 2018-06-27 ENCOUNTER — Telehealth: Payer: Self-pay | Admitting: Obstetrics & Gynecology

## 2018-06-27 NOTE — Telephone Encounter (Signed)
Yes we need to do a culture

## 2018-06-27 NOTE — Telephone Encounter (Signed)
Spoke with patient she is still on antibiotics and her question is actually do we need to test the urine again after she finishes treatment. She had sepsis before and is worried this will happen again.

## 2018-06-27 NOTE — Telephone Encounter (Signed)
Pt recently took abx. Thinks she has a uti again. Does she need to come in for urinalysis or culture?

## 2018-06-27 NOTE — Telephone Encounter (Signed)
Patient called stating that she would like a call back from Dr. Elonda Husky. Pt states that Dr. Elonda Husky gave her an antibiotic for something else and her primary care Dr. Hanley Seamen her medication for a UTI. Pt thinks that she might have another UTI and would like to know if Dr. Elonda Husky needs her to come in a leave a urine sample to see if she has a uti. Please contact pt

## 2018-07-01 ENCOUNTER — Telehealth: Payer: Self-pay | Admitting: Internal Medicine

## 2018-07-01 NOTE — Telephone Encounter (Signed)
Tried calling pt back, no VM is available. Received pt's VM stating the the [facility] doctor wanted her to take 3 capfuls of Miralax daily to clean her out. Pt states on VM that she hasn't has a BM in 4 weeks. Will call pt back to discuss more.

## 2018-07-01 NOTE — Telephone Encounter (Signed)
Spoke with pt. She said that the only way she was able to have a bowel movement, was by putting a glove on and pulling it from the rectum. Pt understands that the PA recommended Miralax 3 cap fulls daily, along with your Linzess once daily and wasn't sure what she should do. ED [facility] and she can't afford it. I asked pt if she checked for an otc brand of Miralax. She was going to look into the price. Pt wants something done about this constipation before she becomes impacted.    Susan,Can records be requested from [facility].

## 2018-07-01 NOTE — Telephone Encounter (Signed)
Pt called to let us know that she went to [facility] ER over the weekend because she hasn't had a BM. She wanted to speak with the nurse. I told her AM was on another call and transferred to VM.

## 2018-07-02 NOTE — Telephone Encounter (Signed)
If she is still feeling significantly constipated, may do a Miralax "purge", taking 1 capful every hour with 8 ounces of water, up to 6 doses. Stop if she starts to have results prior to end of 6th dose. She likely needs to have additional Miralax or stool softener daily to BID in addition to [place]. Abdominal xray with moderate amount of stool in ascending colon on 4/17 at time of ED presentation. Miralax is over-the-counter, so she will likely need to get purchase it this way. If still too expensive, can see if a stool softener like generic colace is less expensive. May use generic OTC for Miralax, [facility] may be the best bet as generic available. Call if no improvement despite these measures.

## 2018-07-02 NOTE — Telephone Encounter (Signed)
Tried calling pt. Not able to leave a voice message, VM not available.

## 2018-07-03 NOTE — Telephone Encounter (Signed)
Spoke with pt. She started the Miralax purge and has had a bowel movement. Pt will call back if needed.

## 2018-07-04 ENCOUNTER — Telehealth: Payer: Self-pay | Admitting: Obstetrics & Gynecology

## 2018-07-04 NOTE — Telephone Encounter (Signed)
Pt wanting to see if she needs to come in for a urine test to see if the Cipro got rid of her UTI.

## 2018-07-14 ENCOUNTER — Encounter (HOSPITAL_COMMUNITY): Payer: Self-pay

## 2018-07-14 ENCOUNTER — Other Ambulatory Visit: Payer: Self-pay

## 2018-07-14 ENCOUNTER — Emergency Department (HOSPITAL_COMMUNITY)
Admission: EM | Admit: 2018-07-14 | Discharge: 2018-07-14 | Disposition: A | Discharge status: Home / Self Care | Payer: Medicaid Other | Attending: Emergency Medicine | Admitting: Emergency Medicine

## 2018-07-14 DIAGNOSIS — J45909 Unspecified asthma, uncomplicated: Secondary | ICD-10-CM | POA: Diagnosis not present

## 2018-07-14 DIAGNOSIS — Z79899 Other long term (current) drug therapy: Secondary | ICD-10-CM | POA: Insufficient documentation

## 2018-07-14 DIAGNOSIS — E785 Hyperlipidemia, unspecified: Secondary | ICD-10-CM | POA: Diagnosis not present

## 2018-07-14 DIAGNOSIS — R21 Rash and other nonspecific skin eruption: Secondary | ICD-10-CM

## 2018-07-14 DIAGNOSIS — I10 Essential (primary) hypertension: Secondary | ICD-10-CM | POA: Insufficient documentation

## 2018-07-14 DIAGNOSIS — R609 Edema, unspecified: Secondary | ICD-10-CM | POA: Diagnosis not present

## 2018-07-14 DIAGNOSIS — F17210 Nicotine dependence, cigarettes, uncomplicated: Secondary | ICD-10-CM | POA: Insufficient documentation

## 2018-07-14 DIAGNOSIS — M7989 Other specified soft tissue disorders: Secondary | ICD-10-CM

## 2018-07-14 LAB — CBC
HCT: 35.3 % — ABNORMAL LOW (ref 36.0–46.0)
Hemoglobin: 11.2 g/dL — ABNORMAL LOW (ref 12.0–15.0)
MCH: 28.9 pg (ref 26.0–34.0)
MCHC: 31.7 g/dL (ref 30.0–36.0)
MCV: 91 fL (ref 80.0–100.0)
Platelets: 185 10*3/uL (ref 150–400)
RBC: 3.88 MIL/uL (ref 3.87–5.11)
RDW: 14.4 % (ref 11.5–15.5)
WBC: 8.1 10*3/uL (ref 4.0–10.5)
nRBC: 0 % (ref 0.0–0.2)

## 2018-07-14 LAB — BASIC METABOLIC PANEL
Anion gap: 9 (ref 5–15)
BUN: 9 mg/dL (ref 6–20)
CO2: 24 mmol/L (ref 22–32)
Calcium: 8.9 mg/dL (ref 8.9–10.3)
Chloride: 105 mmol/L (ref 98–111)
Creatinine, Ser: 0.83 mg/dL (ref 0.44–1.00)
GFR calc Af Amer: >60 mL/min (ref >60)
GFR calc non Af Amer: >60 mL/min (ref >60)
Glucose, Bld: 120 mg/dL — ABNORMAL HIGH (ref 70–99)
Potassium: 3.7 mmol/L (ref 3.5–5.1)
Sodium: 138 mmol/L (ref 135–145)

## 2018-07-14 MED ORDER — KETOROLAC TROMETHAMINE 15 MG/ML IJ SOLN
15.0000 mg | Freq: Once | INTRAMUSCULAR | Status: AC
  Administered 2018-07-14: 15 mg via INTRAVENOUS
  Filled 2018-07-14: qty 1

## 2018-07-14 MED ORDER — KETOROLAC TROMETHAMINE 60 MG/2ML IM SOLN: 30.0000 mg | Freq: Once | INTRAMUSCULAR | Status: DC

## 2018-07-14 NOTE — ED Triage Notes (Signed)
Pt reports bilateral foot and ankle swelling.Noted to have red spots on bilateral legs due to being on amoxicillin which was switched to Cipro by Dr Luan Pulling. Pt wants to know why spots still are on legs. Also says she has thrush and infection in mouth and was placed on doxy by Dr Darol Destine. Questions if the swelling is coming from doxycycline or tessalon pearles she has been on since 07/09/18   Is also on diflucan for thrush
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
## 2018-07-14 NOTE — Discharge Instructions (Addendum)
Continue your current medications, consider seeing a dermatologist for further evaluation

## 2018-07-16 ENCOUNTER — Other Ambulatory Visit: Payer: Self-pay

## 2018-07-16 ENCOUNTER — Ambulatory Visit (HOSPITAL_COMMUNITY)
Admission: RE | Admit: 2018-07-16 | Discharge: 2018-07-16 | Disposition: A | Discharge status: Home / Self Care | Payer: Medicaid Other | Source: Ambulatory Visit | Attending: Emergency Medicine | Admitting: Emergency Medicine

## 2018-07-16 DIAGNOSIS — R600 Localized edema: Secondary | ICD-10-CM | POA: Diagnosis present

## 2018-07-16 NOTE — ED Provider Notes (Signed)
Patient had ultrasound performed to look for DVT, discussed with technician and reviewed results negative for DVT.  Patient will continue to follow-up outpatient.  Golda Acre, MD 07/16/18 1106

## 2018-07-23 ENCOUNTER — Telehealth: Payer: Self-pay | Admitting: Cardiology

## 2018-07-23 NOTE — Telephone Encounter (Signed)
Right foot and ankle swelling. Reported that it started in left foot and was associated with pain that felt like gout. No longer has pain or swelling in left foot. Recently given fluid pill (lasix 40 mg) by PCP and started it on 07/18/2018. Has been elevating feet and says the swelling has improved. Patient received a call from her PCP while medications were being reviewed and the call was disconnected. Awaiting call back from patient

## 2018-07-23 NOTE — Telephone Encounter (Signed)
Patient would like to speak with nurse regarding feet swelling no SOB. / tg

## 2018-07-25 ENCOUNTER — Telehealth: Payer: Self-pay | Admitting: Internal Medicine

## 2018-07-25 NOTE — Telephone Encounter (Signed)
Pt notified that it is ok to pick her pt assistance medication the day of her appointment.

## 2018-07-25 NOTE — Telephone Encounter (Signed)
Pt is coming on 6/4 for a banding with AB. She asked if she could pick up Linzess when she gets here that day.

## 2018-07-25 NOTE — Telephone Encounter (Signed)
Patient says that she has discussed issues with her family doctor and no longer have any concerns.

## 2018-07-27 ENCOUNTER — Other Ambulatory Visit: Payer: Self-pay | Admitting: Obstetrics & Gynecology

## 2018-07-29 ENCOUNTER — Other Ambulatory Visit: Payer: Self-pay | Admitting: *Deleted

## 2018-07-30 ENCOUNTER — Telehealth: Payer: Self-pay | Admitting: Obstetrics & Gynecology

## 2018-07-30 MED ORDER — ESTRADIOL 2 MG PO TABS: 2.0000 mg | ORAL_TABLET | Freq: Every day | ORAL | 11 refills | Status: DC

## 2018-07-30 MED ORDER — MEDROXYPROGESTERONE ACETATE 2.5 MG PO TABS: 2.5000 mg | ORAL_TABLET | Freq: Every day | ORAL | 11 refills | Status: AC

## 2018-07-30 MED ORDER — ESTRADIOL 2 MG PO TABS: 2.0000 mg | ORAL_TABLET | Freq: Every day | ORAL | 11 refills | Status: AC

## 2018-07-30 NOTE — Telephone Encounter (Signed)
Pt needs Provera sent to [facility] and Estradiol to [facility].

## 2018-07-30 NOTE — Telephone Encounter (Signed)
I have refilled the prescriptions but they were not called, sent electronically to be clear

## 2018-07-30 NOTE — Telephone Encounter (Signed)
I believe I already did that, I think Im getting double messages

## 2018-07-30 NOTE — Telephone Encounter (Signed)
Patient called, stated that she needs Provera 2.5mg  called to [facility] an Estradiol to [facility] bc [facility] does not have it.  [phone]

## 2018-07-30 NOTE — Telephone Encounter (Signed)
Please call pt in ref to her meds that were called in.

## 2018-07-30 NOTE — Telephone Encounter (Signed)
Pharmacy needing clarification on the medroxyPROGESTERone (PROVERA) 2.5 MG tablet. They are asking will the pt take this everyday for good, and if so the dx.

## 2018-07-31 ENCOUNTER — Other Ambulatory Visit: Payer: Self-pay | Admitting: Obstetrics & Gynecology

## 2018-07-31 NOTE — Telephone Encounter (Signed)
Yes everyday

## 2018-07-31 NOTE — Telephone Encounter (Signed)
[facility] wanting to make sure pt is supposed to take Provera everyday. I advised that's how it's listed but wanted to double check. Due to endometrial thickening? Please advise. Thanks!! [place]

## 2018-08-01 ENCOUNTER — Other Ambulatory Visit: Payer: Self-pay

## 2018-08-01 ENCOUNTER — Encounter: Payer: Self-pay | Admitting: Gastroenterology

## 2018-08-01 ENCOUNTER — Ambulatory Visit (INDEPENDENT_AMBULATORY_CARE_PROVIDER_SITE_OTHER): Payer: Medicaid Other | Admitting: Gastroenterology

## 2018-08-01 VITALS — BP 124/79 | HR 87 | Temp 97.9°F | Ht 62.0 in | Wt 155.4 lb

## 2018-08-01 DIAGNOSIS — K642 Third degree hemorrhoids: Secondary | ICD-10-CM | POA: Insufficient documentation

## 2018-08-01 NOTE — Patient Instructions (Signed)
Make sure to take Linzess every day. Take Miralax 1 capful every day to every other day. Avoid straining.  Limit toilet time to no more than 2-3 minutes.   You may need one additional banding at your next visit.   We will see you in 3 months!  I enjoyed seeing you again today! As you know, I value our relationship and want to provide genuine, compassionate, and quality care. I welcome your feedback. If you receive a survey regarding your visit,  I greatly appreciate you taking time to fill this out. See you next time!  [name], PhD, ANP-BC [facility]

## 2018-08-01 NOTE — Telephone Encounter (Signed)
Spoke with Seth Bake at [place] letting her know pt is to take Provera everyday for endometrial thickening. [place]

## 2018-08-01 NOTE — Progress Notes (Signed)
[place] Banding Note:    History of symptomatic Grade 2-3 hemorrhoids, previously banding the right anterior and right posterior. Returns today stating in interim from last appointment, had bout with constipation lasting up to 4 weeks. Finally had relief with multiple doses of Miralax. Linzess 290 mcg daily and Miralax prn. Prolonged toilet time.    The patient presents with symptomatic grade 2-3 hemorrhoids, unresponsive to maximal medical therapy, requesting rubber band ligation of her hemorrhoidal disease. All risks, benefits, and alternative forms of therapy were described and informed consent was obtained.  The decision was made to band the left lateral internal hemorrhoid, and the [place] was used to perform band ligation without complication. Digital anorectal examination was then performed to assure proper positioning of the band, and no adjustment was required. The patient was discharged home without pain or other issues. Dietary and behavioral recommendations were given. She is to continue Linzess 290 mcg daily and add Miralax daily as needed. No complications were encountered and the patient tolerated the procedure well.  Return in 3 months for routine office visit for follow-up of constipation.   [name], PhD, ANP-BC [facility]

## 2018-08-06 NOTE — Telephone Encounter (Signed)
Pt was seen last week and asked for her Linzess 290 mcg. I called [facility] and they didn't ship another shipment off due to the last shipment being sent on 06/11/18. I asked pt if she ever picked up her Linzess in April after we notified her that it was here in the office and pt said she picked it up. [facility] posted to add Linzess or on 08/11/18 and it will take 10 business days to arrive after that. Pt is aware and has 20 pills left of Linzess 290 mcg.

## 2018-08-20 ENCOUNTER — Telehealth: Payer: Self-pay | Admitting: Family

## 2018-09-04 ENCOUNTER — Encounter: Payer: Self-pay | Admitting: Cardiology

## 2018-09-05 ENCOUNTER — Telehealth: Payer: Self-pay | Admitting: Cardiology

## 2018-09-05 NOTE — Telephone Encounter (Signed)
Patient called asking if Dr Domenic Polite has reviewed what [facility] sent over. Said chest Xray, notes and Echo (in care Everywhere)  She said that she doesn't have a lot of minutes on her phone nor does she have VM. To please keep calling her if she does not answer until she answers

## 2018-09-05 NOTE — Telephone Encounter (Signed)
I am not actually sure to what this refers.  I saw Ms. Whan back in September of last year, not since that time.  She had a low risk stress test in November 2019 and an echocardiogram that showed normal LVEF with mild aortic stenosis which would be unlikely to be causing symptoms.  The ECG sent over is normal and the chest x-ray reports no acute process.  Beyond that, I cannot comment any further based on the information provided.

## 2018-09-05 NOTE — Telephone Encounter (Signed)
Recent visit to ED at [facility] for edema of legs. Patient says that the ED physician told her that he thought she may have heart failure. Lab work was ordered but cancelled per patient request.  Patient given response by Dr. Domenic Polite and verbalized understanding.

## 2018-09-09 ENCOUNTER — Telehealth: Payer: Self-pay | Admitting: Obstetrics & Gynecology

## 2018-09-09 ENCOUNTER — Other Ambulatory Visit: Payer: Self-pay | Admitting: Obstetrics & Gynecology

## 2018-09-09 NOTE — Telephone Encounter (Signed)
Pt states would like her mammogram to be scheduled in October. Also would like her diflucan called in for the rest of the year to [facility].

## 2018-09-10 ENCOUNTER — Telehealth: Payer: Self-pay | Admitting: Internal Medicine

## 2018-09-10 NOTE — Telephone Encounter (Signed)
Called [facility] pt assistance to schedule delivery for Linzess 290 mcg. Per our conversation in June, 2020, [place] said a shipment would be mailed to our office in June. We haven't received a message and the rep said her shipment was in [place], [place]. Faith the [facility] Rep placed another Linzess order. Order number is #[id number]. Pt is aware that her order was placed. Pt states that she doesn't have transportation and she will have to come and get the medication at her next apt.

## 2018-09-10 NOTE — Telephone Encounter (Signed)
Rx sent to pharmacy by Dr. Elonda Husky. Patient can call to set up mammogram at her convenience.

## 2018-09-10 NOTE — Telephone Encounter (Signed)
Patient called and said she was almost out of linzess and needed it sent in to the program where she receives it

## 2018-09-11 ENCOUNTER — Ambulatory Visit: Payer: Medicaid Other | Admitting: Student

## 2018-09-13 ENCOUNTER — Encounter: Payer: Self-pay | Admitting: *Deleted

## 2018-09-18 ENCOUNTER — Telehealth: Payer: Self-pay | Admitting: Internal Medicine

## 2018-09-18 NOTE — Telephone Encounter (Signed)
Please send her omeprazole to [facility] [phone]

## 2018-09-18 NOTE — Telephone Encounter (Signed)
Lmom, waiting on a return call. Spoke with an [facility] rep, pt's medication will arrive tomorrow 09/19/2018.

## 2018-09-19 NOTE — Telephone Encounter (Signed)
Lmom, waiting on a return call.  

## 2018-10-01 ENCOUNTER — Encounter (HOSPITAL_COMMUNITY): Payer: Self-pay | Admitting: *Deleted

## 2018-10-01 ENCOUNTER — Other Ambulatory Visit: Payer: Self-pay

## 2018-10-01 ENCOUNTER — Emergency Department (HOSPITAL_COMMUNITY)
Admission: EM | Admit: 2018-10-01 | Discharge: 2018-10-02 | Disposition: A | Discharge status: Other Acute Inpatient Hospital | Payer: Medicaid Other | Attending: Emergency Medicine | Admitting: Emergency Medicine

## 2018-10-01 DIAGNOSIS — I10 Essential (primary) hypertension: Secondary | ICD-10-CM | POA: Insufficient documentation

## 2018-10-01 DIAGNOSIS — F22 Delusional disorders: Secondary | ICD-10-CM | POA: Diagnosis not present

## 2018-10-01 DIAGNOSIS — Z79899 Other long term (current) drug therapy: Secondary | ICD-10-CM | POA: Diagnosis not present

## 2018-10-01 DIAGNOSIS — F17210 Nicotine dependence, cigarettes, uncomplicated: Secondary | ICD-10-CM | POA: Insufficient documentation

## 2018-10-01 DIAGNOSIS — Z7982 Long term (current) use of aspirin: Secondary | ICD-10-CM | POA: Insufficient documentation

## 2018-10-01 DIAGNOSIS — Z046 Encounter for general psychiatric examination, requested by authority: Secondary | ICD-10-CM | POA: Diagnosis present

## 2018-10-01 DIAGNOSIS — J45909 Unspecified asthma, uncomplicated: Secondary | ICD-10-CM | POA: Insufficient documentation

## 2018-10-01 DIAGNOSIS — R45851 Suicidal ideations: Secondary | ICD-10-CM

## 2018-10-01 LAB — CBC WITH DIFFERENTIAL/PLATELET
Abs Immature Granulocytes: 0.01 10*3/uL (ref 0.00–0.07)
Basophils Absolute: 0 10*3/uL (ref 0.0–0.1)
Basophils Relative: 1 %
Eosinophils Absolute: 0.1 10*3/uL (ref 0.0–0.5)
Eosinophils Relative: 1 %
HCT: 37.5 % (ref 36.0–46.0)
Hemoglobin: 12 g/dL (ref 12.0–15.0)
Immature Granulocytes: 0 %
Lymphocytes Relative: 40 %
Lymphs Abs: 2 10*3/uL (ref 0.7–4.0)
MCH: 27.3 pg (ref 26.0–34.0)
MCHC: 32 g/dL (ref 30.0–36.0)
MCV: 85.4 fL (ref 80.0–100.0)
Monocytes Absolute: 0.3 10*3/uL (ref 0.1–1.0)
Monocytes Relative: 5 %
Neutro Abs: 2.6 10*3/uL (ref 1.7–7.7)
Neutrophils Relative %: 53 %
Platelets: 211 10*3/uL (ref 150–400)
RBC: 4.39 MIL/uL (ref 3.87–5.11)
RDW: 12.4 % (ref 11.5–15.5)
WBC: 4.9 10*3/uL (ref 4.0–10.5)
nRBC: 0 % (ref 0.0–0.2)

## 2018-10-01 LAB — BASIC METABOLIC PANEL
Anion gap: 5 (ref 5–15)
BUN: 14 mg/dL (ref 6–20)
CO2: 28 mmol/L (ref 22–32)
Calcium: 9.4 mg/dL (ref 8.9–10.3)
Chloride: 104 mmol/L (ref 98–111)
Creatinine, Ser: 0.7 mg/dL (ref 0.44–1.00)
GFR calc Af Amer: >60 mL/min (ref >60)
GFR calc non Af Amer: >60 mL/min (ref >60)
Glucose, Bld: 94 mg/dL (ref 70–99)
Potassium: 4.5 mmol/L (ref 3.5–5.1)
Sodium: 137 mmol/L (ref 135–145)

## 2018-10-01 LAB — ETHANOL: Alcohol, Ethyl (B): <10 mg/dL (ref <10)

## 2018-10-01 MED ORDER — ACETAMINOPHEN 325 MG PO TABS
650.0000 mg | ORAL_TABLET | Freq: Once | ORAL | Status: AC
  Administered 2018-10-01: 20:00:00 650 mg via ORAL
  Filled 2018-10-01: qty 2

## 2018-10-01 NOTE — ED Triage Notes (Signed)
Pt sent here for evaluation, hearing voices last night and night before. Pt unable to take medication as instructed, per pt pt did not receive the medication.  Pt denies SI or HI. Pt denies drugs or ETOH.
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
## 2018-10-01 NOTE — ED Notes (Signed)
Patient brought medication she feels is causing side effects, Latuda 20mg  taken one every day for the last 4 days, dose this morning.

## 2018-10-01 NOTE — Progress Notes (Signed)
Pt meets inpatient criteria per Mordecai Maes, NP. Referral information has been sent to the following hospitals for review:  [facility]  [facility]  [facility]      Disposition will continue to assist with inpatient placement needs.   Audree Camel, LCSW, [place] Disposition [facility] [phone]
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
## 2018-10-01 NOTE — ED Notes (Signed)
Pt wanded by security. 

## 2018-10-02 ENCOUNTER — Inpatient Hospital Stay (HOSPITAL_COMMUNITY)
Admission: AD | Admit: 2018-10-02 | Discharge: 2018-10-07 | DRG: 885 | Disposition: A | Discharge status: Home / Self Care | Payer: Medicaid Other | Source: Intra-hospital | Attending: Psychiatry | Admitting: Psychiatry

## 2018-10-02 ENCOUNTER — Other Ambulatory Visit: Payer: Self-pay | Admitting: Behavioral Health

## 2018-10-02 DIAGNOSIS — K219 Gastro-esophageal reflux disease without esophagitis: Secondary | ICD-10-CM | POA: Diagnosis present

## 2018-10-02 DIAGNOSIS — Z20828 Contact with and (suspected) exposure to other viral communicable diseases: Secondary | ICD-10-CM | POA: Diagnosis present

## 2018-10-02 DIAGNOSIS — Z91410 Personal history of adult physical and sexual abuse: Secondary | ICD-10-CM | POA: Diagnosis not present

## 2018-10-02 DIAGNOSIS — Z7982 Long term (current) use of aspirin: Secondary | ICD-10-CM

## 2018-10-02 DIAGNOSIS — Z833 Family history of diabetes mellitus: Secondary | ICD-10-CM | POA: Diagnosis not present

## 2018-10-02 DIAGNOSIS — Z791 Long term (current) use of non-steroidal anti-inflammatories (NSAID): Secondary | ICD-10-CM

## 2018-10-02 DIAGNOSIS — I10 Essential (primary) hypertension: Secondary | ICD-10-CM | POA: Diagnosis present

## 2018-10-02 DIAGNOSIS — F22 Delusional disorders: Secondary | ICD-10-CM | POA: Diagnosis not present

## 2018-10-02 DIAGNOSIS — F17210 Nicotine dependence, cigarettes, uncomplicated: Secondary | ICD-10-CM | POA: Diagnosis present

## 2018-10-02 DIAGNOSIS — Z79890 Hormone replacement therapy (postmenopausal): Secondary | ICD-10-CM

## 2018-10-02 DIAGNOSIS — Z880 Allergy status to penicillin: Secondary | ICD-10-CM | POA: Diagnosis not present

## 2018-10-02 DIAGNOSIS — Z8249 Family history of ischemic heart disease and other diseases of the circulatory system: Secondary | ICD-10-CM | POA: Diagnosis not present

## 2018-10-02 DIAGNOSIS — Z87820 Personal history of traumatic brain injury: Secondary | ICD-10-CM | POA: Diagnosis not present

## 2018-10-02 DIAGNOSIS — F333 Major depressive disorder, recurrent, severe with psychotic symptoms: Principal | ICD-10-CM | POA: Diagnosis present

## 2018-10-02 DIAGNOSIS — F1921 Other psychoactive substance dependence, in remission: Secondary | ICD-10-CM | POA: Diagnosis present

## 2018-10-02 DIAGNOSIS — Z818 Family history of other mental and behavioral disorders: Secondary | ICD-10-CM | POA: Diagnosis not present

## 2018-10-02 LAB — RAPID URINE DRUG SCREEN, HOSP PERFORMED
Amphetamines: POSITIVE — AB
Barbiturates: NOT DETECTED
Benzodiazepines: POSITIVE — AB
Cocaine: NOT DETECTED
Opiates: NOT DETECTED
Tetrahydrocannabinol: NOT DETECTED

## 2018-10-02 LAB — SARS CORONAVIRUS 2 BY RT PCR (HOSPITAL ORDER, PERFORMED IN ~~LOC~~ HOSPITAL LAB): SARS Coronavirus 2: NEGATIVE

## 2018-10-02 MED ORDER — HYDROXYZINE HCL 50 MG PO TABS: 50.0000 mg | ORAL_TABLET | Freq: Three times a day (TID) | ORAL | Status: DC | PRN

## 2018-10-02 MED ORDER — ESCITALOPRAM OXALATE 10 MG PO TABS
5.0000 mg | ORAL_TABLET | Freq: Every day | ORAL | Status: DC
  Administered 2018-10-02: 13:00:00 5 mg via ORAL
  Filled 2018-10-02: qty 1

## 2018-10-02 MED ORDER — ALUM & MAG HYDROXIDE-SIMETH 200-200-20 MG/5ML PO SUSP: 30.0000 mL | ORAL | Status: DC | PRN

## 2018-10-02 MED ORDER — ACETAMINOPHEN 325 MG PO TABS
650.0000 mg | ORAL_TABLET | Freq: Four times a day (QID) | ORAL | Status: DC | PRN
  Administered 2018-10-06: 650 mg via ORAL
  Filled 2018-10-02: qty 2

## 2018-10-02 MED ORDER — PANTOPRAZOLE SODIUM 40 MG PO TBEC
40.0000 mg | DELAYED_RELEASE_TABLET | Freq: Every day | ORAL | Status: DC
  Administered 2018-10-02: 13:00:00 40 mg via ORAL
  Filled 2018-10-02: qty 1

## 2018-10-02 MED ORDER — MAGNESIUM HYDROXIDE 400 MG/5ML PO SUSP: 30.0000 mL | Freq: Every day | ORAL | Status: DC | PRN

## 2018-10-02 MED ORDER — AMLODIPINE BESYLATE 5 MG PO TABS
5.0000 mg | ORAL_TABLET | Freq: Every morning | ORAL | Status: DC
  Administered 2018-10-02: 13:00:00 5 mg via ORAL
  Filled 2018-10-02: qty 1

## 2018-10-02 MED ORDER — FUROSEMIDE 40 MG PO TABS: 40.0000 mg | ORAL_TABLET | Freq: Every day | ORAL | Status: DC | PRN

## 2018-10-02 MED ORDER — LINACLOTIDE 145 MCG PO CAPS
290.0000 ug | ORAL_CAPSULE | Freq: Every day | ORAL | Status: DC
  Filled 2018-10-02 (×2): qty 1

## 2018-10-02 MED ORDER — ESTRADIOL 1 MG PO TABS
2.0000 mg | ORAL_TABLET | Freq: Every day | ORAL | Status: DC
  Administered 2018-10-02: 15:00:00 2 mg via ORAL
  Filled 2018-10-02 (×3): qty 2

## 2018-10-02 MED ORDER — ASPIRIN EC 81 MG PO TBEC
81.0000 mg | DELAYED_RELEASE_TABLET | Freq: Every day | ORAL | Status: DC
  Administered 2018-10-02: 13:00:00 81 mg via ORAL
  Filled 2018-10-02: qty 1

## 2018-10-02 MED ORDER — ATORVASTATIN CALCIUM 10 MG PO TABS
20.0000 mg | ORAL_TABLET | Freq: Every day | ORAL | Status: DC
  Administered 2018-10-02: 13:00:00 20 mg via ORAL
  Filled 2018-10-02: qty 2

## 2018-10-02 MED ORDER — MEDROXYPROGESTERONE ACETATE 5 MG PO TABS
2.5000 mg | ORAL_TABLET | Freq: Every day | ORAL | Status: DC
  Administered 2018-10-02: 2.5 mg via ORAL
  Filled 2018-10-02 (×3): qty 1

## 2018-10-02 MED ORDER — FESOTERODINE FUMARATE ER 4 MG PO TB24
8.0000 mg | ORAL_TABLET | Freq: Every day | ORAL | Status: DC
  Administered 2018-10-02: 15:00:00 8 mg via ORAL
  Filled 2018-10-02 (×3): qty 2

## 2018-10-02 MED ORDER — ALBUTEROL SULFATE HFA 108 (90 BASE) MCG/ACT IN AERS: 2.0000 | INHALATION_SPRAY | Freq: Four times a day (QID) | RESPIRATORY_TRACT | Status: DC | PRN

## 2018-10-02 NOTE — Progress Notes (Signed)
Pt accepted to  [facility]. Bed 503-1 Mordecai Maes, NP is the accepting provider.  Johnn Hai, MD is the attending provider.  Call report to [phone]  [email or web address] notified.   Pt is  Voluntary.  Pt may be transported by [facility]  Pt scheduled  to arrive at [facility] as soon as transport can be arranged.  Areatha Keas. Judi Cong, MSW, [facility] Clinical Social Work [phone] (cell) [phone] (office)

## 2018-10-02 NOTE — Progress Notes (Signed)
[name] is a 58 y.o. female Voluntary admitted for AVH and increased depression. Pt has been irritable, agitated and labile during admission. Admission could process could not be finished due to pt being uncooperative. Pt refused to sign the papers stating that she is being tricked. The staff  were able to perform skin search and obtained the V/S. Pt walked to the unit and went straight to sleep. Will continue to monitor.

## 2018-10-02 NOTE — Telephone Encounter (Signed)
Received Linzess from [employer]. LMOM, waiting on a return call to inform pt that her medication is ready for pickup.

## 2018-10-02 NOTE — [facility] Counselor (Signed)
Reassessment- Pt denies SI/HI. Pt reports AVH. Pt states she sees things and "people" repeat what she saying. Pt states she sees and hears things at night. Per Pt it scares her.   Pt appears anxious and paranoid.  Lorenza Cambridge, [facility] Triage Specialist

## 2018-10-02 NOTE — Progress Notes (Addendum)
   Medication assessment   In brief, [name] is an 58 y.o. female patient report she came to the hospital because she's hearing things and seeing shadows. The voices are anatgonizing  her. Report she's afraid for her life due to the voices. Report her doctor gave her a 30-day trail for [place] which she believes has assumed her to experience hallucinations.  As per report from patients daughter, [name]- Daughter report she feels it's all the medication that her mother is taking. I personally spoke with [name] and she stated that prior to her mother starting the [place], she had never endorsed any hallucinations. She does report for years that her mother has been paranoid and this is not a new concerns. She states, " for many years she has thoughts that someone was hacking her phone. She called me once about 2"00 in the morning and said the police was there although no one was there. She called me another time and said a woman was standing outside of her window walkthrough no one was there." She reports she knows that her mother is very depressed and she cannot understand why her outpatient [name] her on [place] and not an antidepressant. Reports the Latuda was prescribed by [name] and I did attempt to reach them walkthrough they were unable to be reached. She reports her mother has recently experienced several losses one of which was a frined who passed away 1 month ago and another friend who passed away 6 months ago. She reports she feels ok with not restarting the [place] and is wanting an antidepressant to be started. She does report that he rmother has a history of depression and was taking antidepressant medications in the past although she does not remember what it was.   I am recommending starting Lexapro 5 mg po daily to be titrated as appropriate.She is aware that this medication may take at least 4-6 weeks to obtain a therapeutic response.     This recommendation has been  provided to [name]. I have put in the order of Lexapro to begin today.

## 2018-10-03 LAB — LIPID PANEL
Cholesterol: 148 mg/dL (ref 0–200)
HDL: 42 mg/dL (ref >40)
LDL Cholesterol: 90 mg/dL (ref 0–99)
Total CHOL/HDL Ratio: 3.5 RATIO
Triglycerides: 81 mg/dL (ref <150)
VLDL: 16 mg/dL (ref 0–40)

## 2018-10-03 LAB — HEMOGLOBIN A1C
Hgb A1c MFr Bld: 5.3 % (ref 4.8–5.6)
Mean Plasma Glucose: 105.41 mg/dL

## 2018-10-03 LAB — TSH: TSH: 0.666 u[IU]/mL (ref 0.350–4.500)

## 2018-10-03 MED ORDER — ALPRAZOLAM 0.5 MG PO TABS
1.0000 mg | ORAL_TABLET | Freq: Three times a day (TID) | ORAL | Status: DC
  Administered 2018-10-03 – 2018-10-04 (×3): 1 mg via ORAL
  Filled 2018-10-03 (×3): qty 2

## 2018-10-03 MED ORDER — ESTRADIOL 1 MG PO TABS
2.0000 mg | ORAL_TABLET | Freq: Every day | ORAL | Status: DC
  Administered 2018-10-03 – 2018-10-07 (×5): 2 mg via ORAL
  Filled 2018-10-03: qty 2
  Filled 2018-10-03 (×2): qty 1
  Filled 2018-10-03: qty 2
  Filled 2018-10-03: qty 1
  Filled 2018-10-03 (×2): qty 2
  Filled 2018-10-03: qty 1

## 2018-10-03 MED ORDER — FLUOXETINE HCL 20 MG PO CAPS
20.0000 mg | ORAL_CAPSULE | Freq: Every day | ORAL | Status: DC
  Administered 2018-10-06 – 2018-10-07 (×2): 20 mg via ORAL
  Filled 2018-10-03: qty 1
  Filled 2018-10-03: qty 7
  Filled 2018-10-03 (×5): qty 1

## 2018-10-03 MED ORDER — CLONIDINE HCL 0.1 MG PO TABS: 0.2000 mg | ORAL_TABLET | ORAL | Status: DC | PRN

## 2018-10-03 MED ORDER — ALBUTEROL SULFATE HFA 108 (90 BASE) MCG/ACT IN AERS: 1.0000 | INHALATION_SPRAY | RESPIRATORY_TRACT | Status: DC | PRN

## 2018-10-03 MED ORDER — TEMAZEPAM 30 MG PO CAPS
30.0000 mg | ORAL_CAPSULE | Freq: Every day | ORAL | Status: DC
  Administered 2018-10-03 – 2018-10-06 (×3): 30 mg via ORAL
  Filled 2018-10-03 (×3): qty 1

## 2018-10-03 MED ORDER — PANTOPRAZOLE SODIUM 40 MG PO TBEC
40.0000 mg | DELAYED_RELEASE_TABLET | Freq: Every day | ORAL | Status: DC
  Administered 2018-10-03 – 2018-10-07 (×5): 40 mg via ORAL
  Filled 2018-10-03 (×8): qty 1

## 2018-10-03 MED ORDER — MEDROXYPROGESTERONE ACETATE 2.5 MG PO TABS
2.5000 mg | ORAL_TABLET | Freq: Every day | ORAL | Status: DC
  Administered 2018-10-04 – 2018-10-06 (×3): 2.5 mg via ORAL
  Filled 2018-10-03 (×7): qty 1

## 2018-10-03 MED ORDER — ASPIRIN 81 MG PO CHEW
81.0000 mg | CHEWABLE_TABLET | Freq: Every day | ORAL | Status: DC
  Administered 2018-10-03 – 2018-10-07 (×5): 81 mg via ORAL
  Filled 2018-10-03 (×7): qty 1

## 2018-10-03 MED ORDER — PERPHENAZINE 2 MG PO TABS
2.0000 mg | ORAL_TABLET | Freq: Three times a day (TID) | ORAL | Status: DC
  Administered 2018-10-03 – 2018-10-07 (×12): 2 mg via ORAL
  Filled 2018-10-03 (×18): qty 1
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
## 2018-10-03 NOTE — Progress Notes (Signed)
Patient has been isolative to her room and did not engage Probation officer in conversation.  Patient refused prozac stating, I have tried that before and it made me absolutely crazy.  Patient took all other prescribed medications.  Patient then went into her room stating the hall was too stimulating for her.   Assess patient for safety, offer medications as prescribed, engage patient in 1:1 staff talks.   Patient able to contract for safety.  Continue to monitor as planned.

## 2018-10-03 NOTE — [facility] Group Notes (Signed)
[place] LCSW Group Therapy Note  Date/Time: 10/03/2018 @ 1:00pm  Type of Therapy/Topic:  Group Therapy:  Feelings about Diagnosis  Participation Level:  Did Not Attend   Mood: Did not attend    Description of Group:    This group will allow patients to explore their thoughts and feelings about diagnoses they have received. Patients will be guided to explore their level of understanding and acceptance of these diagnoses. Facilitator will encourage patients to process their thoughts and feelings about the reactions of others to their diagnosis, and will guide patients in identifying ways to discuss their diagnosis with significant others in their lives. This group will be process-oriented, with patients participating in exploration of their own experiences as well as giving and receiving support and challenge from other group members.   Therapeutic Goals: 1. Patient will demonstrate understanding of diagnosis as evidence by identifying two or more symptoms of the disorder:  2. Patient will be able to express two feelings regarding the diagnosis 3. Patient will demonstrate ability to communicate their needs through discussion and/or role plays  Summary of Patient Progress:   Patient did not attend group.      Therapeutic Modalities:   Cognitive Behavioral Therapy Brief Therapy Feelings Identification   Ardelle Anton, LCSW

## 2018-10-03 NOTE — B[facility] Counselor (Signed)
CSW attempted to met with patient to complete her PSA assessment. Patient was asleep and would not awake for the assessment for the CSW. This is the 1st attempt.  

## 2018-10-03 NOTE — Progress Notes (Signed)
Patient ID: [name], female   DOB: [birth year], 58 y.o.   MRN: [id number] D: Patient has been sleeping in her room since beginning on shift. Pt reports she is doing well. Pt presented with depressed mood and flat affect. Pt denies  SI/HI/AVH and pain.No behavioral issues noted.  A: Support and encouragement offered as needed to express needs. Medications administered as prescribed. snack offered but refused. R: Patient is safe and cooperative on unit. Will continue to monitor  for safety and stability.

## 2018-10-03 NOTE — [facility] Suicide Risk Assessment (Signed)
[facility] Admission Suicide Risk Assessment   Total Time spent with patient: 45 minutes Principal Problem: psychosis Diagnosis:  Active Problems:   MDD (major depressive disorder), recurrent, severe, with psychosis ([place])  Subjective Data:new onset psychosis  Continued Clinical Symptoms:    The "Alcohol Use Disorders Identification Test", Guidelines for Use in Primary Care, Second Edition.  World Pharmacologist [facility]). Score between 0-7:  no or low risk or alcohol related problems. Score between 8-15:  moderate risk of alcohol related problems. Score between 16-19:  high risk of alcohol related problems. Score 20 or above:  warrants further diagnostic evaluation for alcohol dependence and treatment.   CLINICAL FACTORS:   Severe Anxiety and/or Agitation   Musculoskeletal: Strength & Muscle Tone: within normal limits Gait & Station: normal Patient leans: N/A  Psychiatric Specialty Exam: Physical Exam  Nursing note and vitals reviewed. Constitutional: She appears well-developed and well-nourished.   -   Review of Systems  Constitutional: Negative.   Eyes: Negative.   Cardiovascular: Negative.   Gastrointestinal: Negative.   Musculoskeletal: Negative.   Endo/Heme/Allergies: Negative.     Blood pressure 119/73, pulse (!) 107, temperature 99.1 F (37.3 C), temperature source Oral, resp. rate 20, height 5' 4.17" (1.63 m), weight 63.5 kg, SpO2 99 %.Body mass index is 23.9 kg/m.  General Appearance: Casual  Eye Contact:  Good  Speech:  Clear and Coherent  Volume:  Decreased  Mood:  Depressed and Dysphoric  Affect:  Congruent and Constricted  Thought Process:  Linear and Descriptions of Associations: Circumstantial  Orientation:  Full (Time, Place, and Person)  Thought Content:  Logical and Hallucinations: Auditory  Suicidal Thoughts:  No  Homicidal Thoughts:  No  Memory:  Immediate;   Fair  Judgement:  Fair  Insight:  Fair  Psychomotor Activity:  Normal  Concentration:   Concentration: Fair  Recall:  [employer] of Knowledge:  Fair  Language:  Fair  Akathisia:  Negative  Handed:  Right  AIMS (if indicated):     Assets:  Communication Skills Desire for Improvement Housing Leisure Time Resilience  ADL's:  Intact  Cognition:  WNL  Sleep:  Number of Hours: 6.75      COGNITIVE FEATURES THAT CONTRIBUTE TO RISK:  Polarized thinking    SUICIDE RISK:   Mild:  Suicidal ideation of limited frequency, intensity, duration, and specificity.  There are no identifiable plans, no associated intent, mild dysphoria and related symptoms, good self-control (both objective and subjective assessment), few other risk factors, and identifiable protective factors, including available and accessible social support.  PLAN OF CARE: see orders  I certify that inpatient services furnished can reasonably be expected to improve the patient's condition.   Johnn Hai, MD 10/03/2018, 11:35 AM

## 2018-10-03 NOTE — Progress Notes (Signed)
Recreation Therapy Notes  Date: 10/03/2018 Time: 10:00 am Location: 500 hall   Group Topic: Triggers  Goal Area(s) Addresses:  Patient will work on worksheet on Triggers. Patient will follow directions on first prompt.  Behavioral Response: Appropriate  Intervention: Worksheet  Activity:  Staff on 500 hall were provided with a worksheet on Triggers. Staff was instructed to give it to the patients and have them work on it in place of [place]. Staff was also given 4 coloring sheets and were given the option to give them out.  Education:  Ability to follow Directions, Change of thought processes Discharge Planning, Goal Planning.   Education Outcome: Acknowledges education/In group clarification offered  Clinical Observations/Feedback: . Due to COVID-19, guidelines group was not held. Group members were provided a learning activity worksheet to work on the topic and above-stated goals. LRT is available to answer any questions patient may have regarding the worksheet.  Tomi Likens, LRT/CTRS         Jaci Desanto L Randy Castrejon 10/03/2018 10:42 AM

## 2018-10-04 MED ORDER — ALPRAZOLAM 0.5 MG PO TABS
1.0000 mg | ORAL_TABLET | Freq: Every day | ORAL | Status: DC
  Administered 2018-10-05 – 2018-10-06 (×2): 1 mg via ORAL
  Filled 2018-10-04 (×2): qty 2

## 2018-10-04 MED ORDER — ALPRAZOLAM 0.5 MG PO TABS
0.5000 mg | ORAL_TABLET | Freq: Two times a day (BID) | ORAL | Status: DC
  Administered 2018-10-05 – 2018-10-07 (×5): 0.5 mg via ORAL
  Filled 2018-10-04 (×5): qty 1
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
## 2018-10-04 NOTE — Progress Notes (Signed)
Recreation Therapy Notes  Date: 10/04/2018 Time: 10:00 am Location: 500 hall   Group Topic: Anger Thermometer  Goal Area(s) Addresses:  Patient will work on Academic librarian. Patient will follow directions on first prompt.  Behavioral Response: Appropriate  Intervention: Worksheet  Activity:  Staff on 500 hall were provided with a worksheet on Anger Thermometer. Staff was instructed to give it to the patients and have them work on it in place of [name]. Staff was also given 4 coloring sheets and were given the option to give them out.  Education:  Ability to follow Directions, Change of thought processes Discharge Planning, Goal Planning.   Education Outcome: Acknowledges education/In group clarification offered  Clinical Observations/Feedback: . Due to COVID-19, guidelines group was not held. Group members were provided a learning activity worksheet to work on the topic and above-stated goals. LRT is available to answer any questions patient may have regarding the worksheet.  Tomi Likens, LRT/CTRS         [name] 10/04/2018 10:05 AM

## 2018-10-04 NOTE — Tx Team (Signed)
Interdisciplinary Treatment and Diagnostic Plan Update  10/04/2018 Time of Session: 09:08am [name] MRN: [id number]  Principal Diagnosis: <principal problem not specified>  Secondary Diagnoses: Active Problems:   MDD (major depressive disorder), recurrent, severe, with psychosis ([place])   Current Medications:  Current Facility-Administered Medications  Medication Dose Route Frequency Provider Last Rate Last Dose  . acetaminophen (TYLENOL) tablet 650 mg  650 mg Oral Q6H PRN Mordecai Maes, NP      . albuterol (VENTOLIN HFA) 108 (90 Base) MCG/ACT inhaler 1-2 puff  1-2 puff Inhalation Q4H PRN Johnn Hai, MD      . ALPRAZolam Duanne Moron) tablet 1 mg  1 mg Oral TID Johnn Hai, MD   1 mg at 10/04/18 0820  . alum & mag hydroxide-simeth (MAALOX/MYLANTA) 200-200-20 MG/5ML suspension 30 mL  30 mL Oral Q4H PRN Mordecai Maes, NP      . aspirin chewable tablet 81 mg  81 mg Oral Daily Johnn Hai, MD   81 mg at 10/04/18 0820  . cloNIDine (CATAPRES) tablet 0.2 mg  0.2 mg Oral Q4H PRN Johnn Hai, MD      . estradiol (ESTRACE) tablet 2 mg  2 mg Oral Daily Johnn Hai, MD   2 mg at 10/04/18 0820  . FLUoxetine (PROZAC) capsule 20 mg  20 mg Oral Daily Johnn Hai, MD      . hydrOXYzine (ATARAX/VISTARIL) tablet 50 mg  50 mg Oral TID PRN Deloria Lair, NP      . magnesium hydroxide (MILK OF MAGNESIA) suspension 30 mL  30 mL Oral Daily PRN Mordecai Maes, NP      . medroxyPROGESTERone (PROVERA) tablet 2.5 mg  2.5 mg Oral Daily Johnn Hai, MD   2.5 mg at 10/04/18 0820  . pantoprazole (PROTONIX) EC tablet 40 mg  40 mg Oral Daily Johnn Hai, MD   40 mg at 10/04/18 0820  . perphenazine (TRILAFON) tablet 2 mg  2 mg Oral TID Johnn Hai, MD   2 mg at 10/04/18 0820  . temazepam (RESTORIL) capsule 30 mg  30 mg Oral QHS Johnn Hai, MD   30 mg at 10/03/18 2101   PTA Medications: Medications Prior to Admission  Medication Sig Dispense Refill Last Dose  . ALPRAZolam (XANAX) 1 MG tablet Take 1  mg by mouth 3 (three) times daily.  3   . amLODipine (NORVASC) 5 MG tablet Take 5 mg by mouth every morning.      [name] amphetamine-dextroamphetamine (ADDERALL) 20 MG tablet Take 20 mg by mouth 2 (two) times daily.   0   . aspirin EC 81 MG tablet Take 81 mg by mouth daily.     [name] atorvastatin (LIPITOR) 20 MG tablet Take 20 mg by mouth daily.  12   . cephALEXin (KEFLEX) 500 MG capsule TAKE 1 Capsule BY MOUTH EVERY NIGHT AT BEDTIME 30 capsule 5   . cetirizine (ZYRTEC) 10 MG tablet Take 10 mg by mouth daily.  5   . Cholecalciferol (VITAMIN D) 2000 units CAPS Take 2,000 Units by mouth daily.     . cyclobenzaprine (FLEXERIL) 10 MG tablet Take 20 mg by mouth at bedtime.      [name] DETROL LA 4 MG 24 hr capsule TAKE 1 Capsule BY MOUTH EVERY NIGHT AT BEDTIME 30 capsule 11   . estradiol (ESTRACE) 2 MG tablet Take 1 tablet (2 mg total) by mouth daily. 30 tablet 11   . furosemide (LASIX) 40 MG tablet Take 1 tablet by mouth daily as needed.     [name]  ibuprofen (ADVIL) 200 MG tablet Take 400 mg by mouth every 6 (six) hours as needed.     . linaclotide (LINZESS) 290 MCG CAPS capsule Take 290 mcg by mouth daily before breakfast.     . medroxyPROGESTERone (PROVERA) 2.5 MG tablet Take 1 tablet (2.5 mg total) by mouth daily. 30 tablet 11   . meloxicam (MOBIC) 15 MG tablet Take 15 mg by mouth daily.   5   . Menthol-Methyl Salicylate (MUSCLE RUB EX) Apply 1 application topically every 6 (six) hours as needed (Pain).      . mometasone (NASONEX) 50 MCG/ACT nasal spray Place 2 sprays into the nose daily.     [name] omeprazole (PRILOSEC) 20 MG capsule Take 1 capsule (20 mg total) by mouth every morning. 30 capsule 11   . ondansetron (ZOFRAN) 4 MG tablet Take 4-12 mg by mouth every 6 (six) hours as needed for nausea/vomiting.  5   . polyethylene glycol powder (GLYCOLAX/MIRALAX) powder Take 17 g by mouth daily as needed (constipation).     . potassium chloride SA (K-DUR) 20 MEQ tablet Take 20 mEq by mouth daily.      [name] PROVENTIL HFA 108  (90 Base) MCG/ACT inhaler Inhale 2 puffs into the lungs every 6 (six) hours as needed.  5   . vitamin C (ASCORBIC ACID) 500 MG tablet Take 500 mg by mouth daily.       Patient Stressors:    Patient Strengths:    Treatment Modalities: Medication Management, Group therapy, Case management,  1 to 1 session with clinician, Psychoeducation, Recreational therapy.   Physician Treatment Plan for Primary Diagnosis: <principal problem not specified> Long Term Goal(s): Improvement in symptoms so as ready for discharge Improvement in symptoms so as ready for discharge   Short Term Goals: Ability to identify changes in lifestyle to reduce recurrence of condition will improve Ability to verbalize feelings will improve Ability to disclose and discuss suicidal ideas Ability to demonstrate self-control will improve Ability to identify and develop effective coping behaviors will improve Compliance with prescribed medications will improve Ability to identify triggers associated with substance abuse/mental health issues will improve  Medication Management: Evaluate patient's response, side effects, and tolerance of medication regimen.  Therapeutic Interventions: 1 to 1 sessions, Unit Group sessions and Medication administration.  Evaluation of Outcomes: Not Met  Physician Treatment Plan for Secondary Diagnosis: Active Problems:   MDD (major depressive disorder), recurrent, severe, with psychosis ([place])  Long Term Goal(s): Improvement in symptoms so as ready for discharge Improvement in symptoms so as ready for discharge   Short Term Goals: Ability to identify changes in lifestyle to reduce recurrence of condition will improve Ability to verbalize feelings will improve Ability to disclose and discuss suicidal ideas Ability to demonstrate self-control will improve Ability to identify and develop effective coping behaviors will improve Compliance with prescribed medications will improve Ability to  identify triggers associated with substance abuse/mental health issues will improve     Medication Management: Evaluate patient's response, side effects, and tolerance of medication regimen.  Therapeutic Interventions: 1 to 1 sessions, Unit Group sessions and Medication administration.  Evaluation of Outcomes: Not Met   RN Treatment Plan for Primary Diagnosis: <principal problem not specified> Long Term Goal(s): Knowledge of disease and therapeutic regimen to maintain health will improve  Short Term Goals: Ability to participate in decision making will improve, Ability to verbalize feelings will improve, Ability to disclose and discuss suicidal ideas, Ability to identify and develop effective coping behaviors will  improve and Compliance with prescribed medications will improve  Medication Management: RN will administer medications as ordered by provider, will assess and evaluate patient's response and provide education to patient for prescribed medication. RN will report any adverse and/or side effects to prescribing provider.  Therapeutic Interventions: 1 on 1 counseling sessions, Psychoeducation, Medication administration, Evaluate responses to treatment, Monitor vital signs and CBGs as ordered, Perform/monitor CIWA, COWS, AIMS and Fall Risk screenings as ordered, Perform wound care treatments as ordered.  Evaluation of Outcomes: Not Met   LCSW Treatment Plan for Primary Diagnosis: <principal problem not specified> Long Term Goal(s): Safe transition to appropriate next level of care at discharge, Engage patient in therapeutic group addressing interpersonal concerns.  Short Term Goals: Engage patient in aftercare planning with referrals and resources and Increase skills for wellness and recovery  Therapeutic Interventions: Assess for all discharge needs, 1 to 1 time with Social worker, Explore available resources and support systems, Assess for adequacy in community support network,  Educate family and significant other(s) on suicide prevention, Complete Psychosocial Assessment, Interpersonal group therapy.  Evaluation of Outcomes: Not Met   Progress in Treatment: Attending groups: No. Participating in groups: No. Taking medication as prescribed: Yes. Toleration medication: Yes. Family/Significant other contact made: No, will contact:  will contact if given consent to contact Patient understands diagnosis: No. Discussing patient identified problems/goals with staff: Yes. Medical problems stabilized or resolved: Yes. Denies suicidal/homicidal ideation: Yes. Issues/concerns per patient self-inventory: No. Other:   New problem(s) identified: No, Describe:  None  New Short Term/Long Term Goal(s): Medication stabilization, elimination of SI thoughts, and development of a comprehensive mental wellness plan.   Patient Goals:    Discharge Plan or Barriers: CSW will continue to follow up for appropriate referrals and possible discharge planning  Reason for Continuation of Hospitalization: Depression Medication stabilization  Estimated Length of Stay: 2-3 days  Attendees: Patient: 10/04/2018   Physician: Dr. Johnn Hai, MD 10/04/2018   Nursing: Vladimir Faster, RN 10/04/2018   RN Care Manager: 10/04/2018   Social Worker: Ardelle Anton, LCSW 10/04/2018   Recreational Therapist:  10/04/2018  Other:  10/04/2018   Other:  10/04/2018   Other: 10/04/2018       Scribe for Treatment Team: Trecia Rogers, LCSW 10/04/2018 10:06 AM

## 2018-10-05 DIAGNOSIS — F333 Major depressive disorder, recurrent, severe with psychotic symptoms: Principal | ICD-10-CM

## 2018-10-05 NOTE — Progress Notes (Signed)
Adult Psychoeducational Group Note  Date:  10/05/2018 Time:  10:24 PM  Group Topic/Focus:  Wrap-Up Group:   The focus of this group is to help patients review their daily goal of treatment and discuss progress on daily workbooks.  Participation Level:  Minimal  Participation Quality:  Appropriate  Affect:  Appropriate  Cognitive:  Appropriate  Insight: Limited  Engagement in Group:  Limited  Modes of Intervention:  Discussion  Additional Comments: Pt stated her goal for today was to talk with her doctor about her discharge plan. Pt stated she accomplished her goal today, and the plan is for her to be discharge Monday. Pt stated her relationship with her family has stayed pretty much the same since coming here. Pt stated she felt better about herself today. Pt rated her over all day an 7 out of 10. Pt stated her appetite has improved today. Pt stated her goal for tonight was to get another good night's rest. Pt complained about no pain tonight. Pt stated that she would alert staff if anything changed.   Candy Sledge 10/05/2018, 10:24 PM

## 2018-10-05 NOTE — Progress Notes (Signed)
Adult Psychoeducational Group Note  Date:  10/05/2018 Time:  12:19 AM   Group Topic/Focus:  Wrap-Up Group:   The focus of this group is to help patients review their daily goal of treatment and discuss progress on daily workbooks.  Participation Level:  Did Not Attend  Participation Quality:  Did not attend  Affect:  Did not attend  Cognitive:  Did not attend  Insight: None  Engagement in Group:  Did not attend  Modes of Intervention:  Did not attend  Additional Comments:  Did not attend evening wrap up group tonight.  Candy Sledge 10/05/2018, 12:19 AM

## 2018-10-05 NOTE — Progress Notes (Signed)
D: Pt denies SI/HI/AVH. Pt is pleasant and cooperative. Pt stated she was feeling better this evening, feeling more awake. Pt still endorses forgetting things  And a little confusion this evening.  A: Pt was offered support and encouragement. Pt was given scheduled medications. Pt was encourage to attend groups. Q 15 minute checks were done for safety.  R:Pt receptive to treatment and safety maintained on unit.
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
## 2018-10-05 NOTE — Progress Notes (Signed)
Writer has observed patient lying in bed asleep since shift change. No distress noted. Patient is safe with 15 min checks.

## 2018-10-05 NOTE — Progress Notes (Signed)
Patient ID: [name], female   DOB: 1960/04/21, 58 y.o.   MRN: [id number]  [place] NOVEL CORONAVIRUS (COVID-19) DAILY CHECK-OFF SYMPTOMS - answer yes or no to each - every day NO YES  Have you had a fever in the past 24 hours?  . Fever (Temp > 37.80C / 100F) X   Have you had any of these symptoms in the past 24 hours? . New Cough .  Sore Throat  .  Shortness of Breath .  Difficulty Breathing .  Unexplained Body Aches   X   Have you had any one of these symptoms in the past 24 hours not related to allergies?   . Runny Nose .  Nasal Congestion .  Sneezing   X   If you have had runny nose, nasal congestion, sneezing in the past 24 hours, has it worsened?  X   EXPOSURES - check yes or no X   Have you traveled outside the state in the past 14 days?  X   Have you been in contact with someone with a confirmed diagnosis of COVID-19 or PUI in the past 14 days without wearing appropriate PPE?  X   Have you been living in the same home as a person with confirmed diagnosis of COVID-19 or a PUI (household contact)?    X   Have you been diagnosed with COVID-19?    X              What to do next: Answered NO to all: Answered YES to anything:   Proceed with unit schedule Follow the [facility] Inpatient Flowsheet.
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
## 2018-10-05 NOTE — Plan of Care (Signed)
D: Pt alert and oriented on the unit. Pt engaging with RN staff and other pts. Pt denies SI/HI, A/VH. Pt participated during unit groups and activities. Pt is pleasant and cooperative. A: Education, support and encouragement provided, q15 minute safety checks remain in effect. Medications administered per MD orders. R: No reactions/side effects to medicine noted. Pt denies any concerns at this time, and verbally contracts for safety. Pt ambulating on the unit with no issues. Pt remains safe on and off the unit.   Problem: Education: Goal: Verbalization of understanding the information provided will improve Outcome: Progressing   Problem: Coping: Goal: Ability to verbalize frustrations and anger appropriately will improve Outcome: Progressing Goal: Ability to demonstrate self-control will improve Outcome: Progressing   Problem: Coping: Goal: Ability to demonstrate self-control will improve Outcome: Progressing

## 2018-10-06 MED ORDER — NICOTINE POLACRILEX 2 MG MT GUM: 2.0000 mg | CHEWING_GUM | OROMUCOSAL | Status: DC | PRN

## 2018-10-06 NOTE — Progress Notes (Signed)
Adult Psychoeducational Group Note  Date:  10/06/2018 Time:  11:18 PM  Group Topic/Focus:  Wrap-Up Group:   The focus of this group is to help patients review their daily goal of treatment and discuss progress on daily workbooks.  Participation Level:  Minimal  Participation Quality:  Appropriate  Affect:  Appropriate  Cognitive:  Appropriate  Insight: Appropriate  Engagement in Group:  Developing/Improving  Modes of Intervention:  Discussion  Additional Comments: Pt stated her goal for today was to focus on her treatment plan. Pt stated she felt she accomplished her goal today. Pt stated her relationship with her family has stayed the same since she was admitted here. Pt stated she felt the same about herself today.Pt stated she wanted to talk to the doctor tomorrow because she still is a little confused. Pt rated her overall day a 8 out of 10. Pt stated her appetite was poor today. Pt stated her goal for tonight is to get some rest. Pt did complain of pain in her hip and having a headache. Pt nurse was made aware of the situation. Pt stated she would alert staff if anything changes.  Candy Sledge 10/06/2018, 11:18 PM

## 2018-10-06 NOTE — Progress Notes (Signed)
Patient ID: [name], female   DOB: [birth year], 58 y.o.   MRN: [id number]  [place] NOVEL CORONAVIRUS (COVID-19) DAILY CHECK-OFF SYMPTOMS - answer yes or no to each - every day NO YES  Have you had a fever in the past 24 hours?  . Fever (Temp > 37.80C / 100F) X   Have you had any of these symptoms in the past 24 hours? . New Cough .  Sore Throat  .  Shortness of Breath .  Difficulty Breathing .  Unexplained Body Aches   X   Have you had any one of these symptoms in the past 24 hours not related to allergies?   . Runny Nose .  Nasal Congestion .  Sneezing   X   If you have had runny nose, nasal congestion, sneezing in the past 24 hours, has it worsened?  X   EXPOSURES - check yes or no X   Have you traveled outside the state in the past 14 days?  X   Have you been in contact with someone with a confirmed diagnosis of COVID-19 or PUI in the past 14 days without wearing appropriate PPE?  X   Have you been living in the same home as a person with confirmed diagnosis of COVID-19 or a PUI (household contact)?    X   Have you been diagnosed with COVID-19?    X              What to do next: Answered NO to all: Answered YES to anything:   Proceed with unit schedule Follow the [facility] Inpatient Flowsheet.
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
## 2018-10-06 NOTE — Plan of Care (Signed)
D: Pt alert and oriented on the unit. Pt denies SI/HI, A/VH. Pt sat in the dayroom during the early part of the morning before returning to her room to shower and take a nap. Pt is pleasant and cooperative.  A: Education, support and encouragement provided, q15 minute safety checks remain in effect. Medications administered per MD orders.  R: No reactions/side effects to medicine noted. Pt denies any concerns at this time, and verbally contracts for safety. Pt ambulating on the unit with no issues. Pt remains safe on and off the unit.   Problem: Coping: Goal: Ability to demonstrate self-control will improve Outcome: Progressing   Problem: Safety: Goal: Periods of time without injury will increase Outcome: Progressing

## 2018-10-06 NOTE — Progress Notes (Signed)
Patient ID: [name], female   DOB: 16-Apr-1960, 58 y.o.   MRN: [id number] D: Patient has been sleeping in her room since beginning on shift. Pt presented with depressed mood and flat affect. Pt denies any complaints. Pt denies  SI/HI/AVH and pain.No behavioral issues noted.  A: Support and encouragement offered as needed to express needs. Medications administered as prescribed. Snack offered R: Patient is safe and cooperative on unit. Will continue to monitor  for safety and stability.

## 2018-10-07 MED ORDER — PERPHENAZINE 2 MG PO TABS: ORAL_TABLET | ORAL | 0 refills | Status: DC

## 2018-10-07 MED ORDER — FLUOXETINE HCL 20 MG PO CAPS: 20.0000 mg | ORAL_CAPSULE | Freq: Every day | ORAL | 1 refills | Status: AC

## 2018-10-07 MED ORDER — PERPHENAZINE 2 MG PO TABS
2.0000 mg | ORAL_TABLET | Freq: Every day | ORAL | Status: DC
  Filled 2018-10-07 (×2): qty 1

## 2018-10-07 MED ORDER — PERPHENAZINE 4 MG PO TABS
4.0000 mg | ORAL_TABLET | Freq: Every day | ORAL | Status: DC
  Filled 2018-10-07: qty 1

## 2018-10-07 MED ORDER — ALPRAZOLAM 0.5 MG PO TABS: ORAL_TABLET | ORAL | 0 refills | Status: AC

## 2018-10-07 NOTE — BHH Suicide Risk Assessment (Signed)
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 rate 18, height 5' 4.17" (1.63 m), weight 63.5 kg, SpO2 99 %.Body mass index is 23.9 kg/m.  General Appearance: Casual  Eye Contact::  Good  Speech:  Clear and Coherent409  Volume:  Normal  Mood:  Euthymic  Affect:  Congruent  Thought Process:  Coherent and Descriptions of Associations: Intact  Orientation:  Full (Time, Place, and Person)  Thought Content:  Logical  Suicidal Thoughts:  No  Homicidal Thoughts:  No  Memory:  Recent;   Good  Judgement:  Good  Insight:  Good  Psychomotor Activity:  Normal  Concentration:  Good  Recall:  Good  Fund of Knowledge:Good  Language: Good  Akathisia:  Negative  Handed:  Right  AIMS (if indicated):     Assets:  Communication Skills Desire for Improvement  Sleep:  Number of Hours: 6.25  Cognition: WNL  ADL's:  Intact   Mental Status Per Nursing Assessment::   On Admission:     Demographic Factors:  Unemployed  Loss Factors: Loss of significant relationship  Historical Factors: NA  Risk Reduction Factors:   Sense of responsibility to family and Religious beliefs about death  Continued Clinical Symptoms:  Alcohol/Substance Abuse/Dependencies  Cognitive Features That Contribute To Risk:  None    Suicide Risk:  Minimal: No identifiable suicidal ideation.  Patients presenting with no risk factors but with morbid ruminations; may be classified as minimal risk based on the severity of the depressive symptoms    Plan Of Care/Follow-up  recommendations:  Activity:  full  Nahzir Pohle, MD 10/07/2018, 8:06 AM

## 2018-10-07 NOTE — Plan of Care (Signed)
Discharge note  Patient verbalizes readiness for discharge. Follow up plan explained, AVS, Transition record and SRA given. Prescriptions and teaching provided. Belongings returned and signed for. Suicide safety plan completed and signed. Patient verbalizes understanding. Patient denies SI/HI and assures this writer she will seek assistance should that change. Patient discharged to lobby where Lyft was waiting.  Problem: Coping: Goal: Ability to identify and develop effective coping behavior will improve Outcome: Adequate for Discharge   Problem: Education: Goal: Knowledge of [facility] Education information/materials will improve Outcome: Adequate for Discharge Goal: Emotional status will improve Outcome: Adequate for Discharge Goal: Mental status will improve Outcome: Adequate for Discharge Goal: Verbalization of understanding the information provided will improve Outcome: Adequate for Discharge   Problem: Activity: Goal: Interest or engagement in activities will improve Outcome: Adequate for Discharge Goal: Sleeping patterns will improve Outcome: Adequate for Discharge   Problem: Coping: Goal: Ability to verbalize frustrations and anger appropriately will improve Outcome: Adequate for Discharge Goal: Ability to demonstrate self-control will improve Outcome: Adequate for Discharge   Problem: Health Behavior/Discharge Planning: Goal: Identification of resources available to assist in meeting health care needs will improve Outcome: Adequate for Discharge Goal: Compliance with treatment plan for underlying cause of condition will improve Outcome: Adequate for Discharge   Problem: Physical Regulation: Goal: Ability to maintain clinical measurements within normal limits will improve Outcome: Adequate for Discharge   Problem: Safety: Goal: Periods of time without injury will increase Outcome: Adequate for Discharge   Problem: Activity: Goal: Will verbalize the  importance of balancing activity with adequate rest periods Outcome: Adequate for Discharge   Problem: Education: Goal: Will be free of psychotic symptoms Outcome: Adequate for Discharge Goal: Knowledge of the prescribed therapeutic regimen will improve Outcome: Adequate for Discharge

## 2018-10-07 NOTE — Progress Notes (Signed)
Patient ID: CHRYL HOLTEN, female   DOB: August 18, 1960, 58 y.o.   MRN: [id number]   CSW scheduled [facility] lyft ride for 1:00pm. CSW informed pt's nurse.

## 2018-10-07 NOTE — Progress Notes (Signed)
  [facility] Adult Case Management Discharge Plan :  Will you be returning to the same living situation after discharge:  Yes,  home At discharge, do you have transportation home?: Yes,  [employer] for 1pm Do you have the ability to pay for your medications: No.  Release of information consent forms completed and in the chart;  Patient's signature needed at discharge.  Patient to Follow up at: Follow-up Information    Sinda Du, MD Follow up on 10/10/2018.   Specialty: Pulmonary Disease Why: PCP appointment is Thursday, 8/13 at 10:00a.  Please bring your current medications and discharge paperwork from this hospitlaization.  Contact information: [place] [address] [phone]        [facility] Follow up on 10/09/2018.   Why: Medication management appointment with Dr. Carlos Levering Su is Wednesday, 8/12 at 9:00a.  Please bring your current medications and discharge paperwork from this hospitalization.  Contact information: [address], [place], [place] [address] P: [phone] F: [phone]          Next level of care provider has access to [place] and Suicide Prevention discussed: Yes,  pt's daughter     Has patient been referred to the Quitline?: N/A patient is not a smoker  Patient has been referred for addiction treatment: Yes  Trecia Rogers, LCSW 10/07/2018, 11:26 AM

## 2018-10-07 NOTE — BHH Suicide Risk Assessment (Signed)
[place] INPATIENT:  Family/Significant Other Suicide Prevention Education  Suicide Prevention Education:  Education Completed; Pt's daughter, [name],  (name of family member/significant other) has been identified by the patient as the family member/significant other with whom the patient will be residing, and identified as the person(s) who will aid the patient in the event of a mental health crisis (suicidal ideations/suicide attempt).  With written consent from the patient, the family member/significant other has been provided the following suicide prevention education, prior to the and/or following the discharge of the patient.  The suicide prevention education provided includes the following:  Suicide risk factors  Suicide prevention and interventions  National Suicide Hotline telephone number  [facility] assessment telephone number  [facility] Emergency Assistance [place] and/or Residential Mobile Crisis Unit telephone number  Request made of family/significant other to:  Remove weapons (e.g., guns, rifles, knives), all items previously/currently identified as safety concern.    Remove drugs/medications (over-the-counter, prescriptions, illicit drugs), all items previously/currently identified as a safety concern.  The family member/significant other verbalizes understanding of the suicide prevention education information provided.  The family member/significant other agrees to remove the items of safety concern listed above.   CSW contacted pt's daughter, [name]. Pt's daughter asked if her mom had detoxed from the medication she overtook and asked about the medications that she was put on while in the hospital. Pt's daughter stated that it would be best if the patient was put in a lyft to her job (she provided her address) and then the daughter could get her home.   Trecia Rogers 10/07/2018, 10:16 AM
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
## 2018-10-07 NOTE — Patient Instructions (Addendum)
Medication Instructions:  Continue all current medications.  Labwork: none  Testing/Procedures: none  Follow-Up: Your physician wants you to follow up in:  1 year.  You will receive a reminder letter in the mail one-two months in advance.  If you don't receive a letter, please call our office to schedule the follow up appointment - Dr. Domenic Polite.   Any Other Special Instructions Will Be Listed Below (If Applicable).  If you need a refill on your cardiac medications before your next appointment, please call your pharmacy.  __________________________________________________   Lifestyle Modifications to Prevent and Treat Heart Disease -Recommend heart healthy/Mediterranean diet, with whole grains, fruits, vegetables, fish, lean meats, nuts, olive oil and avocado oil.  -Limit salt intake to less than 1500 mg per day.  -Recommend moderate walking, starting slowly with a few minutes and working up to 3-5 times/week for 30-50 minutes each session. Aim for at least 150 minutes.week. Goal should be pace of 3 miles/hours, or walking 1.5 miles in 30 minutes -Recommend avoidance of tobacco products. Avoid excess alcohol. -Keep blood pressure well controlled, ideally less than 130/80.      Edema  Edema is when you have too much fluid in your body or under your skin. Edema may make your legs, feet, and ankles swell up. Swelling is also common in looser tissues, like around your eyes. This is a common condition. It gets more common as you get older. There are many possible causes of edema. Eating too much salt (sodium) and being on your feet or sitting for a long time can cause edema in your legs, feet, and ankles. Hot weather may make edema worse. Edema is usually painless. Your skin may look swollen or shiny. Follow these instructions at home:  Keep the swollen body part raised (elevated) above the level of your heart when you are sitting or lying down.  Do not sit still or stand for a long  time.  Do not wear tight clothes. Do not wear garters on your upper legs.  Exercise your legs. This can help the swelling go down.  Wear elastic bandages or support stockings as told by your doctor.  Eat a low-salt (low-sodium) diet to reduce fluid as told by your doctor.  Depending on the cause of your swelling, you may need to limit how much fluid you drink (fluid restriction).  Take over-the-counter and prescription medicines only as told by your doctor. Contact a doctor if:  Treatment is not working.  You have heart, liver, or kidney disease and have symptoms of edema.  You have sudden and unexplained weight gain. Get help right away if:  You have shortness of breath or chest pain.  You cannot breathe when you lie down.  You have pain, redness, or warmth in the swollen areas.  You have heart, liver, or kidney disease and get edema all of a sudden.  You have a fever and your symptoms get worse all of a sudden. Summary  Edema is when you have too much fluid in your body or under your skin.  Edema may make your legs, feet, and ankles swell up. Swelling is also common in looser tissues, like around your eyes.  Raise (elevate) the swollen body part above the level of your heart when you are sitting or lying down.  Follow your doctor's instructions about diet and how much fluid you can drink (fluid restriction). This information is not intended to replace advice given to you by your health care provider. Make sure  you discuss any questions you have with your health care provider. Document Released: 08/02/2007 Document Revised: 02/16/2017 Document Reviewed: 03/03/2016 Elsevier Patient Education  2020 [employer].

## 2018-10-07 NOTE — Tx Team (Signed)
Interdisciplinary Treatment and Diagnostic Plan Update  10/07/2018 Time of Session: 09:08am [name] MRN: [id number]  Principal Diagnosis: <principal problem not specified>  Secondary Diagnoses: Active Problems:   MDD (major depressive disorder), recurrent, severe, with psychosis ([place])   Current Medications:  Current Facility-Administered Medications  Medication Dose Route Frequency Provider Last Rate Last Dose  . acetaminophen (TYLENOL) tablet 650 mg  650 mg Oral Q6H PRN Mordecai Maes, NP   650 mg at 10/06/18 1650  . albuterol (VENTOLIN HFA) 108 (90 Base) MCG/ACT inhaler 1-2 puff  1-2 puff Inhalation Q4H PRN Johnn Hai, MD      . ALPRAZolam Duanne Moron) tablet 0.5 mg  0.5 mg Oral BID Johnn Hai, MD   0.5 mg at 10/07/18 0807  . ALPRAZolam Duanne Moron) tablet 1 mg  1 mg Oral QHS Johnn Hai, MD   1 mg at 10/06/18 2041  . alum & mag hydroxide-simeth (MAALOX/MYLANTA) 200-200-20 MG/5ML suspension 30 mL  30 mL Oral Q4H PRN Mordecai Maes, NP      . aspirin chewable tablet 81 mg  81 mg Oral Daily Johnn Hai, MD   81 mg at 10/07/18 0806  . cloNIDine (CATAPRES) tablet 0.2 mg  0.2 mg Oral Q4H PRN Johnn Hai, MD      . estradiol (ESTRACE) tablet 2 mg  2 mg Oral Daily Johnn Hai, MD   2 mg at 10/07/18 4268  . FLUoxetine (PROZAC) capsule 20 mg  20 mg Oral Daily Johnn Hai, MD   20 mg at 10/07/18 3419  . hydrOXYzine (ATARAX/VISTARIL) tablet 50 mg  50 mg Oral TID PRN Deloria Lair, NP      . magnesium hydroxide (MILK OF MAGNESIA) suspension 30 mL  30 mL Oral Daily PRN Mordecai Maes, NP      . medroxyPROGESTERone (PROVERA) tablet 2.5 mg  2.5 mg Oral Daily Johnn Hai, MD   2.5 mg at 10/06/18 6222  . nicotine polacrilex (NICORETTE) gum 2 mg  2 mg Oral PRN Cobos, Myer Peer, MD      . pantoprazole (PROTONIX) EC tablet 40 mg  40 mg Oral Daily Johnn Hai, MD   40 mg at 10/07/18 9798  . perphenazine (TRILAFON) tablet 2 mg  2 mg Oral TID Johnn Hai, MD   2 mg at 10/07/18 0806  .  temazepam (RESTORIL) capsule 30 mg  30 mg Oral QHS Johnn Hai, MD   30 mg at 10/06/18 2041   PTA Medications: Medications Prior to Admission  Medication Sig Dispense Refill Last Dose  . ALPRAZolam (XANAX) 1 MG tablet Take 1 mg by mouth 3 (three) times daily.  3   . amLODipine (NORVASC) 5 MG tablet Take 5 mg by mouth every morning.      [name] amphetamine-dextroamphetamine (ADDERALL) 20 MG tablet Take 20 mg by mouth 2 (two) times daily.   0   . aspirin EC 81 MG tablet Take 81 mg by mouth daily.     [name] atorvastatin (LIPITOR) 20 MG tablet Take 20 mg by mouth daily.  12   . cephALEXin (KEFLEX) 500 MG capsule TAKE 1 Capsule BY MOUTH EVERY NIGHT AT BEDTIME 30 capsule 5   . cetirizine (ZYRTEC) 10 MG tablet Take 10 mg by mouth daily.  5   . Cholecalciferol (VITAMIN D) 2000 units CAPS Take 2,000 Units by mouth daily.     . cyclobenzaprine (FLEXERIL) 10 MG tablet Take 20 mg by mouth at bedtime.      [name] DETROL LA 4 MG 24 hr capsule  TAKE 1 Capsule BY MOUTH EVERY NIGHT AT BEDTIME 30 capsule 11   . estradiol (ESTRACE) 2 MG tablet Take 1 tablet (2 mg total) by mouth daily. 30 tablet 11   . furosemide (LASIX) 40 MG tablet Take 1 tablet by mouth daily as needed.     [name] ibuprofen (ADVIL) 200 MG tablet Take 400 mg by mouth every 6 (six) hours as needed.     . linaclotide (LINZESS) 290 MCG CAPS capsule Take 290 mcg by mouth daily before breakfast.     . medroxyPROGESTERone (PROVERA) 2.5 MG tablet Take 1 tablet (2.5 mg total) by mouth daily. 30 tablet 11   . meloxicam (MOBIC) 15 MG tablet Take 15 mg by mouth daily.   5   . Menthol-Methyl Salicylate (MUSCLE RUB EX) Apply 1 application topically every 6 (six) hours as needed (Pain).      . mometasone (NASONEX) 50 MCG/ACT nasal spray Place 2 sprays into the nose daily.     [name] omeprazole (PRILOSEC) 20 MG capsule Take 1 capsule (20 mg total) by mouth every morning. 30 capsule 11   . ondansetron (ZOFRAN) 4 MG tablet Take 4-12 mg by mouth every 6 (six) hours as needed for  nausea/vomiting.  5   . polyethylene glycol powder (GLYCOLAX/MIRALAX) powder Take 17 g by mouth daily as needed (constipation).     . potassium chloride SA (K-DUR) 20 MEQ tablet Take 20 mEq by mouth daily.      [name] PROVENTIL HFA 108 (90 Base) MCG/ACT inhaler Inhale 2 puffs into the lungs every 6 (six) hours as needed.  5   . vitamin C (ASCORBIC ACID) 500 MG tablet Take 500 mg by mouth daily.       Patient Stressors:    Patient Strengths:    Treatment Modalities: Medication Management, Group therapy, Case management,  1 to 1 session with clinician, Psychoeducation, Recreational therapy.   Physician Treatment Plan for Primary Diagnosis: <principal problem not specified> Long Term Goal(s): Improvement in symptoms so as ready for discharge Improvement in symptoms so as ready for discharge   Short Term Goals: Ability to identify changes in lifestyle to reduce recurrence of condition will improve Ability to verbalize feelings will improve Ability to disclose and discuss suicidal ideas Ability to demonstrate self-control will improve Ability to identify and develop effective coping behaviors will improve Compliance with prescribed medications will improve Ability to identify triggers associated with substance abuse/mental health issues will improve  Medication Management: Evaluate patient's response, side effects, and tolerance of medication regimen.  Therapeutic Interventions: 1 to 1 sessions, Unit Group sessions and Medication administration.  Evaluation of Outcomes: Adequate for Discharge  Physician Treatment Plan for Secondary Diagnosis: Active Problems:   MDD (major depressive disorder), recurrent, severe, with psychosis ([place])  Long Term Goal(s): Improvement in symptoms so as ready for discharge Improvement in symptoms so as ready for discharge   Short Term Goals: Ability to identify changes in lifestyle to reduce recurrence of condition will improve Ability to verbalize feelings  will improve Ability to disclose and discuss suicidal ideas Ability to demonstrate self-control will improve Ability to identify and develop effective coping behaviors will improve Compliance with prescribed medications will improve Ability to identify triggers associated with substance abuse/mental health issues will improve     Medication Management: Evaluate patient's response, side effects, and tolerance of medication regimen.  Therapeutic Interventions: 1 to 1 sessions, Unit Group sessions and Medication administration.  Evaluation of Outcomes: Adequate for Discharge   RN Treatment Plan  for Primary Diagnosis: <principal problem not specified> Long Term Goal(s): Knowledge of disease and therapeutic regimen to maintain health will improve  Short Term Goals: Ability to participate in decision making will improve, Ability to verbalize feelings will improve, Ability to disclose and discuss suicidal ideas, Ability to identify and develop effective coping behaviors will improve and Compliance with prescribed medications will improve  Medication Management: RN will administer medications as ordered by provider, will assess and evaluate patient's response and provide education to patient for prescribed medication. RN will report any adverse and/or side effects to prescribing provider.  Therapeutic Interventions: 1 on 1 counseling sessions, Psychoeducation, Medication administration, Evaluate responses to treatment, Monitor vital signs and CBGs as ordered, Perform/monitor CIWA, COWS, AIMS and Fall Risk screenings as ordered, Perform wound care treatments as ordered.  Evaluation of Outcomes: Adequate for Discharge   LCSW Treatment Plan for Primary Diagnosis: <principal problem not specified> Long Term Goal(s): Safe transition to appropriate next level of care at discharge, Engage patient in therapeutic group addressing interpersonal concerns.  Short Term Goals: Engage patient in aftercare  planning with referrals and resources and Increase skills for wellness and recovery  Therapeutic Interventions: Assess for all discharge needs, 1 to 1 time with Social worker, Explore available resources and support systems, Assess for adequacy in community support network, Educate family and significant other(s) on suicide prevention, Complete Psychosocial Assessment, Interpersonal group therapy.  Evaluation of Outcomes: Adequate for Discharge   Progress in Treatment: Attending groups: No. Participating in groups: No. Taking medication as prescribed: Yes. Toleration medication: Yes. Family/Significant other contact made: Yes, individual(s) contacted:  pts daughter Patient understands diagnosis: No. Discussing patient identified problems/goals with staff: Yes. Medical problems stabilized or resolved: Yes. Denies suicidal/homicidal ideation: Yes. Issues/concerns per patient self-inventory: No. Other:   New problem(s) identified: No, Describe:  None  New Short Term/Long Term Goal(s): Medication stabilization, elimination of SI thoughts, and development of a comprehensive mental wellness plan.   Patient Goals:    Discharge Plan or Barriers: SPE pamphlet, Mobile Crisis information, and AA/NA information provided to patient for additional community support and resources.Pt has an appointment scheduled with [facility] in [facility] on 10/09/18 and an appointment with her PCP on 8/13.  Reason for Continuation of Hospitalization: none  Estimated Length of Stay: Today 10/07/2018  Attendees: Patient: 10/04/2018   Physician: Dr. Johnn Hai, MD 10/04/2018   Nursing: Legrand Como, RN 10/04/2018   RN Care Manager: 10/04/2018   Social Worker: Evalina Field, LCSW 10/04/2018   Recreational Therapist:  10/04/2018  Other:  10/04/2018   Other:  10/04/2018   Other: 10/04/2018       Scribe for Treatment Team: Mariann Laster Hydie Langan, LCSW 10/07/2018 11:14 AM
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
## 2018-10-08 ENCOUNTER — Ambulatory Visit (INDEPENDENT_AMBULATORY_CARE_PROVIDER_SITE_OTHER): Payer: Medicaid Other | Admitting: Cardiology

## 2018-10-08 ENCOUNTER — Other Ambulatory Visit: Payer: Self-pay

## 2018-10-08 ENCOUNTER — Encounter: Payer: Self-pay | Admitting: Cardiology

## 2018-10-08 VITALS — BP 138/88 | HR 90 | Temp 98.1°F | Ht 62.0 in | Wt 149.0 lb

## 2018-10-08 DIAGNOSIS — E785 Hyperlipidemia, unspecified: Secondary | ICD-10-CM | POA: Diagnosis not present

## 2018-10-08 DIAGNOSIS — M7989 Other specified soft tissue disorders: Secondary | ICD-10-CM

## 2018-10-08 DIAGNOSIS — I350 Nonrheumatic aortic (valve) stenosis: Secondary | ICD-10-CM

## 2018-10-08 DIAGNOSIS — Z720 Tobacco use: Secondary | ICD-10-CM | POA: Diagnosis not present

## 2018-10-08 DIAGNOSIS — I10 Essential (primary) hypertension: Secondary | ICD-10-CM

## 2018-10-08 MED ORDER — AMLODIPINE BESYLATE 5 MG PO TABS: 5.0000 mg | ORAL_TABLET | Freq: Every day | ORAL | Status: AC

## 2018-10-14 ENCOUNTER — Telehealth: Payer: Self-pay | Admitting: Obstetrics & Gynecology

## 2018-10-14 ENCOUNTER — Other Ambulatory Visit: Payer: Self-pay | Admitting: Obstetrics & Gynecology

## 2018-10-14 MED ORDER — CIPROFLOXACIN HCL 500 MG PO TABS: 500.0000 mg | ORAL_TABLET | Freq: Two times a day (BID) | ORAL | 0 refills | Status: DC

## 2018-10-14 NOTE — Telephone Encounter (Signed)
Pt has burning with urination and back pain. Pt is requesting something for a UTI and also Tylenol #3 for pain from UTI. Please advise. Thanks!! [place]

## 2018-10-14 NOTE — Telephone Encounter (Signed)
Patient called back, stated she doesn't need nausea medication, she wants Tylenol 3.  [facility]  [phone]

## 2018-10-14 NOTE — Telephone Encounter (Signed)
Pt would like to see if medication for a UTI and also tylenol 3 can be sent in for pain.

## 2018-10-14 NOTE — Telephone Encounter (Signed)
cipro prescribed

## 2018-10-14 NOTE — Telephone Encounter (Signed)
Patient informed Dr Elonda Husky sent in an antibiotic not nausea medication since she thinks she has an UTI.  Advised to start taking the medication and hopefully that will ease her back pain.  Advised to also try extra strength Tylenol.  Pt states pharmacy will not be able to deliver her medication until tomorrow as there "was a mix up there".  Apologized to patient and advised to start taking medication when she gets it and to take all as prescribed. Pt verbalized understanding.

## 2018-10-21 ENCOUNTER — Other Ambulatory Visit: Payer: Self-pay | Admitting: Dermatology

## 2018-10-26 ENCOUNTER — Other Ambulatory Visit: Payer: Self-pay

## 2018-10-26 ENCOUNTER — Emergency Department (HOSPITAL_COMMUNITY)
Admission: EM | Admit: 2018-10-26 | Discharge: 2018-10-28 | Disposition: A | Discharge status: Other Acute Inpatient Hospital | Payer: Medicaid Other | Attending: Emergency Medicine | Admitting: Emergency Medicine

## 2018-10-26 ENCOUNTER — Encounter (HOSPITAL_COMMUNITY): Payer: Self-pay | Admitting: Emergency Medicine

## 2018-10-26 DIAGNOSIS — F411 Generalized anxiety disorder: Secondary | ICD-10-CM | POA: Insufficient documentation

## 2018-10-26 DIAGNOSIS — B182 Chronic viral hepatitis C: Secondary | ICD-10-CM | POA: Insufficient documentation

## 2018-10-26 DIAGNOSIS — Z7982 Long term (current) use of aspirin: Secondary | ICD-10-CM | POA: Insufficient documentation

## 2018-10-26 DIAGNOSIS — Z20828 Contact with and (suspected) exposure to other viral communicable diseases: Secondary | ICD-10-CM | POA: Diagnosis not present

## 2018-10-26 DIAGNOSIS — F609 Personality disorder, unspecified: Secondary | ICD-10-CM | POA: Diagnosis not present

## 2018-10-26 DIAGNOSIS — J45909 Unspecified asthma, uncomplicated: Secondary | ICD-10-CM | POA: Insufficient documentation

## 2018-10-26 DIAGNOSIS — I10 Essential (primary) hypertension: Secondary | ICD-10-CM | POA: Diagnosis not present

## 2018-10-26 DIAGNOSIS — Z79899 Other long term (current) drug therapy: Secondary | ICD-10-CM | POA: Diagnosis not present

## 2018-10-26 DIAGNOSIS — Z87891 Personal history of nicotine dependence: Secondary | ICD-10-CM | POA: Insufficient documentation

## 2018-10-26 DIAGNOSIS — F333 Major depressive disorder, recurrent, severe with psychotic symptoms: Secondary | ICD-10-CM | POA: Insufficient documentation

## 2018-10-26 HISTORY — DX: Other chronic pain: G89.29

## 2018-10-26 LAB — COMPREHENSIVE METABOLIC PANEL
ALT: 17 U/L (ref 0–44)
AST: 23 U/L (ref 15–41)
Albumin: 4 g/dL (ref 3.5–5.0)
Alkaline Phosphatase: 56 U/L (ref 38–126)
Anion gap: 7 (ref 5–15)
BUN: 6 mg/dL (ref 6–20)
CO2: 26 mmol/L (ref 22–32)
Calcium: 8.7 mg/dL — ABNORMAL LOW (ref 8.9–10.3)
Chloride: 106 mmol/L (ref 98–111)
Creatinine, Ser: 0.69 mg/dL (ref 0.44–1.00)
GFR calc Af Amer: >60 mL/min (ref >60)
GFR calc non Af Amer: >60 mL/min (ref >60)
Glucose, Bld: 98 mg/dL (ref 70–99)
Potassium: 3.8 mmol/L (ref 3.5–5.1)
Sodium: 139 mmol/L (ref 135–145)
Total Bilirubin: 0.3 mg/dL (ref 0.3–1.2)
Total Protein: 6.4 g/dL — ABNORMAL LOW (ref 6.5–8.1)

## 2018-10-26 LAB — CBC WITH DIFFERENTIAL/PLATELET
Abs Immature Granulocytes: 0 10*3/uL (ref 0.00–0.07)
Basophils Absolute: 0 10*3/uL (ref 0.0–0.1)
Basophils Relative: 1 %
Eosinophils Absolute: 0 10*3/uL (ref 0.0–0.5)
Eosinophils Relative: 1 %
HCT: 34.6 % — ABNORMAL LOW (ref 36.0–46.0)
Hemoglobin: 11 g/dL — ABNORMAL LOW (ref 12.0–15.0)
Immature Granulocytes: 0 %
Lymphocytes Relative: 38 %
Lymphs Abs: 1.7 10*3/uL (ref 0.7–4.0)
MCH: 27.4 pg (ref 26.0–34.0)
MCHC: 31.8 g/dL (ref 30.0–36.0)
MCV: 86.3 fL (ref 80.0–100.0)
Monocytes Absolute: 0.3 10*3/uL (ref 0.1–1.0)
Monocytes Relative: 7 %
Neutro Abs: 2.4 10*3/uL (ref 1.7–7.7)
Neutrophils Relative %: 53 %
Platelets: 187 10*3/uL (ref 150–400)
RBC: 4.01 MIL/uL (ref 3.87–5.11)
RDW: 13 % (ref 11.5–15.5)
WBC: 4.5 10*3/uL (ref 4.0–10.5)
nRBC: 0 % (ref 0.0–0.2)

## 2018-10-26 LAB — RAPID URINE DRUG SCREEN, HOSP PERFORMED
Amphetamines: POSITIVE — AB
Barbiturates: NOT DETECTED
Benzodiazepines: POSITIVE — AB
Cocaine: NOT DETECTED
Opiates: NOT DETECTED
Tetrahydrocannabinol: NOT DETECTED

## 2018-10-26 LAB — URINALYSIS, ROUTINE W REFLEX MICROSCOPIC
Bilirubin Urine: NEGATIVE
Glucose, UA: NEGATIVE mg/dL
Hgb urine dipstick: NEGATIVE
Ketones, ur: NEGATIVE mg/dL
Leukocytes,Ua: NEGATIVE
Nitrite: NEGATIVE
Protein, ur: NEGATIVE mg/dL
Specific Gravity, Urine: 1.009 (ref 1.005–1.030)
pH: 5 (ref 5.0–8.0)

## 2018-10-26 LAB — ETHANOL: Alcohol, Ethyl (B): <10 mg/dL (ref <10)

## 2018-10-26 MED ORDER — ACETAMINOPHEN 500 MG PO TABS
1000.0000 mg | ORAL_TABLET | Freq: Once | ORAL | Status: AC
  Administered 2018-10-26: 23:00:00 1000 mg via ORAL
  Filled 2018-10-26: qty 2

## 2018-10-26 NOTE — ED Notes (Signed)
Pt resting, eyes closed, lights off.  Visible chest rise and fall.
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
## 2018-10-26 NOTE — ED Triage Notes (Signed)
Pt c/o anxiety.  Hallucinations was listed as initial chief complaint given by pt's daughter but pt says she is not hallucinating.  Pt stated, "she doesn't believe anything I say."  Asked pt if her daughter could come to triage to collaborate but pt didn't want her involved.   Pt says she was admitted to [facility] earlier this month and they changed some of her medications.  Denies SI or HI.    Pt also c/o back pain radiating down both legs, worse in left leg.

## 2018-10-26 NOTE — B[facility] Counselor (Signed)
Disposition  Meredith Pickles, NP and Meredith Libra, NP recommend inpatient treatment

## 2018-10-27 MED ORDER — LORATADINE 10 MG PO TABS
10.0000 mg | ORAL_TABLET | Freq: Every day | ORAL | Status: DC
  Administered 2018-10-27 – 2018-10-28 (×2): 10 mg via ORAL
  Filled 2018-10-27 (×2): qty 1

## 2018-10-27 MED ORDER — FLUOXETINE HCL 20 MG PO CAPS
20.0000 mg | ORAL_CAPSULE | Freq: Every day | ORAL | Status: DC
  Administered 2018-10-27 – 2018-10-28 (×2): 20 mg via ORAL
  Filled 2018-10-27 (×2): qty 1

## 2018-10-27 MED ORDER — IBUPROFEN 400 MG PO TABS
400.0000 mg | ORAL_TABLET | Freq: Four times a day (QID) | ORAL | Status: DC | PRN
  Administered 2018-10-27: 13:00:00 400 mg via ORAL
  Filled 2018-10-27: qty 1

## 2018-10-27 MED ORDER — ALBUTEROL SULFATE HFA 108 (90 BASE) MCG/ACT IN AERS: 2.0000 | INHALATION_SPRAY | Freq: Four times a day (QID) | RESPIRATORY_TRACT | Status: DC | PRN

## 2018-10-27 MED ORDER — PERPHENAZINE 2 MG PO TABS: 2.0000 mg | ORAL_TABLET | ORAL | Status: DC

## 2018-10-27 MED ORDER — AMPHETAMINE-DEXTROAMPHETAMINE 10 MG PO TABS
15.0000 mg | ORAL_TABLET | Freq: Two times a day (BID) | ORAL | Status: DC
  Administered 2018-10-27 – 2018-10-28 (×2): 15 mg via ORAL
  Filled 2018-10-27 (×2): qty 2

## 2018-10-27 MED ORDER — ONDANSETRON HCL 4 MG PO TABS: 4.0000 mg | ORAL_TABLET | Freq: Four times a day (QID) | ORAL | Status: DC | PRN

## 2018-10-27 MED ORDER — PERPHENAZINE 4 MG PO TABS
4.0000 mg | ORAL_TABLET | Freq: Every day | ORAL | Status: AC
  Administered 2018-10-27: 22:00:00 4 mg via ORAL
  Filled 2018-10-27: qty 1

## 2018-10-27 MED ORDER — ASPIRIN EC 81 MG PO TBEC
81.0000 mg | DELAYED_RELEASE_TABLET | Freq: Every day | ORAL | Status: DC
  Administered 2018-10-27 – 2018-10-28 (×2): 81 mg via ORAL
  Filled 2018-10-27 (×2): qty 1

## 2018-10-27 MED ORDER — ALPRAZOLAM 0.5 MG PO TABS
0.5000 mg | ORAL_TABLET | Freq: Three times a day (TID) | ORAL | Status: DC | PRN
  Administered 2018-10-27 – 2018-10-28 (×2): 0.5 mg via ORAL
  Filled 2018-10-27 (×2): qty 1

## 2018-10-27 MED ORDER — PANTOPRAZOLE SODIUM 40 MG PO TBEC
40.0000 mg | DELAYED_RELEASE_TABLET | Freq: Every day | ORAL | Status: DC
  Administered 2018-10-27 – 2018-10-28 (×2): 40 mg via ORAL
  Filled 2018-10-27 (×2): qty 1

## 2018-10-27 MED ORDER — FLUTICASONE PROPIONATE 50 MCG/ACT NA SUSP
2.0000 | Freq: Every day | NASAL | Status: DC
  Administered 2018-10-27 – 2018-10-28 (×2): 2 via NASAL
  Filled 2018-10-27: qty 16

## 2018-10-27 MED ORDER — AMLODIPINE BESYLATE 5 MG PO TABS
5.0000 mg | ORAL_TABLET | Freq: Every day | ORAL | Status: DC
  Administered 2018-10-27 – 2018-10-28 (×2): 5 mg via ORAL
  Filled 2018-10-27 (×2): qty 1

## 2018-10-27 MED ORDER — LINACLOTIDE 145 MCG PO CAPS
290.0000 ug | ORAL_CAPSULE | Freq: Every day | ORAL | Status: DC
  Administered 2018-10-27 – 2018-10-28 (×2): 290 ug via ORAL
  Filled 2018-10-27 (×4): qty 2

## 2018-10-27 MED ORDER — ATORVASTATIN CALCIUM 10 MG PO TABS
20.0000 mg | ORAL_TABLET | Freq: Every day | ORAL | Status: DC
  Administered 2018-10-27 – 2018-10-28 (×2): 20 mg via ORAL
  Filled 2018-10-27 (×2): qty 2

## 2018-10-27 MED ORDER — LURASIDONE HCL 20 MG PO TABS
20.0000 mg | ORAL_TABLET | Freq: Every day | ORAL | Status: DC
  Administered 2018-10-27: 11:00:00 20 mg via ORAL
  Filled 2018-10-27 (×4): qty 1

## 2018-10-27 NOTE — ED Notes (Signed)
TTS at bedside. 

## 2018-10-27 NOTE — ED Notes (Signed)
Contacted pharmacy to obtain flonase, waiting for delivery

## 2018-10-27 NOTE — Progress Notes (Signed)
Patient has been accepted at [facility], per Lauren (intake/admissions), tomorrow 10/28/2018 after 10:00. Accepting provider is Dr. Enzo Bi, MD. Number to call report is [phone]. CSW notified Verline Lema, RN regarding disposition.   Chalmers Guest. Guerry Bruin, MSW, [place] Work/Disposition Phone: [phone] Fax: [phone]

## 2018-10-27 NOTE — Progress Notes (Signed)
Patient meets criteria for inpatient treatment. No appropriate or available beds at [facility]. CSW faxed referrals to the following facilities for review:  [facility]  [facility]  [facility]  [facility]  [facility]  [facility]   TTS will continue to seek bed placement.  Chalmers Guest. Guerry Bruin, MSW, [place] Work/Disposition Phone: [phone] Fax: [phone]

## 2018-10-27 NOTE — ED Notes (Signed)
Pt does not have any daily medications ordered. Dr Thurnell Garbe informed of this.

## 2018-10-27 NOTE — ED Notes (Signed)
Dispo: [facility]  Can be admitted tomorrow after 10 am. Accepting: Dr. Abbey Chatters Attending: Dr. Abbey Chatters  Report: [phone]

## 2018-10-27 NOTE — [facility] Counselor (Signed)
Pt was reassessed today. She was tearful.  Mood was reported as depressed.  Affect was labile.  Pt reported significant back pain.  Pt also endorsed continued auditory hallucination.  Speech was tangential.  Pt reported that she has significant grief due to death of father, best friend, and uncle.  She also said, ''My son.... I lost my mind.''  Difficult to redirect Pt.  Pt asked to see a grief counselor.  Recommend continued inpatient.  From assessment:  'anxiety', patient daughter states pt is having "having hallucinations." TTS assessed patient. Patient reported she has not sleep much due to hearing noises. Report she hears hard footprints. Patient speaking in tangential language at times during the assessment. Patient report having back pain. Patient in and out of conscious during assessment. Report she has not been able to sleep in the past couple days. Report having bad panic attacks. Report 15-20 years ago was told she had screen then she jumped to talking about back pain.

## 2018-10-28 LAB — SARS CORONAVIRUS 2 BY RT PCR (HOSPITAL ORDER, PERFORMED IN ~~LOC~~ HOSPITAL LAB): SARS Coronavirus 2: NEGATIVE

## 2018-11-11 ENCOUNTER — Ambulatory Visit: Payer: Medicaid Other | Admitting: Gastroenterology

## 2018-11-29 ENCOUNTER — Other Ambulatory Visit (HOSPITAL_COMMUNITY): Payer: Self-pay | Admitting: Obstetrics & Gynecology

## 2018-11-29 DIAGNOSIS — Z1231 Encounter for screening mammogram for malignant neoplasm of breast: Secondary | ICD-10-CM

## 2018-12-02 ENCOUNTER — Telehealth: Payer: Self-pay | Admitting: Orthopedic Surgery

## 2018-12-02 NOTE — Telephone Encounter (Signed)
Spoke with patient upon her return call. Patient asked (1) if we can "read off what is wrong with her back", from MRI done in May of 2019 - which I relayed the policy of signing authorization for copy of MRI report to be provided to her - will come to office, or request directly at [facility] radiology while there for other imaging on 12/12/18.  (2) can Dr Aline Brochure prescribe something for the pain - relayed that she will need to contact her primary care, Dr Luan Pulling, as we have not seen patient here since then; said will call him. (3) Can Dr Aline Brochure refer to a pain clinic - relayed again, best to request primary care to refer, as he has seen patient recently, and can provide all patient's history, needed to make a referral. Voiced understanding.

## 2018-12-02 NOTE — Telephone Encounter (Signed)
Patient called this morning, 12/04/18; states someone called her back; relayed she had seen Dr Aline Brochure a while back; aware will review notes and call her back to discuss her concern.

## 2018-12-06 ENCOUNTER — Ambulatory Visit: Payer: Medicaid Other | Admitting: Gastroenterology

## 2018-12-12 ENCOUNTER — Ambulatory Visit (HOSPITAL_COMMUNITY)
Admission: RE | Admit: 2018-12-12 | Discharge: 2018-12-12 | Disposition: A | Discharge status: Home / Self Care | Payer: Medicaid Other | Source: Ambulatory Visit | Attending: Obstetrics & Gynecology | Admitting: Obstetrics & Gynecology

## 2018-12-12 ENCOUNTER — Other Ambulatory Visit: Payer: Self-pay

## 2018-12-12 DIAGNOSIS — Z1231 Encounter for screening mammogram for malignant neoplasm of breast: Secondary | ICD-10-CM | POA: Insufficient documentation

## 2018-12-16 ENCOUNTER — Other Ambulatory Visit (HOSPITAL_COMMUNITY): Payer: Self-pay | Admitting: Obstetrics & Gynecology

## 2018-12-16 DIAGNOSIS — R928 Other abnormal and inconclusive findings on diagnostic imaging of breast: Secondary | ICD-10-CM
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
## 2018-12-20 ENCOUNTER — Ambulatory Visit (INDEPENDENT_AMBULATORY_CARE_PROVIDER_SITE_OTHER): Payer: Medicaid Other | Admitting: Gastroenterology

## 2018-12-20 ENCOUNTER — Other Ambulatory Visit: Payer: Self-pay

## 2018-12-20 ENCOUNTER — Encounter: Payer: Self-pay | Admitting: Gastroenterology

## 2018-12-20 VITALS — BP 118/71 | HR 105 | Temp 96.8°F | Ht 62.0 in | Wt 149.8 lb

## 2018-12-20 DIAGNOSIS — R197 Diarrhea, unspecified: Secondary | ICD-10-CM | POA: Insufficient documentation

## 2018-12-20 DIAGNOSIS — R1310 Dysphagia, unspecified: Secondary | ICD-10-CM | POA: Diagnosis not present

## 2018-12-20 DIAGNOSIS — K649 Unspecified hemorrhoids: Secondary | ICD-10-CM | POA: Diagnosis not present

## 2018-12-20 DIAGNOSIS — K219 Gastro-esophageal reflux disease without esophagitis: Secondary | ICD-10-CM | POA: Diagnosis not present

## 2018-12-20 NOTE — Patient Instructions (Addendum)
You may hold Linzess while you are having diarrhea.  Resume Linzess as bowels returned to normal. Use MiraLAX once to twice daily as needed. If your diarrhea continues, please let us know.    We will get you scheduled with Roseanne Kaufman, NP in about 4 weeks for a possible hemorrhoid banding.  Continue to avoid straining and limit toilet time to 2 to 3 minutes.   Continue to use [employer] brand hemorrhoid cream as needed for hemorrhoid flares.  Unfortunately we do not have any Tucks pads at this time.  You can purchase these over-the-counter.  Aliene Altes, PA-C [facility]

## 2018-12-20 NOTE — Assessment & Plan Note (Signed)
Patient reports intermittent dysphagia with sensation of food hanging at her sternal notch.  She had esophageal dilation in 2018 with overall improvement of symptoms.  She continued to have occasional intermittent symptoms after her dilation that her not worsening, just persistent.  She only has top dentures.  States she cannot chew some foods well and this is why her food gets hung at times.  GERD symptoms are well controlled.  Patient states her dentist needs a letter from Dr. [name] stating he has dilated her esophagus in the past and that her dysphagia is likely secondary to her inability to chew well so that she can get her bottom dentures.  States she has already sent a note to the nurses about this.  I agree that patient's intermittent dysphagia is likely secondary to not being able to chew her foods well. I do not think she needs repeat EGD at this time.  Advised patient to follow a soft mechanical diet with ground meats for now.  Asking nursing staff about getting a letter for the patient regarding her dysphagia.

## 2018-12-20 NOTE — Assessment & Plan Note (Signed)
Patient has history of grade 2-3 hemorrhoids status post several hemorrhoid banding's in the past.  Last hemorrhoid banding on 08/01/2018.  At that time stated she may need 1 additional banding.  Patient is requesting additional banding today.  States she continues to have intermittent rectal bleeding and bleeding/burning when she wipes.  Typically associated with hard/large stools.  Reports after banding, she seems to always have return of constipation and return of hemorrhoid symptoms.  She has not had any recent bleeding.  Uses [employer] hemorrhoid cream as needed which works well.  Currently with 3 runny stools daily for the last 2 days.  Started after drinking carma water.  She has also started 3 new psych meds which could be playing a role. Do not suspect infectious etiology. Still taking Linzess 290 mcg. Prior to diarrhea, stools were daily, but she felt like she wasn't emptying well.  Has not been taking MiraLAX.  Let patient know she could hold Linzess while she is having diarrhea.  She should resume the Linzess once her stools returned to normal and use MiraLAX daily to twice daily as needed. If diarrhea continues, patient is to let us know. Avoid straining and limit toilet time to 2-3 minutes. Follow-up in 4-6 weeks with Roseanne Kaufman, NP for possible hemorrhoid banding.

## 2018-12-20 NOTE — Assessment & Plan Note (Addendum)
Patient has history of chronic constipation on Linzess 290 mcg daily with MiraLAX as needed.  Currently with 3 runny stools daily for the last 2 days.  No nocturnal stools.  No recent antibiotics.  Has been in amount of mental health facilities recently.  Diarrhea started after drinking carma water.  She has also started 3 new psych meds which could be playing a role. Do not suspect infectious etiology. Still taking Linzess 290 mcg. Prior to diarrhea, stools were daily, but she felt like she wasn't emptying well.  Has not been taking MiraLAX.   Advised patient that she could hold Linzess while she is having diarrhea.  She should resume the Linzess once her stools returned to normal and use MiraLAX daily to twice daily as needed. If diarrhea continues, patient is to let us know.

## 2018-12-20 NOTE — Assessment & Plan Note (Signed)
GERD symptoms are well controlled on omeprazole 20 mg daily.  Continue current medications.

## 2018-12-23 ENCOUNTER — Telehealth: Payer: Self-pay

## 2018-12-23 NOTE — Telephone Encounter (Signed)
Cyril Mourning, Is it ok to do a letter for her?

## 2018-12-23 NOTE — Telephone Encounter (Signed)
Yes. She had empiric dilation in 2018. Being edentulous is not helping her trouble swallowing.

## 2018-12-23 NOTE — Telephone Encounter (Signed)
Letter done, tried to call pt- NA

## 2018-12-23 NOTE — Telephone Encounter (Signed)
Then patient saw Aliene Altes, [place] on Friday 12/20/2018, she requested a letter from Dr. [name] stating that he had dilated her esophagus in the past and her difficulty swallowing was likely secondary to her inability to chew if she did not have bottom dentures. She said she needs this so her dentist can do her bottom dentures.   I called pt to see when she needs this and there were many rings and no answer.   Forwarding to Dr [name] and also to [place] to advise since Dr. [name] is out of the office this week.

## 2018-12-24 ENCOUNTER — Ambulatory Visit (HOSPITAL_COMMUNITY): Admission: RE | Admit: 2018-12-24 | Payer: Medicaid Other | Source: Ambulatory Visit

## 2018-12-24 ENCOUNTER — Encounter (HOSPITAL_COMMUNITY): Payer: Self-pay

## 2018-12-24 ENCOUNTER — Inpatient Hospital Stay (HOSPITAL_COMMUNITY): Admission: RE | Admit: 2018-12-24 | Payer: Medicaid Other | Source: Ambulatory Visit

## 2018-12-24 ENCOUNTER — Telehealth: Payer: Self-pay | Admitting: *Deleted

## 2018-12-24 NOTE — Telephone Encounter (Signed)
Pt has burning with urination. No other symptoms. Drinking water.

## 2018-12-24 NOTE — Telephone Encounter (Signed)
She needs a nurse visit in am and give [place] a urine so we can see if she indeed has a UTI, she has had some negative cultures in past

## 2018-12-24 NOTE — Telephone Encounter (Signed)
Pt has burning with urination. No other symptoms. She is drinking water. Can you order something? Thanks!! [place]

## 2018-12-24 NOTE — Telephone Encounter (Signed)
Patient left message that she thinks she has a uti. Wanted to see if provider would send in abx.

## 2018-12-24 NOTE — Telephone Encounter (Signed)
Tried to call pt- NA 

## 2018-12-24 NOTE — Telephone Encounter (Signed)
No answer @ 12:00 pm. JSY

## 2018-12-25 NOTE — Telephone Encounter (Signed)
Pt informed that she will need to come in for urine sample. Appt scheduled.

## 2018-12-30 NOTE — Telephone Encounter (Signed)
Angie attempted to call pt on Friday 12/27/18 and did not get an answer or voicemail. I have mailed her letter to her home address.

## 2018-12-30 NOTE — Telephone Encounter (Signed)
Noted  

## 2018-12-31 ENCOUNTER — Telehealth: Payer: Self-pay | Admitting: Obstetrics & Gynecology

## 2018-12-31 NOTE — Telephone Encounter (Signed)

## 2019-01-01 ENCOUNTER — Other Ambulatory Visit: Payer: Medicaid Other

## 2019-01-07 ENCOUNTER — Inpatient Hospital Stay (HOSPITAL_COMMUNITY): Admission: RE | Admit: 2019-01-07 | Payer: Medicaid Other | Source: Ambulatory Visit

## 2019-01-07 ENCOUNTER — Ambulatory Visit (HOSPITAL_COMMUNITY): Payer: Medicaid Other

## 2019-01-14 ENCOUNTER — Ambulatory Visit: Payer: Medicaid Other | Admitting: Gastroenterology

## 2019-01-16 ENCOUNTER — Telehealth: Payer: Self-pay | Admitting: Internal Medicine

## 2019-01-16 ENCOUNTER — Ambulatory Visit: Payer: Medicaid Other | Admitting: Gastroenterology

## 2019-01-16 ENCOUNTER — Encounter: Payer: Self-pay | Admitting: Internal Medicine

## 2019-01-16 NOTE — Telephone Encounter (Signed)
PATIENT WAS A NO SHOW AND LETTER SENT  °

## 2019-01-28 DEATH — deceased

## 2019-02-06 ENCOUNTER — Other Ambulatory Visit: Payer: Medicaid Other | Admitting: Adult Health

## 2019-04-14 ENCOUNTER — Encounter: Payer: Self-pay | Admitting: Internal Medicine

## 2019-04-14 ENCOUNTER — Telehealth: Payer: Self-pay | Admitting: *Deleted

## 2019-04-14 NOTE — Telephone Encounter (Signed)
RECALL FOR ULTRASOUND 

## 2019-04-14 NOTE — Telephone Encounter (Signed)
Letter mailed

## 2019-10-04 IMAGING — NM NM MYOCAR MULTI W/SPECT W/WALL MOTION & EF
2 series · 12 of 12 positions shown · non-contrast
Comparison: none

[Series 1: rest · 6.51mm/px · 6 of 64 frames shown]
[frame 6/64]
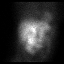
[frame 16/64]
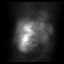
[frame 27/64]
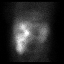
[frame 38/64]
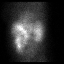
[frame 48/64]
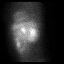
[frame 59/64]
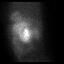

[Series 3: stress gated - perfusion · 6.51mm/px · 6 of 64 frames shown]
[frame 6/64]
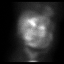
[frame 16/64]
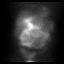
[frame 27/64]
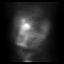
[frame 38/64]
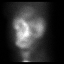
[frame 48/64]
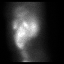
[frame 59/64]
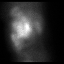

[12 of 12 positions shown; findings below may reference images not displayed]

Canned report from images found in remote index.

Refer to host system for actual result text.

## 2019-10-22 ENCOUNTER — Other Ambulatory Visit: Payer: Self-pay

## 2020-01-14 ENCOUNTER — Other Ambulatory Visit: Payer: Self-pay

## 2020-04-16 IMAGING — US VENOUS DOPPLER ULTRASOUND OF  LOWER EXTREMITIES
1 series · 14 of 24 positions shown · non-contrast
Comparison: None.

CLINICAL DATA: Bilateral lower extremity edema and rash

EXAM:
BILATERAL LOWER EXTREMITY VENOUS DUPLEX ULTRASOUND
TECHNIQUE: Doppler venous assessment of the bilateral lower extremity deep
venous system was performed, including characterization of spectral
flow, compressibility, and phasicity.

[Series 1: venous doppler ultrasound of lower extremities · 14 of 93 slices shown]
[im 1/93]
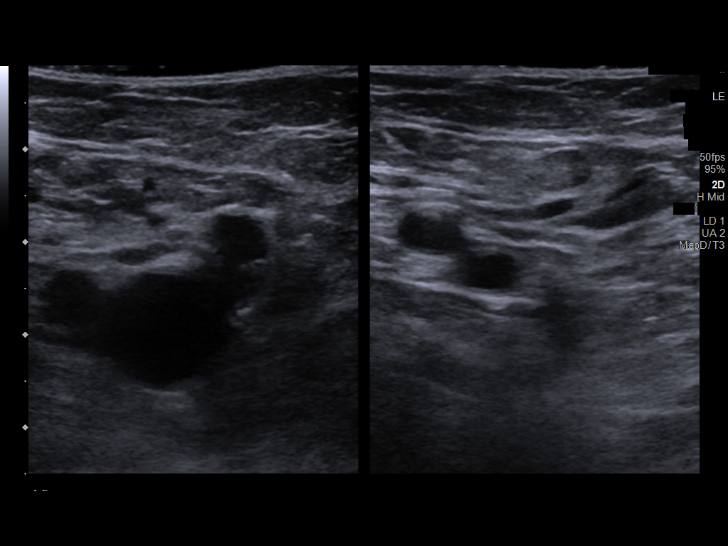
[im 9/93]
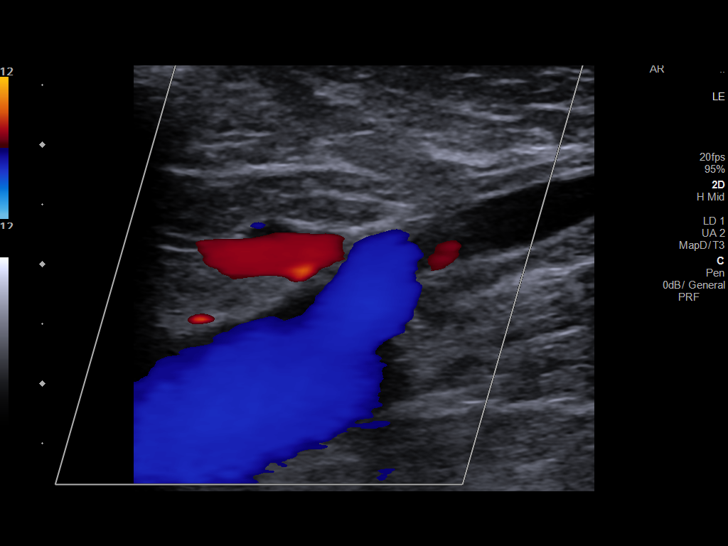
[im 17/93]
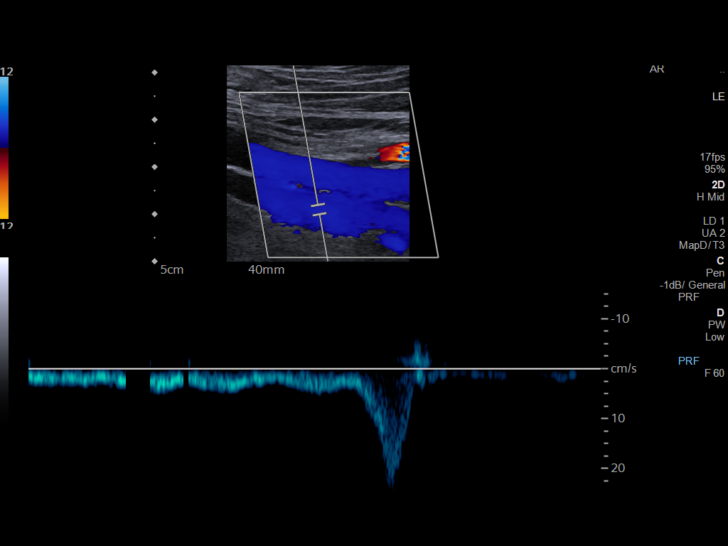
[im 25/93]
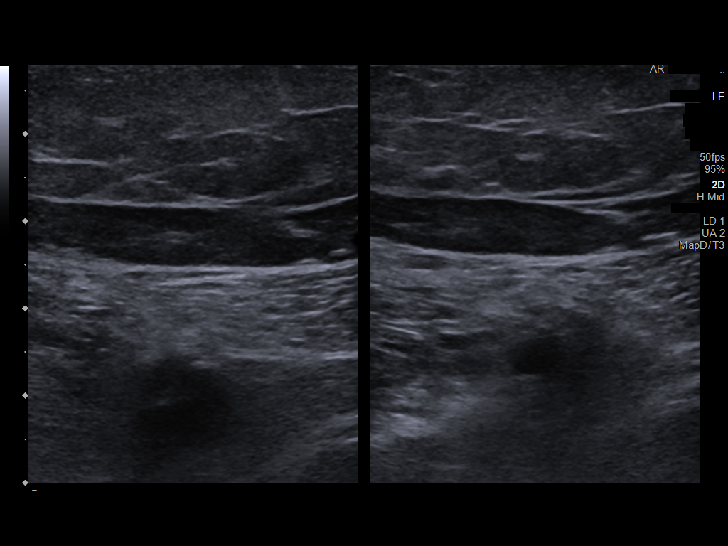
[im 29/93]
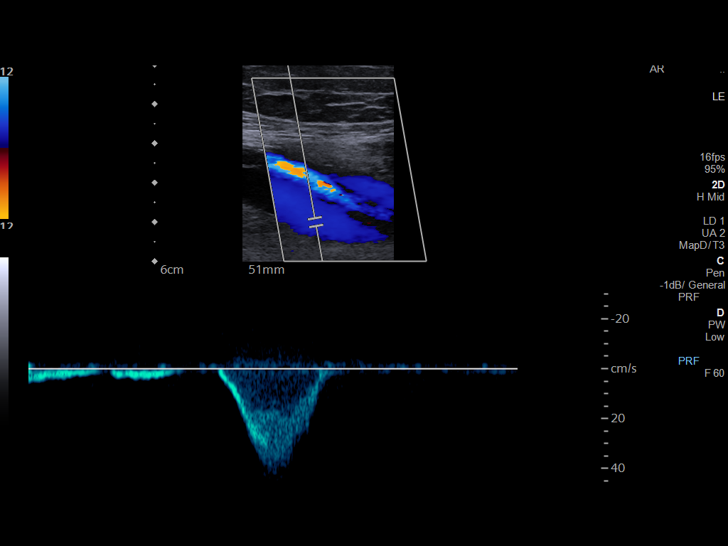
[im 37/93]
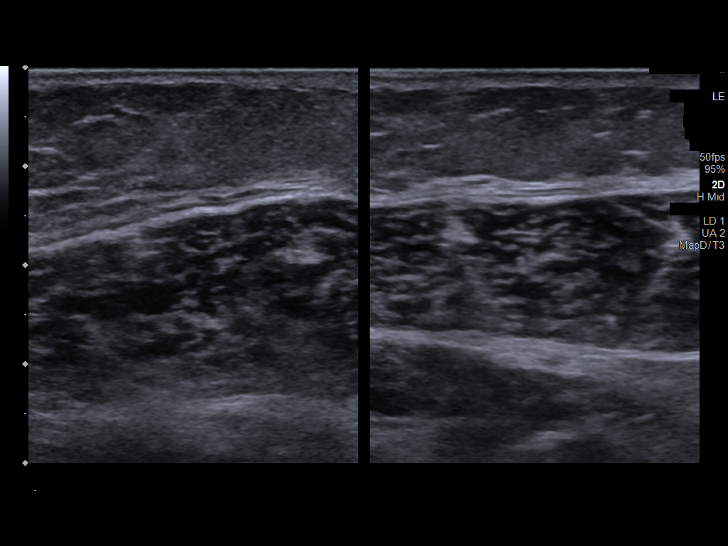
[im 45/93]
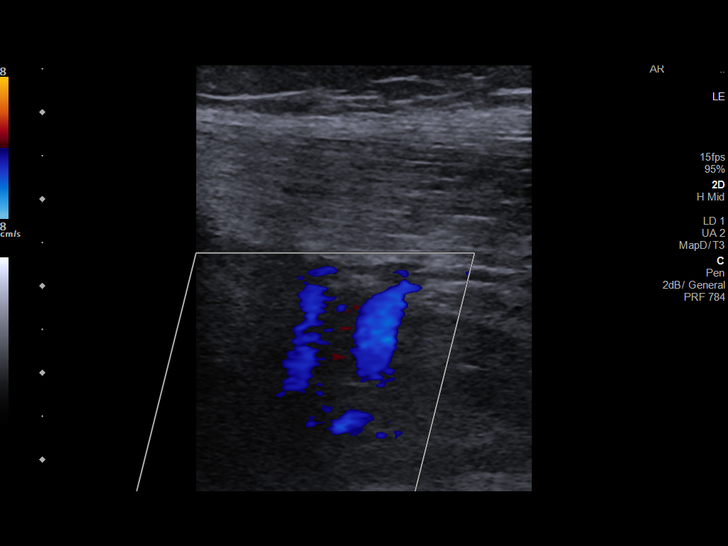
[im 49/93]
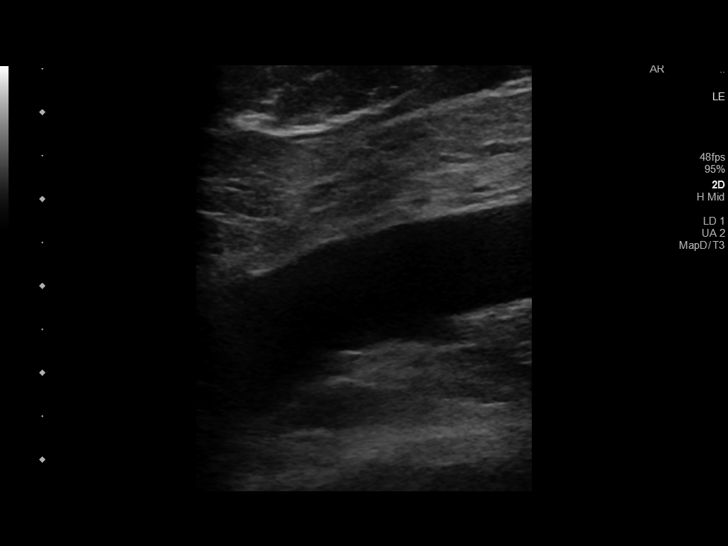
[im 57/93]
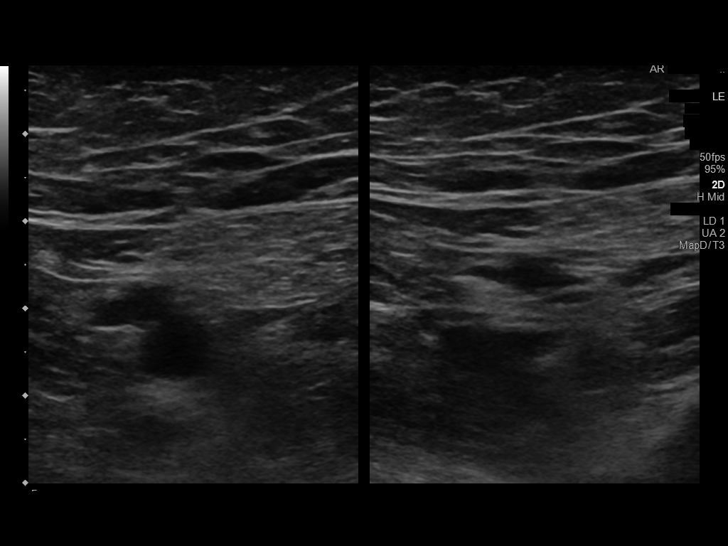
[im 65/93]
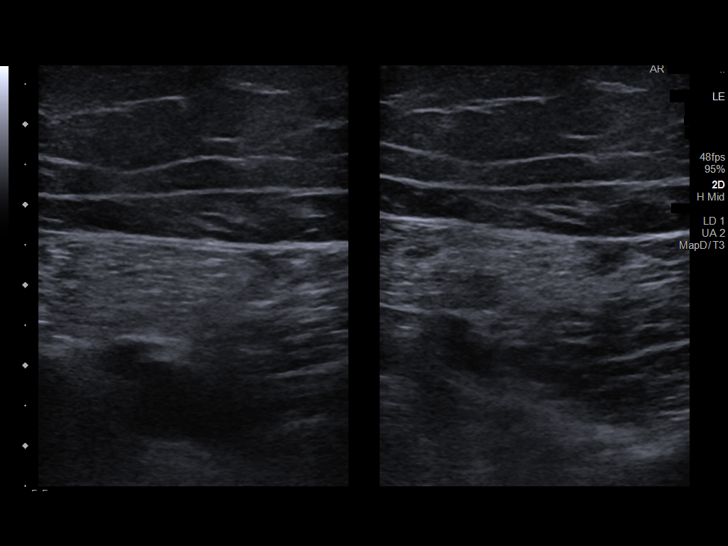
[im 73/93]
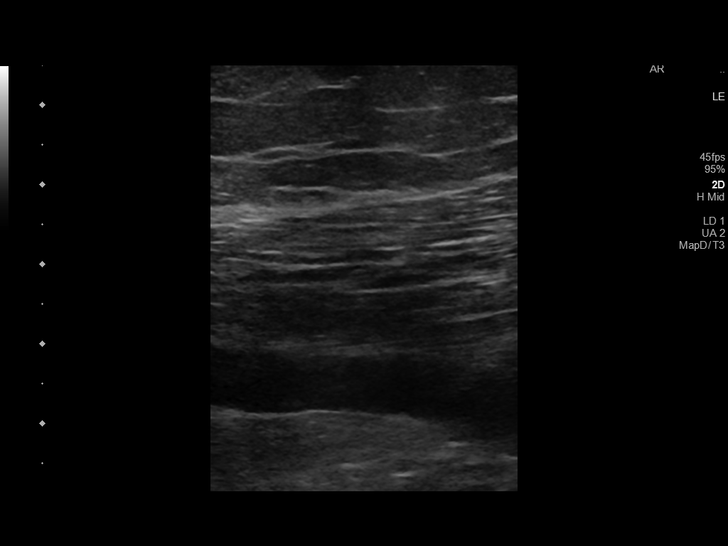
[im 77/93]
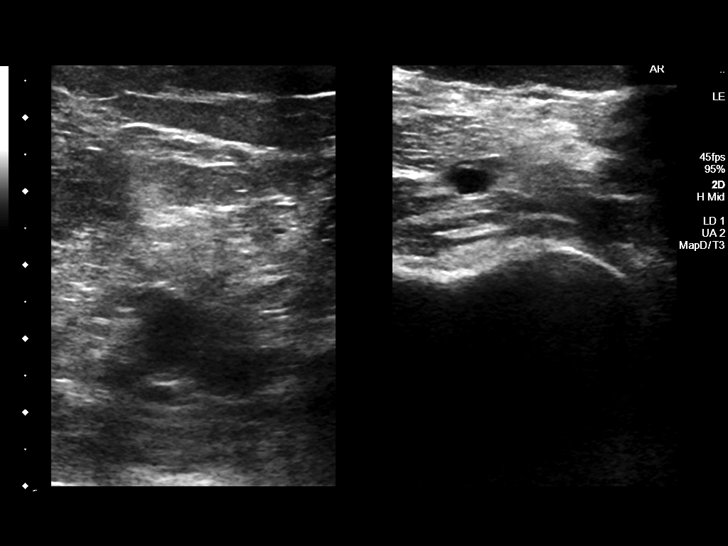
[im 85/93]
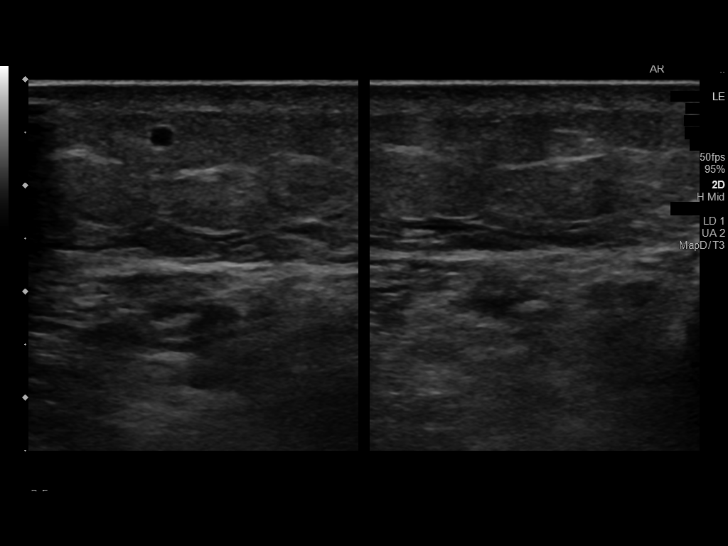
[im 93/93]
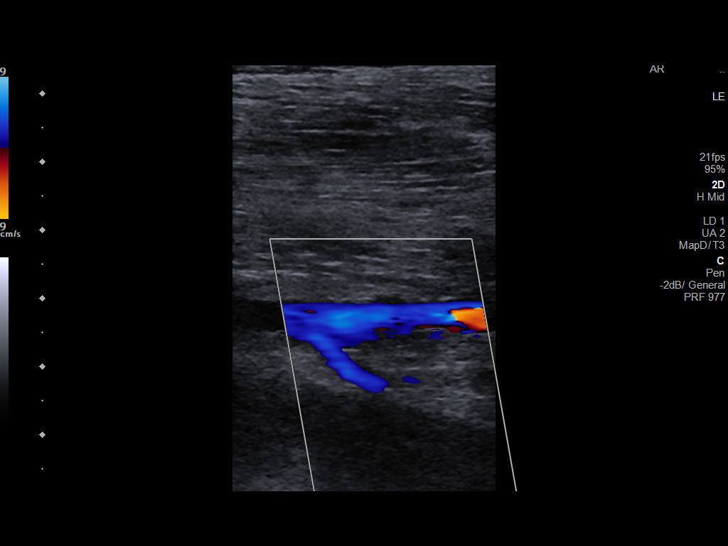

[14 of 24 positions shown; findings below may reference images not displayed]

FINDINGS: There is complete compressibility of the bilateral common femoral,
femoral, and popliteal veins. Doppler analysis demonstrates
respiratory phasicity and augmentation of flow with calf
compression. No obvious superficial vein or calf vein thrombosis.
IMPRESSION: No evidence of lower extremity DVT.

## 2020-11-02 ENCOUNTER — Encounter: Payer: Self-pay | Admitting: *Deleted
